# Patient Record
Sex: Female | Born: 1945
Health system: Southern US, Community
[De-identification: ages and names within clinical notes are randomized; demographics above are authoritative.]

## PROBLEM LIST (undated history)

## (undated) DIAGNOSIS — I779 Disorder of arteries and arterioles, unspecified: Secondary | ICD-10-CM

## (undated) DIAGNOSIS — R112 Nausea with vomiting, unspecified: Secondary | ICD-10-CM

## (undated) DIAGNOSIS — M1990 Unspecified osteoarthritis, unspecified site: Secondary | ICD-10-CM

## (undated) DIAGNOSIS — E039 Hypothyroidism, unspecified: Secondary | ICD-10-CM

## (undated) DIAGNOSIS — E785 Hyperlipidemia, unspecified: Secondary | ICD-10-CM

## (undated) DIAGNOSIS — C50919 Malignant neoplasm of unspecified site of unspecified female breast: Secondary | ICD-10-CM

## (undated) DIAGNOSIS — K219 Gastro-esophageal reflux disease without esophagitis: Secondary | ICD-10-CM

## (undated) DIAGNOSIS — H409 Unspecified glaucoma: Secondary | ICD-10-CM

## (undated) DIAGNOSIS — I739 Peripheral vascular disease, unspecified: Secondary | ICD-10-CM

## (undated) DIAGNOSIS — I2089 Other forms of angina pectoris: Secondary | ICD-10-CM

## (undated) DIAGNOSIS — I10 Essential (primary) hypertension: Secondary | ICD-10-CM

## (undated) DIAGNOSIS — T4145XA Adverse effect of unspecified anesthetic, initial encounter: Secondary | ICD-10-CM

## (undated) DIAGNOSIS — M353 Polymyalgia rheumatica: Secondary | ICD-10-CM

## (undated) HISTORY — DX: Hyperlipidemia, unspecified: E78.5

## (undated) HISTORY — DX: Polymyalgia rheumatica: M35.3

## (undated) HISTORY — DX: Gastro-esophageal reflux disease without esophagitis: K21.9

## (undated) HISTORY — DX: Malignant neoplasm of unspecified site of unspecified female breast: C50.919

## (undated) HISTORY — DX: Unspecified osteoarthritis, unspecified site: M19.90

## (undated) HISTORY — DX: Disorder of arteries and arterioles, unspecified: I77.9

## (undated) HISTORY — DX: Hypothyroidism, unspecified: E03.9

## (undated) HISTORY — DX: Unspecified glaucoma: H40.9

## (undated) HISTORY — DX: Other forms of angina pectoris: I20.89

## (undated) HISTORY — DX: Peripheral vascular disease, unspecified: I73.9

## (undated) HISTORY — DX: Essential (primary) hypertension: I10

## (undated) HISTORY — PX: FINGER SURGERY: SHX640

---
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
## 2000-08-30 HISTORY — PX: ENDOMETRIAL ABLATION: SHX621

## 2003-05-31 ENCOUNTER — Encounter: Payer: Self-pay | Admitting: Family Medicine

## 2003-05-31 ENCOUNTER — Ambulatory Visit (HOSPITAL_COMMUNITY): Admission: RE | Admit: 2003-05-31 | Discharge: 2003-05-31 | Payer: Self-pay | Admitting: Family Medicine

## 2003-09-19 ENCOUNTER — Ambulatory Visit (HOSPITAL_COMMUNITY): Admission: RE | Admit: 2003-09-19 | Discharge: 2003-09-19 | Payer: Self-pay | Admitting: Family Medicine

## 2004-09-15 ENCOUNTER — Ambulatory Visit (HOSPITAL_COMMUNITY): Admission: RE | Admit: 2004-09-15 | Discharge: 2004-09-15 | Payer: Self-pay | Admitting: Family Medicine

## 2005-09-16 ENCOUNTER — Ambulatory Visit (HOSPITAL_COMMUNITY): Admission: RE | Admit: 2005-09-16 | Discharge: 2005-09-16 | Payer: Self-pay | Admitting: Family Medicine

## 2006-06-03 ENCOUNTER — Other Ambulatory Visit: Admission: RE | Admit: 2006-06-03 | Discharge: 2006-06-03 | Payer: Self-pay | Admitting: Obstetrics and Gynecology

## 2006-09-09 ENCOUNTER — Ambulatory Visit (HOSPITAL_COMMUNITY): Admission: RE | Admit: 2006-09-09 | Discharge: 2006-09-09 | Payer: Self-pay | Admitting: Family Medicine

## 2007-02-15 ENCOUNTER — Ambulatory Visit (HOSPITAL_COMMUNITY): Admission: RE | Admit: 2007-02-15 | Discharge: 2007-02-15 | Payer: Self-pay | Admitting: Family Medicine

## 2007-08-08 ENCOUNTER — Other Ambulatory Visit: Admission: RE | Admit: 2007-08-08 | Discharge: 2007-08-08 | Payer: Self-pay | Admitting: Obstetrics and Gynecology

## 2007-08-15 ENCOUNTER — Ambulatory Visit: Payer: Self-pay | Admitting: Internal Medicine

## 2007-08-15 ENCOUNTER — Ambulatory Visit (HOSPITAL_COMMUNITY): Admission: RE | Admit: 2007-08-15 | Discharge: 2007-08-15 | Payer: Self-pay | Admitting: Internal Medicine

## 2007-09-11 ENCOUNTER — Ambulatory Visit (HOSPITAL_COMMUNITY): Admission: RE | Admit: 2007-09-11 | Discharge: 2007-09-11 | Payer: Self-pay | Admitting: Family Medicine

## 2008-08-30 DIAGNOSIS — C50919 Malignant neoplasm of unspecified site of unspecified female breast: Secondary | ICD-10-CM

## 2008-08-30 DIAGNOSIS — Z923 Personal history of irradiation: Secondary | ICD-10-CM

## 2008-08-30 HISTORY — PX: BASAL CELL CARCINOMA EXCISION: SHX1214

## 2008-08-30 HISTORY — DX: Personal history of irradiation: Z92.3

## 2008-08-30 HISTORY — DX: Malignant neoplasm of unspecified site of unspecified female breast: C50.919

## 2008-08-30 HISTORY — PX: BREAST LUMPECTOMY: SHX2

## 2008-09-16 ENCOUNTER — Ambulatory Visit (HOSPITAL_COMMUNITY): Admission: RE | Admit: 2008-09-16 | Discharge: 2008-09-16 | Payer: Self-pay | Admitting: Internal Medicine

## 2008-09-25 ENCOUNTER — Encounter (INDEPENDENT_AMBULATORY_CARE_PROVIDER_SITE_OTHER): Payer: Self-pay | Admitting: Diagnostic Radiology

## 2008-09-25 ENCOUNTER — Encounter: Admission: RE | Admit: 2008-09-25 | Discharge: 2008-09-25 | Payer: Self-pay | Admitting: Internal Medicine

## 2008-10-03 ENCOUNTER — Ambulatory Visit (HOSPITAL_COMMUNITY): Admission: RE | Admit: 2008-10-03 | Discharge: 2008-10-03 | Payer: Self-pay | Admitting: Internal Medicine

## 2008-10-24 ENCOUNTER — Ambulatory Visit (HOSPITAL_COMMUNITY): Admission: RE | Admit: 2008-10-24 | Discharge: 2008-10-24 | Payer: Self-pay | Admitting: General Surgery

## 2008-10-24 ENCOUNTER — Encounter (INDEPENDENT_AMBULATORY_CARE_PROVIDER_SITE_OTHER): Payer: Self-pay | Admitting: General Surgery

## 2008-11-08 ENCOUNTER — Ambulatory Visit (HOSPITAL_COMMUNITY): Payer: Self-pay | Admitting: Oncology

## 2008-11-21 ENCOUNTER — Ambulatory Visit: Admission: RE | Admit: 2008-11-21 | Discharge: 2009-01-23 | Payer: Self-pay | Admitting: Radiation Oncology

## 2009-05-09 ENCOUNTER — Ambulatory Visit (HOSPITAL_COMMUNITY): Payer: Self-pay | Admitting: Oncology

## 2009-08-01 ENCOUNTER — Ambulatory Visit (HOSPITAL_COMMUNITY): Admission: RE | Admit: 2009-08-01 | Discharge: 2009-08-01 | Payer: Self-pay | Admitting: Oncology

## 2009-09-03 ENCOUNTER — Ambulatory Visit (HOSPITAL_COMMUNITY): Admission: RE | Admit: 2009-09-03 | Discharge: 2009-09-03 | Payer: Self-pay | Admitting: Internal Medicine

## 2009-11-04 ENCOUNTER — Encounter (INDEPENDENT_AMBULATORY_CARE_PROVIDER_SITE_OTHER): Payer: Self-pay | Admitting: *Deleted

## 2009-11-04 LAB — CONVERTED CEMR LAB
ALT: 52 units/L
Albumin: 4.4 g/dL
Alkaline Phosphatase: 87 units/L
Chloride: 100 meq/L
Cholesterol: 161 mg/dL
GFR calc non Af Amer: >60 mL/min
Glomerular Filtration Rate, Af Am: >60 mL/min/{1.73_m2}
HCT: 42.6 %
HDL: 51 mg/dL
LDL Cholesterol: 82 mg/dL
MCV: 89.5 fL
Potassium: 4.4 meq/L
Sodium: 139 meq/L
TSH: 1.057 microintl units/mL
Total Protein: 8 g/dL

## 2009-11-25 ENCOUNTER — Ambulatory Visit (HOSPITAL_COMMUNITY): Payer: Self-pay | Admitting: Oncology

## 2009-11-25 ENCOUNTER — Encounter (HOSPITAL_COMMUNITY): Admission: RE | Admit: 2009-11-25 | Discharge: 2009-12-25 | Payer: Self-pay | Admitting: Oncology

## 2009-11-26 DIAGNOSIS — E039 Hypothyroidism, unspecified: Secondary | ICD-10-CM | POA: Insufficient documentation

## 2009-11-26 DIAGNOSIS — I10 Essential (primary) hypertension: Secondary | ICD-10-CM | POA: Insufficient documentation

## 2009-11-27 ENCOUNTER — Ambulatory Visit: Payer: Self-pay | Admitting: Cardiology

## 2009-11-27 ENCOUNTER — Encounter (INDEPENDENT_AMBULATORY_CARE_PROVIDER_SITE_OTHER): Payer: Self-pay | Admitting: *Deleted

## 2009-11-27 DIAGNOSIS — E785 Hyperlipidemia, unspecified: Secondary | ICD-10-CM | POA: Insufficient documentation

## 2009-11-27 DIAGNOSIS — M79609 Pain in unspecified limb: Secondary | ICD-10-CM | POA: Insufficient documentation

## 2009-11-27 DIAGNOSIS — D0590 Unspecified type of carcinoma in situ of unspecified breast: Secondary | ICD-10-CM | POA: Insufficient documentation

## 2009-12-23 ENCOUNTER — Ambulatory Visit (HOSPITAL_COMMUNITY): Admission: RE | Admit: 2009-12-23 | Discharge: 2009-12-23 | Payer: Self-pay | Admitting: Cardiology

## 2009-12-23 ENCOUNTER — Encounter: Payer: Self-pay | Admitting: Cardiology

## 2009-12-23 ENCOUNTER — Ambulatory Visit: Payer: Self-pay | Admitting: Cardiology

## 2009-12-29 ENCOUNTER — Telehealth (INDEPENDENT_AMBULATORY_CARE_PROVIDER_SITE_OTHER): Payer: Self-pay | Admitting: *Deleted

## 2010-01-08 ENCOUNTER — Encounter: Payer: Self-pay | Admitting: Cardiology

## 2010-01-08 LAB — CONVERTED CEMR LAB
ALT: 45 units/L — ABNORMAL HIGH (ref 0–35)
AST: 32 units/L (ref 0–37)
BUN: 19 mg/dL (ref 6–23)
Calcium: 9.4 mg/dL (ref 8.4–10.5)
Total Bilirubin: 0.4 mg/dL (ref 0.3–1.2)

## 2010-01-12 ENCOUNTER — Encounter: Payer: Self-pay | Admitting: Cardiology

## 2010-01-12 ENCOUNTER — Telehealth (INDEPENDENT_AMBULATORY_CARE_PROVIDER_SITE_OTHER): Payer: Self-pay | Admitting: *Deleted

## 2010-01-12 ENCOUNTER — Encounter (INDEPENDENT_AMBULATORY_CARE_PROVIDER_SITE_OTHER): Payer: Self-pay | Admitting: *Deleted

## 2010-01-28 ENCOUNTER — Encounter (INDEPENDENT_AMBULATORY_CARE_PROVIDER_SITE_OTHER): Payer: Self-pay | Admitting: *Deleted

## 2010-01-28 ENCOUNTER — Ambulatory Visit: Payer: Self-pay | Admitting: Cardiology

## 2010-01-28 DIAGNOSIS — R9431 Abnormal electrocardiogram [ECG] [EKG]: Secondary | ICD-10-CM | POA: Insufficient documentation

## 2010-01-28 DIAGNOSIS — E663 Overweight: Secondary | ICD-10-CM | POA: Insufficient documentation

## 2010-01-28 DIAGNOSIS — R252 Cramp and spasm: Secondary | ICD-10-CM | POA: Insufficient documentation

## 2010-01-30 ENCOUNTER — Encounter (INDEPENDENT_AMBULATORY_CARE_PROVIDER_SITE_OTHER): Payer: Self-pay | Admitting: *Deleted

## 2010-01-30 LAB — CONVERTED CEMR LAB
Cholesterol: 157 mg/dL (ref 0–200)
HDL: 42 mg/dL
HDL: 42 mg/dL (ref >39)
Magnesium: 2.2 mg/dL
VLDL: 27 mg/dL (ref 0–40)

## 2010-02-03 ENCOUNTER — Ambulatory Visit: Payer: Self-pay | Admitting: Cardiology

## 2010-02-03 ENCOUNTER — Ambulatory Visit (HOSPITAL_COMMUNITY): Admission: RE | Admit: 2010-02-03 | Discharge: 2010-02-03 | Payer: Self-pay | Admitting: Cardiology

## 2010-02-03 ENCOUNTER — Encounter: Payer: Self-pay | Admitting: Cardiology

## 2010-02-05 ENCOUNTER — Telehealth (INDEPENDENT_AMBULATORY_CARE_PROVIDER_SITE_OTHER): Payer: Self-pay | Admitting: *Deleted

## 2010-02-18 ENCOUNTER — Encounter (INDEPENDENT_AMBULATORY_CARE_PROVIDER_SITE_OTHER): Payer: Self-pay | Admitting: *Deleted

## 2010-03-25 ENCOUNTER — Encounter (INDEPENDENT_AMBULATORY_CARE_PROVIDER_SITE_OTHER): Payer: Self-pay | Admitting: *Deleted

## 2010-03-30 ENCOUNTER — Ambulatory Visit: Payer: Self-pay | Admitting: Cardiology

## 2010-05-08 ENCOUNTER — Ambulatory Visit (HOSPITAL_COMMUNITY): Payer: Self-pay | Admitting: Oncology

## 2010-09-09 ENCOUNTER — Ambulatory Visit (HOSPITAL_COMMUNITY)
Admission: RE | Admit: 2010-09-09 | Discharge: 2010-09-09 | Payer: Self-pay | Source: Home / Self Care | Attending: Internal Medicine | Admitting: Internal Medicine

## 2010-09-20 ENCOUNTER — Encounter: Payer: Self-pay | Admitting: Family Medicine

## 2010-09-20 ENCOUNTER — Encounter: Payer: Self-pay | Admitting: Internal Medicine

## 2010-09-27 LAB — CONVERTED CEMR LAB
Alkaline Phosphatase: 87 units/L
Calcium: 8.9 mg/dL
Chloride: 100 meq/L
Cholesterol: 161 mg/dL
Creatinine, Ser: 0.83 mg/dL
Free T4: 1.29 ng/dL
HCT: 42.6 %
HDL: 51 mg/dL
LDL Cholesterol: 82 mg/dL
MCV: 89.5 fL
Platelets: 307 10*3/uL
TSH: 1.057 microintl units/mL
Triglycerides: 138 mg/dL
WBC: 6.4 10*3/uL

## 2010-09-29 NOTE — Assessment & Plan Note (Signed)
Summary: **PER DR.NEIJSTROM FOR LEFT ARM PAIN/FAMILY HX/TG   Visit Type:  Initial Consult Referring Provider:  Dr. Mariel Sleet Primary Provider:  Sabino Snipes   History of Present Illness: [name] is seen for an initial visit at the kind request of Dr. Mariel Sleet for evaluation of left arm discomfort.  This nice woman has no history of cardiovascular disease other than hypertension, which has been well-controlled.  She has not previously been seen by a cardiologist nor has she undergone any significant cardiac testing.  She generally has good exercise tolerance without exertional dyspnea or chest discomfort.  On 2 occasions, she has noted pain extending down the dorsal surface of the left arm.  This was of moderate intensity.  There were no associated symptoms.  She is a bit unclear as to the details.  She is not certain whether motion of the arm exacerbated the discomfort.  Symptoms resolved spontaneously over the course of an hour or so.  Recent blood tests showed mild elevation of SGOT and SGPT.  Concern has been raised as to whether this represents metastatic carcinoma of the breast.  An imaging study of the abdomen is planned.  Patient is being treated for hyperlipidemia, but is not following a specific diet.  She has been restricting calories in order to lose weight in recent days because she is concerned about the possibility of steatohepatitis.  She has never been instructed in a low-fat diet.     EKG  Procedure date:  13-Dec-2009  Findings:      Normal sinus rhythm Left atrial abnormality Delayed R-wave progression Possible left ventricular hypertrophy No change when compared to a previous tracing of 11/25/09   Current Medications (verified): 1)  Levothroid 112 Mcg Tabs (Levothyroxine Sodium) .... Take 1 Tab Daily 2)  Lisinopril 20 Mg Tabs (Lisinopril) .... Take 1 Tab Daily 3)  Amlodipine Besylate 5 Mg Tabs (Amlodipine Besylate) .... Take 1 Tab Daily 4)  Evista 60 Mg Tabs  (Raloxifene Hcl) .... Take 1 Tab Daily 5)  Fexofenadine Hcl 60 Mg Tabs (Fexofenadine Hcl) .... Take Prn 6)  Vitamin D 400 Unit Tabs (Cholecalciferol) .... Take 1 Tab Daily 7)  Citracal Plus  Tabs (Multiple Minerals-Vitamins) .... Take 1 Tab Daily 8)  Aspir-Low 81 Mg Tbec (Aspirin) .... Take 1 Tab Daily 9)  Systane Preservative Free 0.4-0.3 % Soln (Polyethyl Glycol-Propyl Glycol) .... Use 1 To 3 Times Daily 10)  Azasite 1 % Soln (Azithromycin) .... Use 1 Drop in Both Eyes Once Daily  Allergies (verified): 1)  ! Sulfa 2)  ! Penicillin 3)  ! 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)-excision of tumor followed by radiation therapy in 2010 HYPOTHYROIDISM (ICD-244.9) HYPERTENSION (ICD-401.9) Hyperlipidemia  Past Surgical History: Minor surgical intervention following a crush injury to the left fourth finger Endometrial ablation-2002 Excision of carcinoma of the right breast-2010  Family History: Father died at age 97 due to congestive heart failure Mother died at age 32 with pneumonia and a history of Parkinson's disease  Social History: Retired-previously worked at [employer] with no children No excessive use of alcohol Tobacco-never  Review of Systems       Corrective lenses required  for near and far vision.  Patient has undergone bilateral cataract extraction.  She has a history of colitis.  She notes arthritic discomfort, particularly in the knees and hips.  She has intermittent ankle edema.  All other systems reviewed and are negative.  Vital Signs:  Patient profile:   65 year old  female Height:      65 inches Weight:      189 pounds BMI:     31.56 Pulse rate:   72 / minute BP sitting:   118 / 72  (right arm)  Vitals Entered By: Dreama Saa, CNA (November 27, 2009 1:18 PM)  Physical Exam  General:    Overweight; well-developed; no acute distress: HEENT-[place]/AT; PERRL; EOM intact; conjunctiva and lids nl; plethoric complexion Neck-No JVD; no carotid bruits: Endocrine-No thyromegaly: Lungs-No tachypnea, clear without rales, rhonchi or wheezes: CV-normal PMI; normal S1 and S2; modest systolic ejection murmur  Abdomen-BS normal; soft and non-tender without masses or organomegaly: MS-No deformities, cyanosis or clubbing: Neurologic-Nl cranial nerves; symmetric strength and tone: Skin- Warm, no sig. lesions: Extremities-Nl distal pulses;1+ left ankle edema    Impression & Recommendations:  Problem # 1:  ARM PAIN, LEFT (ICD-729.5) With only 2 episodes of arm discomfort, this is a very nonspecific symptom.  We will proceed with a stress echocardiogram to exclude myocardial ischemia.  Problem # 2:  HYPERTENSION (ICD-401.9) EKG is abnormal only in so far as there is evidence of LVH.  An echocardiogram will be obtained to evaluate this possibility and to rule out segmental or global left ventricular dysfunction.  Problem # 3:  HYPERLIPIDEMIA (ICD-272.4) We are seeking recent laboratories to evaluate the adequacy of control of hyperlipidemia.  Problem # 4:  ELEVATION OF TRANSAMINASES (ICD-790.4) The most likely etiology for her mild elevation of transaminases is treatment with a statin.  Pravastatin will be held for the next 2 months, and a repeat hepatic profile will be obtained in 6 weeks  I will reassess this nice woman at a return office visit in 2 months.  Other Orders: Stress Echo (Stress Echo) Future Orders: T-Comprehensive Metabolic Panel ([id number]) ... 01/08/2010  Patient Instructions: 1)  Your physician recommends that you schedule a follow-up  appointment in: 2  months 2)  Your physician recommends that you return for lab work in: 6 weeks 3)  Your physician has recommended you make the following change in your medication: stop pravachol 4)  Your physician recommends a low cholesterol, low fat diet. Please see [facility] handout. 5)  Your physician has requested that you have a stress echocardiogram. For further information please visit [email or web address]  Please follow instruction sheet as given.

## 2010-09-29 NOTE — Progress Notes (Signed)
Summary: Results  Phone Note Call from Patient   Caller: Patient Reason for Call: Lab or Test Results Summary of Call: pt would like results of labwork, carotids and 2 D echo/tg Initial call taken by: Raechel Ache [facility],  February 05, 2010 10:43 AM  Follow-up for Phone Call        I spoke with pt and gave conclusions to echo and carotid duplex, and labs, pt verbalized understanding.  Also sent letter and copies of all tests per pt request.  Fsxed copies to Dr Ian Malkin Hall,MD Follow-up by: Teressa Lower RN,  February 06, 2010 12:03 PM

## 2010-09-29 NOTE — Letter (Signed)
Summary: [facility] Work Engineer, agricultural at [employer]  [address], [place] [address]   Phone: [phone]  Fax: [phone]     January 28, 2010 MRN: [id number]   Maria Ritter [address] [place], [place]  [address]      YOUR LAB WORK IS DUE   ________FRIDAY JUNE 3, 2011_________________________________  Please go to [facility], located across the street from [facility] on the second floor.  Hours are Monday - Friday 7am until 7:30pm         Saturday 8am until 12noon    _X_  DO NOT EAT OR DRINK AFTER MIDNIGHT EVENING PRIOR TO LABWORK  __ YOUR LABWORK IS NOT FASTING --YOU MAY EAT PRIOR TO LABWORK

## 2010-09-29 NOTE — Miscellaneous (Signed)
Summary: LABS LIPID,MAGNESIUM,01/30/2010  Clinical Lists Changes  Observations: Added new observation of MAGNESIUM: 2.2 mg/dL (16/05/9603 54:09) Added new observation of LDL: 88 mg/dL (81/19/1478 29:56) Added new observation of HDL: 42 mg/dL (21/30/8657 84:69) Added new observation of TRIGLYC TOT: 135 mg/dL (62/95/2841 32:44) Added new observation of CHOLESTEROL: 157 mg/dL (09/01/7251 66:44)

## 2010-09-29 NOTE — Letter (Signed)
Summary: [place] Results Engineer, agricultural at [facility]  [address] [place] [address]   Phone: [phone]  Fax: [phone]      February 18, 2010 MRN: [id number]   Maria Ritter [address] [place], [place]  [address]   Dear [name],  Your test ordered by Selena Batten has been reviewed by your physician (or physician assistant) and was found to be normal or stable. Your physician (or physician assistant) felt no changes were needed at this time.  __x__ Echocardiogram  ____ Cardiac Stress Test  ____ Lab Work  __x__ Peripheral vascular study of arms, legs or neck  ____ CT scan or X-ray  ____ Lung or Breathing test  ____ Other:  No change in medical treatment at this time, per Dr. Dietrich Pates.  Enclosed is a copy of your test for your records per your request.  Thank you, Maven Rosander RN    Mathews Bing, MD, Lenise Arena.C.Gaylord Shih, MD, F.A.C.C Lewayne Bunting, MD, F.A.C.C Nona Dell, MD, F.A.C.C Charlton Haws, MD, Lenise Arena.C.C

## 2010-09-29 NOTE — Assessment & Plan Note (Signed)
Summary: 2 mth f/u per checkout on 11/27/09/tg   Visit Type:  Follow-up Referring Provider:  Dr. Mariel Sleet Primary Provider:  Sabino Snipes   History of Present Illness: Return visit for this pleasant 65 year old woman initially referred to me for evaluation of left arm discomfort.  She has hypertension and hyperlipidemia but no known vascular disease.  She has done well since her last visit, losing nearly 20 pounds due to caloric restriction and experiencing no further symptoms suggestive of cardiovascular disease.  Ms. Kirstie Peri describes a number of unrelated symptoms.  She notes leg cramps nocturnally and wonders whether quinine or quinidine treatment would be appropriate.  She has developed a rash over the right forearm.  Current Medications (verified): 1)  Levothroid 112 Mcg Tabs (Levothyroxine Sodium) .... Take 1 Tab Daily 2)  Lisinopril 20 Mg Tabs (Lisinopril) .... Take 1 Tab Daily 3)  Amlodipine Besylate 5 Mg Tabs (Amlodipine Besylate) .... Take 1 Tab Daily 4)  Evista 60 Mg Tabs (Raloxifene Hcl) .... Take 1 Tab Daily 5)  Fexofenadine Hcl 60 Mg Tabs (Fexofenadine Hcl) .... Take Prn 6)  Vitamin D 400 Unit Tabs (Cholecalciferol) .... Take 1 Tab Daily 7)  Citracal Plus  Tabs (Multiple Minerals-Vitamins) .... Take 1 Tab Daily 8)  Aspir-Low 81 Mg Tbec (Aspirin) .... Take 1 Tab Daily 9)  Systane Preservative Free 0.4-0.3 % Soln (Polyethyl Glycol-Propyl Glycol) .... Use 1 To 3 Times Daily 10)  Benadryl Maximum Strength 2 % Crea (Diphenhydramine Hcl) .... Use Prn  Allergies (verified): 1)  ! Sulfa 2)  ! Penicillin 3)  ! Erythromycin  Past History:  PMH, FH, and Social History reviewed and updated.  Past Medical History: Left arm discomfort ?  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  The patient denies chest pain, syncope, dyspnea on exertion, peripheral edema, prolonged cough, headaches, abdominal pain, and hematochezia.    Vital Signs:  Patient profile:   65 year old female Weight:      172 pounds Pulse rate:   64 / minute BP sitting:   110 / 64  (right arm)  Vitals Entered By: Dreama Saa, CNA (January 28, 2010 11:20 AM)  Physical Exam  General:    Overweight; well-developed; no acute distress: Neck-No JVD; no carotid bruits: Endocrine-No thyromegaly: Lungs-No tachypnea, clear without rales, rhonchi or wheezes: CV-normal PMI; normal S1 and S2; modest systolic ejection murmur  Abdomen-BS normal; soft and non-tender without masses or organomegaly: MS-No deformities, cyanosis or clubbing: Neurologic-Nl cranial nerves; symmetric strength and tone: Skin- Warm, maculopapular rash over the right forearm with minor excoriations Extremities-Nl distal pulses;trace left ankle edema    Echocardiogram  Procedure date:  12/23/2009  Findings:       CONCLUSION:   1. Negative exercise treadmill test by standard criteria at a maximum       workload of 10.1 METs.  No chest pain reported.  Peak blood       pressure 148/72.  No inducible arrhythmias.   2. Technically nondiagnostic echocardiographic findings.  Left       ventricular systolic function is normal at rest and there are no       clearly inducible wall motion abnormalities, however, the peak       images were obtained at submaximal heart rate in  light of rapid       reduction in heart rate from peak.               Jonelle Sidle, MD      Impression & Recommendations:  Problem # 1:  ? CEREBROVASCULAR DISEASE (ICD-437.9) The quality of her previous vascular study is uncertain.  If she does have cerebrovascular disease, aggressive therapy to lower serum lipids will definitely be appropriate.  Accordingly, a carotid duplex study will  be obtained.  Problem # 2:  ELEVATION OF TRANSAMINASES (ICD-790.4) LFT abnormalities are improved, now with a normal SGOT and minimal elevation of SGPT.  This suggests that treatment with a statin may well be the problem.  I see no imaging studies of her liver.  An ultrasound or CT scan, if negative, would be reassuring.  The decision whether or not to restart a statin will be based upon her carotid ultrasound study and fasting lipid profile off treatment.  Problem # 3:  NONSPECIFIC SKIN ERUPTION (ICD-782.1) Patient has a dermatitis over the right forearm that developed while she was mowing a heavily vegetated area.  She believes there was a central insect sting, but contact dermatitis is certainly a possibility.  She will use over-the-counter steroid cream.  Problem # 4:  ARM PAIN, LEFT (ICD-729.5) Stress echocardiogram was negative, suggesting the absence of coronary disease.  Since she is currently asymptomatic, no further testing is warranted.  Problem # 5:  HYPERLIPIDEMIA (ICD-272.4) Lipid profile obtained from Dr. Scharlene Gloss office was likely taken on drug treatment and showed total cholesterol 161, triglycerides of 138, HDL 51 and LDL of 82.  We will compare a fasting lipid profile taken now after refraining from treatment for one month.  Problem # 6:  LEG CRAMPS, NOCTURNAL (ICD-729.82) Exercises were provided to the patient for this problem.  Stretching of the gastrocnemius prior to retiring for bed has been reported to be beneficial.  A magnesium level will be checked.  Patient was assured that these symptoms are likely benign.  Problem # 7:  HYPERTENSION (ICD-401.9) Echocardiogram was inadvertently not obtained and will be reordered.  This will assess the degree of LVH and rule out other structural heart disease.  I will reassess this nice woman in 2 months.  Problem # 8:  OVERWEIGHT (ICD-278.02) Patient congratulated on achieving a 17 pound weight loss.  Other Orders: Carotid Duplex  (Carotid Duplex) 2-D Echocardiogram (2D Echo) Future Orders: T-Lipid Profile ([id number]) ... 01/30/2010 T-Magnesium [phone]) ... 01/30/2010  Patient Instructions: 1)  Your physician recommends that you schedule a follow-up appointment in: 2 MONTHS 2)  Your physician recommends that you return for lab work in: FRIDAY 3)  Your physician has requested that you have a carotid duplex. This test is an ultrasound of the carotid arteries in your neck. It looks at blood flow through these arteries that supply the brain with blood. Allow one hour for this exam. There are no restrictions or special instructions. 4)  Your physician has requested that you have an echocardiogram.  Echocardiography is a painless test that uses sound waves to create images of your heart. It provides your doctor with information about the size and shape of your heart and how well your heart's chambers and valves are working.  This procedure takes approximately one hour. There are no restrictions for this procedure.

## 2010-09-29 NOTE — Progress Notes (Signed)
Summary: lab results  Phone Note Call from Patient   Caller: Patient Reason for Call: Lab or Test Results Summary of Call: pt wants lab results/states to pls call after 12 noon/tg Initial call taken by: Raechel Ache [facility],  Jan 12, 2010 8:07 AM  Follow-up for Phone Call        results called to pt, faxed to Dr. Scharlene Gloss office and a letter and copy of labwork was mailed to pt Follow-up by: Teressa Lower RN,  Jan 12, 2010 12:23 PM

## 2010-09-29 NOTE — Letter (Signed)
Summary:  Results Engineer, agricultural at [facility]  [address], [place] [address]   Phone: [phone]  Fax: [phone]      Jan 12, 2010 MRN: [id number]   Maria Ritter [address] [place], [place]  [address]   Dear [name],  Your test ordered by Selena Batten has been reviewed by your physician (or physician assistant) and was found to be normal or stable. Your physician (or physician assistant) felt no changes were needed at this time.  ____ Echocardiogram  ____ Cardiac Stress Test  __X__ Lab Work  ____ Peripheral vascular study of arms, legs or neck  ____ CT scan or X-ray  ____ Lung or Breathing test  ____ Other: Please continue on current medical treatment.  Thank you.  Silverton Bing, MD, F.A.C.C

## 2010-09-29 NOTE — Miscellaneous (Signed)
Summary: CAROTID 02/03/2010,ECHO 02/03/2010   Clinical Lists Changes  Observations: Added new observation of ECHOINTERP:   Study Conclusions    - Left ventricle: The cavity size was normal. There was mild focal     basal hypertrophy of the septum. Systolic function was normal. The     estimated ejection fraction was in the range of 55% to 60%. Wall     motion was normal; there were no regional wall motion     abnormalities.   - Mitral valve: Calcified annulus.   - Pulmonary arteries: PA peak pressure: 31mm Hg (S).   Transthoracic echocardiography. M-mode, complete 2D, spectral   Doppler, and color Doppler. Height: Height: 165.1cm. Height: 65in.   Weight: Weight: 77.1kg. Weight: 169.6lb. Body mass index: BMI:   28.3kg/m^2. Body surface area: BSA: 1.54m^2. Patient status:   Outpatient. Location: Echo laboratory.    --------------------------------------------------------------------  (02/03/2010 17:23) Added new observation of US CAROTID:  Clinical Data: Carotid atherosclerosis by a screening exam    BILATERAL CAROTID DUPLEX ULTRASOUND    Technique: Gray scale imaging, color Doppler and duplex ultrasound   was performed of bilateral carotid and vertebral arteries in the   neck.    Comparison: None.    Criteria:  Quantification of carotid stenosis is based on velocity   parameters that correlate the residual internal carotid diameter   with NASCET-based stenosis levels, using the diameter of the distal   internal carotid lumen as the denominator for stenosis measurement.    The following velocity measurements were obtained:                     PEAK SYSTOLIC/END DIASTOLIC   RIGHT   ICA:                        80/30cm/sec   CCA:                        77/13cm/sec   SYSTOLIC ICA/CCA RATIO:     1.16   DIASTOLIC ICA/CCA RATIO:    1.85   ECA:                        92/11cm/sec    LEFT   ICA:                        122/46cm/sec   CCA:                        77/16cm/sec   SYSTOLIC  ICA/CCA RATIO:     1.48   DIASTOLIC ICA/CCA RATIO:    3.04   ECA:                        76/12cm/sec    Findings:    RIGHT CAROTID ARTERY: Very minimal right carotid bifurcation   intimal thickening and atherosclerosis.  No hemodynamically   significant ICA stenosis, velocity elevation, or turbulent flow.    RIGHT VERTEBRAL ARTERY:  Antegrade    LEFT CAROTID ARTERY: Very minimal left carotid bifurcation   atherosclerosis and intimal thickening.  No hemodynamically   significant ICA stenosis, velocity elevation, or turbulent flow.    LEFT VERTEBRAL ARTERY:  Antegrade    IMPRESSION:   Minimal carotid atherosclerosis.  No hemodynamically significant   ICA stenosis on either side    Read By:  Sigurd Sos.,  M.D.   Released By:  Sigurd Sos.,  M.D.  Additional Information  HL7 RESULT STATUS : F  External image : [id number]  External IF Update Timestamp : 2010-02-03:10:27:38.000000  (01/30/2010 17:22)      Carotid Doppler  Procedure date:  01/30/2010  Findings:       Clinical Data: Carotid atherosclerosis by a screening exam    BILATERAL CAROTID DUPLEX ULTRASOUND    Technique: Wallace Cullens scale imaging, color Doppler and duplex ultrasound   was performed of bilateral carotid and vertebral arteries in the   neck.    Comparison: None.    Criteria:  Quantification of carotid stenosis is based on velocity   parameters that correlate the residual internal carotid diameter   with NASCET-based stenosis levels, using the diameter of the distal   internal carotid lumen as the denominator for stenosis measurement.    The following velocity measurements were obtained:                     PEAK SYSTOLIC/END DIASTOLIC   RIGHT   ICA:                        80/30cm/sec   CCA:                        77/13cm/sec   SYSTOLIC ICA/CCA RATIO:     1.16   DIASTOLIC ICA/CCA RATIO:    1.85   ECA:                        92/11cm/sec    LEFT   ICA:                         122/46cm/sec   CCA:                        77/16cm/sec   SYSTOLIC ICA/CCA RATIO:     1.48   DIASTOLIC ICA/CCA RATIO:    3.04   ECA:                        76/12cm/sec    Findings:    RIGHT CAROTID ARTERY: Very minimal right carotid bifurcation   intimal thickening and atherosclerosis.  No hemodynamically   significant ICA stenosis, velocity elevation, or turbulent flow.    RIGHT VERTEBRAL ARTERY:  Antegrade    LEFT CAROTID ARTERY: Very minimal left carotid bifurcation   atherosclerosis and intimal thickening.  No hemodynamically   significant ICA stenosis, velocity elevation, or turbulent flow.    LEFT VERTEBRAL ARTERY:  Antegrade    IMPRESSION:   Minimal carotid atherosclerosis.  No hemodynamically significant   ICA stenosis on either side    Read By:  Sigurd Sos.,  M.D.   Released By:  Sigurd Sos.,  M.D.  Additional Information  HL7 RESULT STATUS : F  External image : [id number]  External IF Update Timestamp : 2010-02-03:10:27:38.000000   Echocardiogram  Procedure date:  02/03/2010  Findings:        Study Conclusions    - Left ventricle: The cavity size was normal. There was mild focal     basal hypertrophy of the septum. Systolic function was normal. The     estimated ejection fraction was in the range of 55% to 60%. Wall  motion was normal; there were no regional wall motion     abnormalities.   - Mitral valve: Calcified annulus.   - Pulmonary arteries: PA peak pressure: 31mm Hg (S).   Transthoracic echocardiography. M-mode, complete 2D, spectral   Doppler, and color Doppler. Height: Height: 165.1cm. Height: 65in.   Weight: Weight: 77.1kg. Weight: 169.6lb. Body mass index: BMI:   28.3kg/m^2. Body surface area: BSA: 1.45m^2. Patient status:   Outpatient. Location: Echo laboratory.    --------------------------------------------------------------------

## 2010-09-29 NOTE — Letter (Signed)
Summary: [name] Results Engineer, agricultural at [facility]  [address] [place] [address]   Phone: [phone]  Fax: [phone]      Jan 12, 2010 MRN: [id number]   Maria Ritter [address] [place], [place]  [address]   Dear [name],  Your test ordered by Selena Batten has been reviewed by your physician (or physician assistant) and was found to be normal or stable. Your physician (or physician assistant) felt no changes were needed at this time.  ____ Echocardiogram  ____ Cardiac Stress Test  __x__ Lab Work  ____ Peripheral vascular study of arms, legs or neck  ____ CT scan or X-ray  ____ Lung or Breathing test  ____ Other:  No change in medical treatment at this time, per Dr. Dietrich Pates.  Enclosed is a copy of your labwork for your records.  Thank you, Marlys Stegmaier Allyne Gee RN    [place] Bing, MD, Lenise Arena.C.Gaylord Shih, MD, F.A.C.C Lewayne Bunting, MD, F.A.C.C Nona Dell, MD, F.A.C.C Charlton Haws, MD, Lenise Arena.C.C

## 2010-09-29 NOTE — Progress Notes (Signed)
Summary: test results  Phone Note Call from Patient Call back at Home Phone [phone]   Reason for Call: Talk to Nurse Summary of Call: S: pt calling for echo results that were done about a week ago. Initial call taken by: Faythe Ghee,  Dec 29, 2009 10:15 AM  Follow-up for Phone Call        gave pt her echo results , verbalized understanding Follow-up by: Teressa Lower RN,  Dec 29, 2009 3:08 PM

## 2010-09-29 NOTE — Miscellaneous (Signed)
Summary: labs cbcd,lipid,11/04/2009  Clinical Lists Changes  Observations: Added new observation of CALCIUM: 8.9 mg/dL (02/54/2706 23:76) Added new observation of ALBUMIN: 4.4 g/dL (28/31/5176 16:07) Added new observation of PROTEIN, TOT: 8.0 g/dL (37/05/6268 48:54) Added new observation of SGPT (ALT): 52 units/L (11/04/2009 13:04) Added new observation of SGOT (AST): 55 units/L (11/04/2009 13:04) Added new observation of ALK PHOS: 87 units/L (11/04/2009 13:04) Added new observation of GFR AA: >60 mL/min/1.25m2 (11/04/2009 13:04) Added new observation of GFR: >60 mL/min (11/04/2009 13:04) Added new observation of CREATININE: 0.83 mg/dL (62/70/3500 93:81) Added new observation of BUN: 17 mg/dL (82/99/3716 96:78) Added new observation of BG RANDOM: 100 mg/dL (93/81/0175 10:25) Added new observation of CO2 PLSM/SER: 30 meq/L (11/04/2009 13:04) Added new observation of CL SERUM: 100 meq/L (11/04/2009 13:04) Added new observation of K SERUM: 4.4 meq/L (11/04/2009 13:04) Added new observation of NA: 139 meq/L (11/04/2009 13:04) Added new observation of LDL: 82 mg/dL (85/27/7824 23:53) Added new observation of HDL: 51 mg/dL (61/44/3154 00:86) Added new observation of TRIGLYC TOT: 138 mg/dL (76/19/5093 26:71) Added new observation of CHOLESTEROL: 161 mg/dL (24/58/0998 33:82) Added new observation of PLATELETK/UL: 307 K/uL (11/04/2009 13:04) Added new observation of MCV: 89.5 fL (11/04/2009 13:04) Added new observation of HCT: 42.6 % (11/04/2009 13:04) Added new observation of HGB: 13.5 g/dL (50/53/9767 34:19) Added new observation of WBC COUNT: 6.4 10*3/microliter (11/04/2009 13:04) Added new observation of TSH: 1.057 microintl units/mL (11/04/2009 13:04) Added new observation of T4, FREE: 1.29 ng/dL (37/90/2409 73:53)

## 2010-09-29 NOTE — Assessment & Plan Note (Signed)
Summary: f77m   Visit Type:  Follow-up Referring Provider:  Dr. Mariel Sleet Primary Provider:  Sabino Snipes   History of Present Illness: [name] returns to the office as scheduled for continued assessment and treatment of hypertension, hyperlipidemia and evaluation of possible vascular disease.  Since her last visit, she has done quite well.  She continues on her weight reduction diet with good effect.  Blood pressure control has been excellent.  She has had no recurrent left arm discomfort, no chest discomfort and and no dyspnea.  Her principle complaints today are noncardiac.  She has had chronic diffuse pruritus and wonders whether this could be a drug allergy.  There have been no associated skin lesions.  She also has a sense of irritation around her eyes, perhaps with some swelling.  She has apparently been treated as if these were allergic reactions in the past with antihistamines.  Current Medications (verified): 1)  Levothroid 112 Mcg Tabs (Levothyroxine Sodium) .... Take 1 Tab Daily 2)  Lisinopril 20 Mg Tabs (Lisinopril) .... Take 1 Tab Daily 3)  Amlodipine Besylate 5 Mg Tabs (Amlodipine Besylate) .... Take 1 Tab Daily 4)  Evista 60 Mg Tabs (Raloxifene Hcl) .... Take 1 Tab Daily 5)  Fexofenadine Hcl 60 Mg Tabs (Fexofenadine Hcl) .... Take Prn 6)  Vitamin D 400 Unit Tabs (Cholecalciferol) .... Take 1 Tab Daily 7)  Citracal Plus  Tabs (Multiple Minerals-Vitamins) .... Take 1 Tab Daily 8)  Aspir-Low 81 Mg Tbec (Aspirin) .... Take 1 Tab Daily 9)  Systane Preservative Free 0.4-0.3 % Soln (Polyethyl Glycol-Propyl Glycol) .... Use 1 To 3 Times Daily 10)  Benadryl Maximum Strength 2 % Crea (Diphenhydramine Hcl) .... Use Prn 11)  Sm Eye Drops 0.05-0.1-1-1 % Soln (Tetrahydroz-Dextran-Peg-Povid) .Marland Kitchen.. 1 Drop Each Eye 2 To 3 Times Daily 12)  Azasite 1 % Soln (Azithromycin) .... Use 1 Drop Each Eye Daily  Allergies (verified): 1)  ! Sulfa 2)  ! Penicillin 3)  !  Erythromycin  Past History:  PMH, FH, and Social History reviewed and updated.  Past Medical History: Left arm discomfort ?  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  Vital Signs:  Patient profile:   65 year old female Weight:      166 pounds Pulse rate:   76 / minute BP sitting:   104 / 63  (right arm)  Vitals Entered By: Dreama Saa, CNA (March 30, 2010 12:53 PM)  Physical Exam  General:  Mildly overweight; well-developed; no acute distress: Neck-No JVD; no carotid bruits: Lungs-No tachypnea, clear without rales, rhonchi or wheezes: CV-normal PMI; normal S1 and S2; S4 present  Abdomen-BS normal; soft and non-tender without masses or organomegaly: MS-No deformities, cyanosis or clubbing: Neurologic-Nl cranial nerves; symmetric strength and tone: Skin- Warm, minor ecchymoses over both arms Extremities-Nl distal pulses;trace ankle edema    Impression & Recommendations:  Problem # 1:  ? of CEREBROVASCULAR DISEASE (ICD-437.9) Carotid ultrasound was essentially negative.  Minimal atherosclerotic changes of the carotids were described.  No further evaluation or treatment is warranted.  Problem # 2:  ELEVATION OF TRANSAMINASES (ICD-790.4) Resolved and likely related to treatment with a statin.  When statin therapy required, minor elevations of transaminases are acceptable as long as they remain  less than 3 times the upper limit of normal.  Problem # 3:  HYPERLIPIDEMIA (ICD-272.4) Repeat lipid profile off drug was quite good with total cholesterol of 157, triglycerides 135, HDL 42 and LDL of 88.  No pharmacological lipid-lowering therapy is warranted.  There  may have been some improvement related to weight loss and a change in diet.  [name] was congratulated on achieving both of these goals.  Problem # 4:  HYPERTENSION (ICD-401.9) Blood pressure control has been excellent.  This problem may also have improved as a result of patient's weight loss.  I suggested that she discontinue amlodipine to determine whether to drug therapy is necessary.  She preferred to discuss this with you before initiating a trial off of drug.  Her echocardiogram showed mild hypertrophy limited to the basilar septum and was otherwise normal.  [name] is doing extremely well.  I see no need for continued cardiology followup.  Please let me know if I can be of service to this nice woman at any time in the future.  Patient Instructions: 1)  Your physician recommends that you schedule a follow-up appointment in: as needed

## 2010-09-29 NOTE — Letter (Signed)
Summary: Stress Echocardiogram Information Sheet  [facility]  [address], [place] [address]   Phone: [phone]  Fax: [phone]      November 27, 2009 MRN: [id number] light prior to the test.   Maria Ritter  Doctor: Appointment Date: Appointment Time: Appointment Location: [facility]  Stress Echocardiogram Information Sheet    Instructions:   1. DO NOT  take your LISINOPRIL,AMLODIPINE,OR FEXOFENADINE  medicine MORNING OF YOUR TEST.  2. NOTHING TO EAT OR DRINK AFTER MIDNIGHT  3. Dress prepared to exercise.  4. DO NOT use ANY caffine or tobacco products 3 hours before appointment.  5. Report to the Short Stay Center on the1st floor.  6. Please bring all current prescription medications.  7. If you have any questions, please call [phone]

## 2010-09-29 NOTE — Letter (Signed)
Summary: [facility] [employer]  [address] [place] [address]   Phone: [phone]  Fax: [phone]     November 27, 2009 MRN: [id number]   Maria Ritter [address] [place], [place]  [address]      YOUR LAB WORK IS DUE   ___________MAY 12, 2011______________________________  Please go to [facility], located across the street from [facility] on the second floor.  Hours are Monday - Friday 7am until 7:30pm         Saturday 8am until 12noon    __  DO NOT EAT OR DRINK AFTER MIDNIGHT EVENING PRIOR TO LABWORK  _X_ YOUR LABWORK IS NOT FASTING --YOU MAY EAT PRIOR TO LABWORK

## 2010-12-15 LAB — CBC
Hemoglobin: 13.3 g/dL (ref 12.0–15.0)
MCV: 88.4 fL (ref 78.0–100.0)
Platelets: 283 10*3/uL (ref 150–400)
WBC: 5.5 10*3/uL (ref 4.0–10.5)

## 2010-12-15 LAB — COMPREHENSIVE METABOLIC PANEL
ALT: 25 U/L (ref 0–35)
AST: 24 U/L (ref 0–37)
Albumin: 4.1 g/dL (ref 3.5–5.2)
Alkaline Phosphatase: 99 U/L (ref 39–117)
CO2: 33 mEq/L — ABNORMAL HIGH (ref 19–32)
Chloride: 100 mEq/L (ref 96–112)
GFR calc Af Amer: >60 mL/min (ref >60)
GFR calc non Af Amer: >60 mL/min (ref >60)
Potassium: 4.2 mEq/L (ref 3.5–5.1)
Sodium: 140 mEq/L (ref 135–145)
Total Bilirubin: 0.4 mg/dL (ref 0.3–1.2)

## 2010-12-15 LAB — CREATININE, SERUM: GFR calc non Af Amer: >60 mL/min (ref >60)

## 2010-12-17 ENCOUNTER — Encounter (HOSPITAL_COMMUNITY): Payer: Medicare HMO | Attending: Oncology

## 2010-12-17 DIAGNOSIS — C50919 Malignant neoplasm of unspecified site of unspecified female breast: Secondary | ICD-10-CM

## 2010-12-18 ENCOUNTER — Ambulatory Visit (HOSPITAL_COMMUNITY): Payer: Self-pay | Admitting: Oncology

## 2011-01-12 NOTE — H&P (Signed)
Maria Ritter, KLUTTS            ACCOUNT NO.:  [id number]   MEDICAL RECORD NO.:  [id number]         PATIENT TYPE:  PAMB   LOCATION:  DAY                           FACILITY:  [facility]   PHYSICIAN:  Dalia Heading, M.D.  DATE OF BIRTH:  1946/08/27   DATE OF ADMISSION:  DATE OF DISCHARGE:  LH                              HISTORY & PHYSICAL   CHIEF COMPLAINT:  Right breast carcinoma, stage 0.   HISTORY OF PRESENT ILLNESS:  The patient is a 64 year old white female  who is referred for evaluation and treatment of the right breast  carcinoma.  This was found on routine mammography and biopsy-proven to  be ductal carcinoma in situ.  MRI of the breasts were negative for  metastatic disease.  There is no family history of breast carcinoma or  nipple discharge.   PAST MEDICAL HISTORY:  Includes hypothyroidism and hypertension.   PAST SURGICAL HISTORY:  Left ring finger surgery and uterine ablation.   CURRENT MEDICATIONS:  1. Baby aspirin, which she is holding.  2. Levothyroxine 137 mcg p.o. daily.  3. Sular 8.5 mg p.o. every week.  4. Lisinopril 20 mg p.o. daily.  5. Pravastatin 40 mg p.o. daily.  6. Vitamin D supplements.   ALLERGIES:  SULFA, PENICILLIN, and ERYTHROMYCIN.   REVIEW OF SYSTEMS:  Noncontributory.   PHYSICAL EXAMINATION:  GENERAL:  The patient is a well developed and  well nourished white female, in no acute distress.  NECK:  Supple without lymphadenopathy  LUNGS:  Clear to auscultation with equal breath sounds bilaterally.  HEART:  Examination reveals regular rate and rhythm without S3, S4, or  murmurs.  BREASTS:  Right breast examination reveals no dominant mass, nipple  discharge, or dimpling.  The axilla is negative for palpable nodes.  Left breast examination reveals no dominant mass, nipple discharge, or  dimpling.  The axilla is negative for palpable nodes.   Mammography and MRI of the breast revealed a 2.7 cm of malignancy in the  upper, outer quadrant.  She  is ER/PR positive.   IMPRESSION:  Right breast carcinoma, stage 0.   PLAN:  The patient has elected to proceed with a right partial  mastectomy after needle localization, sentinel lymph node biopsy,  possible axillary dissection on October 26, 2008.  Risks and benefits  of the procedure including bleeding, infection, nerve injury, the need  for possible repeat surgery, and possibly recurrence of the breast  cancer were fully explained to the patient, gave informed consent.      Dalia Heading, M.D.  Electronically Signed     MAJ/MEDQ  D:  10/10/2008  T:  10/10/2008  Job:  [id number]   cc:   Catalina Pizza, M.D.  Fax: [phone]

## 2011-01-12 NOTE — Op Note (Signed)
NAME:  Maria Ritter, Maria Ritter            ACCOUNT NO.:  [id number]   MEDICAL RECORD NO.:  [id number]          PATIENT TYPE:  AMB   LOCATION:  DAY                           FACILITY:  [facility]   PHYSICIAN:  R. Roetta Sessions, M.D. DATE OF BIRTH:  01-09-1946   DATE OF PROCEDURE:  DATE OF DISCHARGE:                               OPERATIVE REPORT   PROCEDURE:  Ileocolonoscopy.   INDICATIONS FOR PROCEDURE:  A 65 year old lady with lower GI tract  symptoms sent over at the courtesy of Dr. Delbert Harness for colorectal  cancer screening.  It has been a good 12 years since she had her last  colonoscopy [place], [place]).  Positive family history of colon cancer  in a second degree relative (paternal grandmother).  Colonoscopy is now  being done.  This approach has been discussed with the patient at  length.  The potential risks, benefits, and alternatives have been  reviewed and questions answered.  Please see documentation in medical  record.   PROCEDURE NOTE:  Oxygen saturation, blood pressure, pulse, respirations  were monitored throughout the entire procedure.   CONSCIOUS SEDATION:  Versed 4 mg IV, Demerol 75 mg IV in divided doses.   INSTRUMENT:  Pentax video chip system.   FINDINGS:  Digital rectal examination revealed no abnormalities.   ENDOSCOPIC FINDINGS:  Prep was adequate.   COLON:  The colonic mucosa was surveyed in the rectosigmoid junction  through the left transverse and right colon to the appendiceal orifice,  ileocecal valve and cecum.  The structures were well seen and  photographed for the record.  Terminal ileum was intubated 5 cm.  From  this level, the scope was slowly withdrawn.  All previously mentioned  mucosal surfaces were gain seen.  The patient had narrow mouth left  sided diverticulum.  The remainder of the colonic mucosa appeared  normal.  __________ Normal terminal ileum.  The scope was pulled down to  the rectum where a thorough examination of the rectal mucosa  including  retroflexed view of the anal verge demonstrated no abnormalities.  The  patient tolerated the procedure well and reacted in endoscopy.   IMPRESSION:  1. Normal rectum.  2. Left sided diverticula and colonic mucosa.  Terminal ileum mucosa      appeared normal.   RECOMMENDATIONS:  1. Diverticulosis literature was provided to [name].  2. Recommend repeat colonoscopy 5 years.      Jonathon Bellows, M.D.  Electronically Signed     RMR/MEDQ  D:  08/15/2007  T:  08/15/2007  Job:  [id number]   cc:   Delbert Harness, MD

## 2011-01-12 NOTE — Op Note (Signed)
NAME:  Maria Ritter, Maria Ritter NO.:  [id number]   MEDICAL RECORD NO.:  [id number]          PATIENT TYPE:  AMB   LOCATION:  DAY                           FACILITY:  [facility]   PHYSICIAN:  Dalia Heading, M.D.  DATE OF BIRTH:  [birth year]   DATE OF PROCEDURE:  10/24/2008  DATE OF DISCHARGE:                               OPERATIVE REPORT   PREOPERATIVE DIAGNOSIS:  Right breast carcinoma, stage zero.   POSTOPERATIVE DIAGNOSIS:  Right breast carcinoma, stage zero.   PROCEDURE:  Right partial mastectomy after needle localization, right  axillary sentinel lymph node biopsy.   SURGEON:  Dalia Heading, MD   ANESTHESIA:  General endotracheal.   INDICATIONS:  The patient is a 65 year old white female who has biopsy-  proven ductal carcinoma in situ of the right breast.  MRI of the breasts  were negative for metastatic disease.  The patient now comes to the  operating room for a right partial mastectomy after needle localization,  sentinel lymph node biopsy, possible completion axillary dissection.  The risks and benefits of the procedures including bleeding, infection,  pain, and the possibility of needing further surgery were fully  explained to the patient, gave informed consent.   PROCEDURE NOTE:  The patient was placed in the supine position.  She had  already undergone needle localization in the Radiology Department as  well as sentinel lymph node injection.  After induction of general  endotracheal anesthesia, the right breast biopsy site was injected with  methylene blue.  This was massaged into the right breast for 5 minutes.  The right breast and axilla were then prepped and draped using the usual  sterile technique with DuraPrep.  Surgical site confirmation was  performed.   Initially, the navigator probe was used to localize the sentinel nodes  of the right axilla.  A small incision was made into the right axilla  and several lymph nodes were found.  Skin count was  2000, in-vivo count  3000, ex-vivo count 1100.  The lymph node was blue.  The second lymph  node had an in-vivo count of 1300 and an ex-vivo count of 2000.  This  was blue.  The basin counts were less than 60.  The lymph nodes were  sent to Pathology for frozen section.  Frozen section was negative for  metastatic disease.  This specimen was then sent for permanent sections.  The skin was injected with 0.5% Sensorcaine.  Any bleeding was  controlled using small clips.  The subcutaneous layer was reapproximated  using a 3-0 Vicryl interrupted suture.  The skin was closed using a 4-0  Vicryl subcuticular suture.   Next, a right partial mastectomy was performed.  A curvilinear incision  was made at the needle localization site, which was noted at the 9  o'clock position in the right breast.  This was taken down to the area  of concern.  A gross margin of normal tissue was found and this was  freed away.  A short suture was placed superiorly and a long suture  placed laterally for intention purposes.  The specimen was sent to x-  ray.  Specimen radiography revealed the microcalcifications to be within  the specimen removed.  The specimen was then sent to Pathology for  further examination.  Any bleeding was controlled using Bovie  electrocautery.  The skin was injected with 0.5% Sensorcaine.  The  subcutaneous layer was reapproximated using a 3-0 Vicryl interrupted  suture.  The skin was closed using a 4-0 Vicryl subcuticular suture.  Dermabond was applied to both wounds.   All tape and needle counts were correct at the end of the procedure.  The patient was extubated in the operating room and went back to  recovery room awake in stable condition.   COMPLICATIONS:  None.   SPECIMEN:  1. One sentinel lymph nodes, right axilla.  2. Right breast tissue, short suture superior and long suture lateral.   ESTIMATED BLOOD LOSS:  Minimal.      Dalia Heading, M.D.  Electronically  Signed     MAJ/MEDQ  D:  10/24/2008  T:  10/25/2008  Job:  [id number]   cc:   Catalina Pizza, M.D.  Fax: [phone]

## 2011-04-22 ENCOUNTER — Encounter: Payer: Self-pay | Admitting: Cardiology

## 2011-12-17 ENCOUNTER — Ambulatory Visit (HOSPITAL_COMMUNITY): Payer: Medicare HMO | Admitting: Oncology

## 2012-07-31 ENCOUNTER — Encounter: Payer: Self-pay | Admitting: Internal Medicine

## 2012-09-18 ENCOUNTER — Telehealth: Payer: Self-pay | Admitting: Internal Medicine

## 2014-09-05 ENCOUNTER — Ambulatory Visit (HOSPITAL_COMMUNITY)
Admission: RE | Admit: 2014-09-05 | Discharge: 2014-09-05 | Disposition: A | Discharge status: Home / Self Care | Payer: Medicare Other | Source: Ambulatory Visit | Attending: Internal Medicine | Admitting: Internal Medicine

## 2014-09-05 ENCOUNTER — Other Ambulatory Visit (HOSPITAL_COMMUNITY): Payer: Self-pay | Admitting: Internal Medicine

## 2014-09-05 DIAGNOSIS — W010XXS Fall on same level from slipping, tripping and stumbling without subsequent striking against object, sequela: Secondary | ICD-10-CM | POA: Diagnosis not present

## 2014-09-05 DIAGNOSIS — M25562 Pain in left knee: Secondary | ICD-10-CM

## 2014-09-05 DIAGNOSIS — M25561 Pain in right knee: Secondary | ICD-10-CM

## 2014-09-05 DIAGNOSIS — G8929 Other chronic pain: Secondary | ICD-10-CM | POA: Diagnosis present

## 2014-09-05 DIAGNOSIS — M7989 Other specified soft tissue disorders: Secondary | ICD-10-CM | POA: Diagnosis not present

## 2014-09-05 DIAGNOSIS — M170 Bilateral primary osteoarthritis of knee: Secondary | ICD-10-CM | POA: Insufficient documentation

## 2015-01-28 ENCOUNTER — Other Ambulatory Visit (HOSPITAL_COMMUNITY): Payer: Self-pay | Admitting: Oncology

## 2015-01-28 DIAGNOSIS — Z9889 Other specified postprocedural states: Secondary | ICD-10-CM

## 2015-03-18 ENCOUNTER — Ambulatory Visit (HOSPITAL_COMMUNITY)
Admission: RE | Admit: 2015-03-18 | Discharge: 2015-03-18 | Disposition: A | Discharge status: Home / Self Care | Payer: Medicare Other | Source: Ambulatory Visit | Attending: Oncology | Admitting: Oncology

## 2015-03-18 ENCOUNTER — Other Ambulatory Visit (HOSPITAL_COMMUNITY): Payer: Self-pay | Admitting: Oncology

## 2015-03-18 DIAGNOSIS — Z9889 Other specified postprocedural states: Secondary | ICD-10-CM

## 2015-03-18 DIAGNOSIS — Z853 Personal history of malignant neoplasm of breast: Secondary | ICD-10-CM | POA: Insufficient documentation

## 2015-03-18 DIAGNOSIS — N63 Unspecified lump in breast: Secondary | ICD-10-CM | POA: Insufficient documentation

## 2015-08-13 ENCOUNTER — Other Ambulatory Visit (HOSPITAL_COMMUNITY): Payer: Self-pay | Admitting: Internal Medicine

## 2015-08-13 DIAGNOSIS — I739 Peripheral vascular disease, unspecified: Principal | ICD-10-CM

## 2015-08-13 DIAGNOSIS — I779 Disorder of arteries and arterioles, unspecified: Secondary | ICD-10-CM

## 2015-08-18 ENCOUNTER — Ambulatory Visit (HOSPITAL_COMMUNITY)
Admission: RE | Admit: 2015-08-18 | Discharge: 2015-08-18 | Disposition: A | Discharge status: Home / Self Care | Payer: Medicare Other | Source: Ambulatory Visit | Attending: Internal Medicine | Admitting: Internal Medicine

## 2015-08-18 DIAGNOSIS — E785 Hyperlipidemia, unspecified: Secondary | ICD-10-CM | POA: Insufficient documentation

## 2015-08-18 DIAGNOSIS — I6523 Occlusion and stenosis of bilateral carotid arteries: Secondary | ICD-10-CM | POA: Insufficient documentation

## 2015-08-18 DIAGNOSIS — Z8673 Personal history of transient ischemic attack (TIA), and cerebral infarction without residual deficits: Secondary | ICD-10-CM | POA: Diagnosis not present

## 2015-08-18 DIAGNOSIS — I779 Disorder of arteries and arterioles, unspecified: Secondary | ICD-10-CM

## 2015-08-18 DIAGNOSIS — I10 Essential (primary) hypertension: Secondary | ICD-10-CM | POA: Insufficient documentation

## 2015-08-18 DIAGNOSIS — I739 Peripheral vascular disease, unspecified: Secondary | ICD-10-CM

## 2015-09-22 ENCOUNTER — Ambulatory Visit: Payer: Medicare Other | Admitting: Cardiology

## 2015-10-14 ENCOUNTER — Ambulatory Visit (INDEPENDENT_AMBULATORY_CARE_PROVIDER_SITE_OTHER): Payer: Medicare Other | Admitting: Cardiology

## 2015-10-14 ENCOUNTER — Encounter: Payer: Self-pay | Admitting: Cardiology

## 2015-10-14 VITALS — BP 120/78 | HR 75 | Ht 65.0 in | Wt 180.0 lb

## 2015-10-14 DIAGNOSIS — R072 Precordial pain: Secondary | ICD-10-CM

## 2015-10-14 DIAGNOSIS — I10 Essential (primary) hypertension: Secondary | ICD-10-CM | POA: Diagnosis not present

## 2015-10-14 DIAGNOSIS — I779 Disorder of arteries and arterioles, unspecified: Secondary | ICD-10-CM

## 2015-10-14 DIAGNOSIS — R0609 Other forms of dyspnea: Secondary | ICD-10-CM | POA: Diagnosis not present

## 2015-10-14 DIAGNOSIS — I739 Peripheral vascular disease, unspecified: Secondary | ICD-10-CM

## 2015-10-14 DIAGNOSIS — R0600 Dyspnea, unspecified: Secondary | ICD-10-CM

## 2015-10-14 DIAGNOSIS — E782 Mixed hyperlipidemia: Secondary | ICD-10-CM

## 2015-10-14 NOTE — Patient Instructions (Signed)
Medication Instructions:  Your physician recommends that you continue on your current medications as directed. Please refer to the Current Medication list given to you today.'  Labwork: none  Testing/Procedures: Your physician has requested that you have en exercise stress myoview. For further information please visit [email or web address]. Please follow instruction sheet, as given.    Follow-Up: We will call  You with the test results   Any Other Special Instructions Will Be Listed Below (If Applicable).     If you need a refill on your cardiac medications before your next appointment, please call your pharmacy.

## 2015-10-14 NOTE — Progress Notes (Signed)
Cardiology Office Note  Date: 10/14/2015   ID: MAGDELENA CAMBRIDGE, DOB 11-Feb-1946, MRN [id number]  PCP: Wende Neighbors, MD  Consulting Cardiologist: Rozann Lesches, MD   Chief Complaint  Patient presents with  . Chest Pain    History of Present Illness: Maria Ritter is a 70 y.o. female referred for cardiology consultation by Dr. Nevada Crane. She is a former patient of Dr. Lattie Haw, not seen since 2011. I reviewed her records and updated chart. She describes moderate dyspnea on exertion particularly with walking on an uphill grade. When she goes out to her mailbox which is about 50 feet from her front door, the return trip is uphill and she notices the symptoms at that time. Also on moving her trashcan out to the street. She also experiences a burning discomfort in her epigastric to lower precordial area. In the past she reports having indigestion symptoms that were similar, but she has not had any recent ischemic evaluations.  She brought in records for me to review today. She had a Agricultural consultant study done at [facility] in June 2014. This report did not indicate any diagnostic ST segment abnormalities and her perfusion imaging was normal with LVEF greater than 70%.  She also recently had follow-up carotid Dopplers which are reviewed below. I reviewed her lab work as well.  Medications are outlined below and include aspirin, Norvasc, HCTZ, and lisinopril. She is undergoing a very slow taper off of prednisone which is been used for management of polymyalgia rheumatica. She reports chronic problems with leg and knee discomfort most recently.  ECG today shows normal sinus rhythm.  Past Medical History  Diagnosis Date  . Polymyalgia rheumatica ([place])   . Carotid artery disease (HCC)     Mild to moderate bilateral ICA disease, left greater than right 07/2015  . Hypothyroidism   . Hyperlipidemia   . Carcinoma in situ of breast     Excision of tumor followed by radiation  therapy in 2010  . Essential hypertension   . GERD (gastroesophageal reflux disease)   . Osteoarthritis   . Glaucoma     Past Surgical History  Procedure Laterality Date  . Finger surgery      Minor surgical intervention following a crush injury to the left fourth finger  . Endometrial ablation  2002  . Basal cell carcinoma excision  2010    Right breast    Current Outpatient Prescriptions  Medication Sig Dispense Refill  . acetaminophen (TYLENOL) 500 MG tablet Take by mouth.    Marland Kitchen amLODipine (NORVASC) 5 MG tablet Take 5 mg by mouth daily.      Marland Kitchen aspirin 81 MG tablet Take 81 mg by mouth daily.      Marland Kitchen azithromycin (AZASITE) 1 % ophthalmic solution Place 1 drop into both eyes daily.      . calcium citrate-vitamin D (CITRACAL+D) 315-200 MG-UNIT tablet Take by mouth.    . cholecalciferol (VITAMIN D) 400 UNITS TABS Take 400 Units by mouth daily.      . diphenhydrAMINE (BENADRYL) 2 % cream Apply topically as needed.      . fexofenadine (ALLEGRA) 60 MG tablet Take 60 mg by mouth as needed.      . Flaxseed, Linseed, (FLAXSEED OIL) 1000 MG CAPS Take by mouth.    . hydrochlorothiazide (HYDRODIURIL) 25 MG tablet Take 25 mg by mouth daily as needed.    Marland Kitchen levothyroxine (SYNTHROID, LEVOTHROID) 112 MCG tablet Take 112 mcg by mouth daily.      Marland Kitchen  lisinopril (PRINIVIL,ZESTRIL) 20 MG tablet Take 20 mg by mouth daily.      . Misc Natural Products (GERMANIUM) 10 MG CAPS Take by mouth.    . Multiple Minerals-Vitamins (CITRACAL PLUS) TABS Take 1 tablet by mouth daily.      . pantoprazole (PROTONIX) 40 MG tablet Take 40 mg by mouth daily.    Vladimir Faster Glycol-Propyl Glycol (SYSTANE PRESERVATIVE FREE) 0.4-0.3 % SOLN Apply to eye 3 (three) times daily.      . predniSONE (DELTASONE) 5 MG tablet   0  . raloxifene (EVISTA) 60 MG tablet Take 60 mg by mouth daily.      . Tetrahydroz-Dextran-PEG-Povid (SM EYE DROPS) 0.05-0.1-1-1 % SOLN Apply 1 drop to eye 3 (three) times daily.      Marland Kitchen triamcinolone (KENALOG)  0.025 % cream   0   No current facility-administered medications for this visit.   Allergies:  Erythromycin; Penicillins; and Sulfonamide derivatives   Social History: The patient  reports that she has never smoked. She does not have any smokeless tobacco history on file. She reports that she does not drink alcohol.   Family History: The patient's family history includes Heart failure in her father; Parkinsonism in her mother; Pneumonia in her mother.   ROS:  Please see the history of present illness. Otherwise, complete review of systems is positive for arthritic symptoms, less recent trouble with PMR.  All other systems are reviewed and negative.   Physical Exam: VS:  BP 120/78 mmHg  Pulse 75  Ht 5\' 5"  (1.651 m)  Wt 180 lb (81.647 kg)  BMI 29.95 kg/m2  SpO2 98%, BMI Body mass index is 29.95 kg/(m^2).  Wt Readings from Last 3 Encounters:  10/14/15 180 lb (81.647 kg)  03/30/10 166 lb (75.297 kg)  01/28/10 172 lb (78.019 kg)    General: Overweight woman, appears comfortable at rest. HEENT: Conjunctiva and lids normal, oropharynx clear. Neck: Supple, no elevated JVP or carotid bruits, no thyromegaly. Lungs: Clear to auscultation, nonlabored breathing at rest. Cardiac: Regular rate and rhythm, no S3 or significant systolic murmur, no pericardial rub. Abdomen: Soft, nontender, bowel sounds present, no guarding or rebound. Extremities: No pitting edema, distal pulses 2+. Skin: Warm and dry. Musculoskeletal: No kyphosis. Neuropsychiatric: Alert and oriented x3, affect grossly appropriate.  ECG: I personally reviewed the prior tracing from 11/27/2009 which showed sinus rhythm with increased voltage and possible left atrial enlargement.  Recent Labwork:    Component Value Date/Time   CHOL 157 01/30/2010 2016   TRIG 135 01/30/2010 2016   HDL 42 01/30/2010 2016   CHOLHDL 3.7 Ratio 01/30/2010 2016   VLDL 27 01/30/2010 2016   LDLCALC 88 01/30/2010 2016  December 2016: Potassium 4.7,  BUN 13, creatinine 0.7, AST 23, ALT 26, hemoglobin 13.2, platelets 342, cholesterol 174, triglycerides 165, HDL 44, LDL 97, TSH 1.8  Other Studies Reviewed Today:  Carotid Dopplers 08/18/2015: IMPRESSION: Minimal to moderate amount of bilateral atherosclerotic plaque, left greater than right, not resulting in a hemodynamically significant stenosis within either internal carotid artery.  Echocardiogram 02/03/2010: Study Conclusions  - Left ventricle: The cavity size was normal. There was mild focal  basal hypertrophy of the septum. Systolic function was normal. The  estimated ejection fraction was in the range of 55% to 60%. Wall  motion was normal; there were no regional wall motion  abnormalities. - Mitral valve: Calcified annulus. - Pulmonary arteries: PA peak pressure: 104mm Hg (S).  Assessment and Plan:  1. Dyspnea on exertion and  intermittent precordial/epigastric discomfort. Resting ECG is normal today. Cardiac risk factors include documented atherosclerosis with known carotid artery disease, hyperlipidemia, and hypertension. She states that her symptoms are moderate and more noticeable over the last year. We will plan to arrange an exercise Cardiolite to reevaluate for ischemia, her last assessment was in 2014 in [place], [place].  2. Carotid artery disease, mild to moderate based on most recent Dopplers, left greater than right. Neither described as being hemodynamically significant however, and plan will be to continue medical therapy and observation. She should have a follow-up carotid ultrasound in one year. She continues on aspirin.  3. Essential hypertension, blood pressure is well controlled today. I made no medication changes.  4. Hyperlipidemia, LDL 97. She is on flaxseed extract. She follows with Dr. Nevada Crane.  Current medicines were reviewed with the patient today.   Orders Placed This Encounter  Procedures  . NM Myocar Multi W/Spect W/Wall Motion / EF  . EKG  12-Lead    Disposition: Call with results.   Signed, Satira Sark, MD, [facility] 10/14/2015 11:04 AM    [facility] [place]. [address], [place], [place] [address] Phone: [phone]; Fax: [phone]

## 2015-10-23 ENCOUNTER — Encounter (HOSPITAL_COMMUNITY): Payer: Self-pay

## 2015-10-23 ENCOUNTER — Encounter (HOSPITAL_COMMUNITY)
Admission: RE | Admit: 2015-10-23 | Discharge: 2015-10-23 | Disposition: A | Discharge status: Home / Self Care | Payer: Medicare Other | Source: Ambulatory Visit | Attending: Cardiology | Admitting: Cardiology

## 2015-10-23 ENCOUNTER — Inpatient Hospital Stay (HOSPITAL_COMMUNITY): Admission: RE | Admit: 2015-10-23 | Payer: Medicare Other | Source: Ambulatory Visit

## 2015-10-23 ENCOUNTER — Telehealth: Payer: Self-pay

## 2015-10-23 DIAGNOSIS — R0609 Other forms of dyspnea: Secondary | ICD-10-CM | POA: Insufficient documentation

## 2015-10-23 DIAGNOSIS — R0600 Dyspnea, unspecified: Secondary | ICD-10-CM

## 2015-10-23 LAB — NM MYOCAR MULTI W/SPECT W/WALL MOTION / EF
CHL CUP MPHR: 150 {beats}/min
CHL CUP RESTING HR STRESS: 64 {beats}/min
CHL RATE OF PERCEIVED EXERTION: 16
CSEPEDS: 1 s
CSEPEW: 7 METS
Exercise duration (min): 5 min
LV sys vol: 21 mL
LVDIAVOL: 56 mL
NUC STRESS TID: 1.03
Peak HR: 136 {beats}/min
Percent HR: 90 %
RATE: 0.21
SDS: 6
SRS: 4
SSS: 10

## 2015-10-23 MED ORDER — TECHNETIUM TC 99M SESTAMIBI - CARDIOLITE
30.0000 | Freq: Once | INTRAVENOUS | Status: AC | PRN
  Administered 2015-10-23: 10:00:00 30 via INTRAVENOUS

## 2015-10-23 MED ORDER — REGADENOSON 0.4 MG/5ML IV SOLN
INTRAVENOUS | Status: AC
  Filled 2015-10-23: qty 5

## 2015-10-23 MED ORDER — TECHNETIUM TC 99M SESTAMIBI GENERIC - CARDIOLITE
10.0000 | Freq: Once | INTRAVENOUS | Status: AC | PRN
  Administered 2015-10-23: 10 via INTRAVENOUS

## 2015-10-23 MED ORDER — SODIUM CHLORIDE 0.9% FLUSH
INTRAVENOUS | Status: AC
  Administered 2015-10-23: 10 mL via INTRAVENOUS
  Filled 2015-10-23: qty 10

## 2015-10-23 MED ORDER — SODIUM CHLORIDE 0.9% FLUSH
INTRAVENOUS | Status: AC
  Filled 2015-10-23: qty 200

## 2015-10-23 NOTE — Telephone Encounter (Signed)
NA,NM, apt made for Tuesday 11/05/15 at 1 pm at [facility] office

## 2015-10-23 NOTE — Telephone Encounter (Signed)
-----   Message from Maria Sark, MD sent at 10/23/2015  3:35 PM EST ----- I reviewed the report and images.  ECG did show some abnormalities and there is a possible region of ischemia in the lateral wall. In light of her symptoms described at the recent office visit, we should probably talk about whether a cardiac catheterization may be best to more clearly evaluate her coronary arteries. Please make sure she has a follow-up visit.

## 2015-10-28 ENCOUNTER — Encounter: Payer: Self-pay | Admitting: Cardiology

## 2015-10-28 ENCOUNTER — Ambulatory Visit (INDEPENDENT_AMBULATORY_CARE_PROVIDER_SITE_OTHER): Payer: Medicare Other | Admitting: Cardiology

## 2015-10-28 VITALS — BP 136/78 | HR 91 | Ht 65.0 in | Wt 180.0 lb

## 2015-10-28 DIAGNOSIS — R9430 Abnormal result of cardiovascular function study, unspecified: Secondary | ICD-10-CM | POA: Diagnosis not present

## 2015-10-28 DIAGNOSIS — R0609 Other forms of dyspnea: Secondary | ICD-10-CM | POA: Diagnosis not present

## 2015-10-28 DIAGNOSIS — I779 Disorder of arteries and arterioles, unspecified: Secondary | ICD-10-CM

## 2015-10-28 DIAGNOSIS — I739 Peripheral vascular disease, unspecified: Secondary | ICD-10-CM

## 2015-10-28 DIAGNOSIS — R0600 Dyspnea, unspecified: Secondary | ICD-10-CM

## 2015-10-28 NOTE — Progress Notes (Signed)
Cardiology Office Note  Date: 10/28/2015   ID: Maria Ritter, Maria Ritter 08-26-1946, MRN [id number]  PCP: Wende Neighbors, MD  Primary Cardiologist: Rozann Lesches, MD   Chief Complaint  Patient presents with  . Follow-up stress test    History of Present Illness: Maria Ritter is a 70 y.o. female seen recently in consultation for evaluation of exertional dyspnea and chest discomfort suspicious for angina. She has a history of hypertension and hyperlipidemia as well as vascular disease in the form of carotid artery atherosclerosis. She was referred for ischemic evaluation with stress testing.  Report reviewed below. In summary abnormal ECG changes were noted suggestive of ischemia, mainly in lead II, and there was a region of potential scar in the inferolateral distribution, LVEF 63%. Today and went over the results of the test in detail, explained the implications. Overall the study was low risk, however did not exclude underlying ischemic heart disease. Main question was whether we needed to pursue a heart catheterization.  Maria Ritter had several questions today which we addressed in full. She tells me that she has a prior history of IV contrast allergy discovered after she had a CT scan. She also tells me that she has reservations about pursuing a heart catheterization at this time. She states that she does not have any family members to stay with her, concerned about leaving her dogs at home, also worried about the procedure in general. He stated that she would prefer to pursue a strategy of diet and exercise and see how she does overall.  We discussed her medications. We went over indications for daily aspirin for cardiac risk reduction. Otherwise she is on Norvasc, HCTZ, lisinopril, and flaxseed oil. She is in the process of weaning off of long-term prednisone as well. She is not on a statin medication, her LDL most recently was 97 however.  Past Medical History  Diagnosis Date  . Polymyalgia  rheumatica ([place])   . Carotid artery disease (HCC)     Mild to moderate bilateral ICA disease, left greater than right 07/2015  . Hypothyroidism   . Hyperlipidemia   . Carcinoma in situ of breast     Excision of tumor followed by radiation therapy in 2010  . Essential hypertension   . GERD (gastroesophageal reflux disease)   . Osteoarthritis   . Glaucoma     Past Surgical History  Procedure Laterality Date  . Finger surgery      Minor surgical intervention following a crush injury to the left fourth finger  . Endometrial ablation  2002  . Basal cell carcinoma excision  2010    Right breast    Current Outpatient Prescriptions  Medication Sig Dispense Refill  . acetaminophen (TYLENOL) 500 MG tablet Take by mouth.    Marland Kitchen amLODipine (NORVASC) 5 MG tablet Take 5 mg by mouth daily.      Marland Kitchen aspirin 81 MG tablet Take 81 mg by mouth daily.      Marland Kitchen azithromycin (AZASITE) 1 % ophthalmic solution Place 1 drop into both eyes daily.      . calcium citrate-vitamin D (CITRACAL+D) 315-200 MG-UNIT tablet Take by mouth.    . cholecalciferol (VITAMIN D) 400 UNITS TABS Take 400 Units by mouth daily.      . diphenhydrAMINE (BENADRYL) 2 % cream Apply topically as needed.      . fexofenadine (ALLEGRA) 60 MG tablet Take 60 mg by mouth as needed.      . Flaxseed, Linseed, (FLAXSEED OIL)  1000 MG CAPS Take by mouth.    . hydrochlorothiazide (HYDRODIURIL) 25 MG tablet Take 25 mg by mouth daily as needed.    Marland Kitchen levothyroxine (SYNTHROID, LEVOTHROID) 112 MCG tablet Take 112 mcg by mouth daily.      Marland Kitchen lisinopril (PRINIVIL,ZESTRIL) 20 MG tablet Take 20 mg by mouth daily.      . Misc Natural Products (GERMANIUM) 10 MG CAPS Take by mouth.    . Multiple Minerals-Vitamins (CITRACAL PLUS) TABS Take 1 tablet by mouth daily.      . pantoprazole (PROTONIX) 40 MG tablet Take 40 mg by mouth daily.    Vladimir Faster Glycol-Propyl Glycol (SYSTANE PRESERVATIVE FREE) 0.4-0.3 % SOLN Apply to eye 3 (three) times daily.      .  predniSONE (DELTASONE) 5 MG tablet   0  . raloxifene (EVISTA) 60 MG tablet Take 60 mg by mouth daily.      . Tetrahydroz-Dextran-PEG-Povid (SM EYE DROPS) 0.05-0.1-1-1 % SOLN Apply 1 drop to eye 3 (three) times daily.      Marland Kitchen triamcinolone (KENALOG) 0.025 % cream   0   No current facility-administered medications for this visit.   Allergies:  Erythromycin; Penicillins; and Sulfonamide derivatives   Social History: The patient  reports that she has never smoked. She does not have any smokeless tobacco history on file. She reports that she does not drink alcohol.   ROS:  Please see the history of present illness. Otherwise, complete review of systems is positive for knee pain.  All other systems are reviewed and negative.   Physical Exam: VS:  BP 136/78 mmHg  Pulse 91  Ht 5\' 5"  (1.651 m)  Wt 180 lb (81.647 kg)  BMI 29.95 kg/m2  SpO2 96%, BMI Body mass index is 29.95 kg/(m^2).  Wt Readings from Last 3 Encounters:  10/28/15 180 lb (81.647 kg)  10/14/15 180 lb (81.647 kg)  03/30/10 166 lb (75.297 kg)    General: Overweight woman, appears comfortable at rest. HEENT: Conjunctiva and lids normal, oropharynx clear. Neck: Supple, no elevated JVP or carotid bruits, no thyromegaly. Lungs: Clear to auscultation, nonlabored breathing at rest. Cardiac: Regular rate and rhythm, no S3 or significant systolic murmur, no pericardial rub. Abdomen: Soft, nontender, bowel sounds present, no guarding or rebound. Extremities: No pitting edema, distal pulses 2+. Skin: Warm and dry. Musculoskeletal: No kyphosis. Neuropsychiatric: Alert and oriented x3, affect grossly appropriate.  ECG: I personally reviewed the recent tracing from 10/14/2015 which showed normal sinus rhythm.  Recent Labwork:  December 2016: Potassium 4.7, BUN 13, creatinine 0.7, AST 23, ALT 26, hemoglobin 13.2, platelets 342, cholesterol 174, triglycerides 165, HDL 44, LDL 97, TSH 1.8  Other Studies Reviewed Today:  Exercise  Cardiolite 10/23/2015:  Horizontal ST segment depression ST segment depression was noted during stress in the II leads.  Defect 1: There is a medium defect of mild severity present in the mid inferolateral location. It is non-reversible and while it is most likely due to soft tissue attenuation artifact, scar cannot entirely be excluded given the corresponding ECG abnormalities.  This is a low risk study.  Nuclear stress EF: 63%.  Carotid Dopplers 08/18/2015: IMPRESSION: Minimal to moderate amount of bilateral atherosclerotic plaque, left greater than right, not resulting in a hemodynamically significant stenosis within either internal carotid artery.  Assessment and Plan:  1. Abnormal Cardiolite as detailed above, overall low risk, but not to the exclusion of underlying ischemic heart disease which was our main question. After thorough discussion today, Maria Ritter has  elected to hold off on pursuing a heart catheterization. We discussed diet and reasonable exercise plan, also provided information about diet and a copy of her stress test for review. She will continue current regimen including daily aspirin. Follow-up planned in the next 3 months.  2. Essential hypertension, no changes made to current antihypertensive regimen.  3. Mild to moderate carotid artery disease as outlined above. She is asymptomatic.  Current medicines were reviewed with the patient today.  Disposition: FU with me in 3 months.   Signed, Satira Sark, MD, [facility] 10/28/2015 1:46 PM    [facility] at [facility] [address], [place], [place] [address] Phone: [phone]; Fax: [phone]

## 2015-10-28 NOTE — Patient Instructions (Signed)
Your physician recommends that you schedule a follow-up appointment in: 3 Months with Dr. McDowell.   Your physician recommends that you continue on your current medications as directed. Please refer to the Current Medication list given to you today.  If you need a refill on your cardiac medications before your next appointment, please call your pharmacy.  Thank you for choosing [facility]!    

## 2016-01-16 ENCOUNTER — Other Ambulatory Visit (HOSPITAL_COMMUNITY): Payer: Self-pay | Admitting: Internal Medicine

## 2016-01-16 DIAGNOSIS — Z9889 Other specified postprocedural states: Secondary | ICD-10-CM

## 2016-01-28 ENCOUNTER — Encounter: Payer: Self-pay | Admitting: Cardiology

## 2016-01-28 ENCOUNTER — Ambulatory Visit (INDEPENDENT_AMBULATORY_CARE_PROVIDER_SITE_OTHER): Payer: Medicare Other | Admitting: Cardiology

## 2016-01-28 VITALS — BP 118/70 | HR 93 | Ht 65.0 in | Wt 173.0 lb

## 2016-01-28 DIAGNOSIS — R9430 Abnormal result of cardiovascular function study, unspecified: Secondary | ICD-10-CM | POA: Diagnosis not present

## 2016-01-28 DIAGNOSIS — E782 Mixed hyperlipidemia: Secondary | ICD-10-CM | POA: Diagnosis not present

## 2016-01-28 DIAGNOSIS — I10 Essential (primary) hypertension: Secondary | ICD-10-CM

## 2016-01-28 NOTE — Patient Instructions (Signed)
Your physician wants you to follow-up in: 6 months with Dr Ferne Reus will receive a reminder letter in the mail two months in advance. If you don't receive a letter, please call our office to schedule the follow-up appointment.    Your physician recommends that you continue on your current medications as directed. Please refer to the Current Medication list given to you today.  [address] behind the [employer], new building for seniors has FREE classes   Thank you for choosing [facility] !

## 2016-01-28 NOTE — Progress Notes (Signed)
Cardiology Office Note  Date: 01/28/2016   ID: Maria Ritter, DOB [birth year], MRN [id number]  PCP: Wende Neighbors, MD  Primary Cardiologist: Rozann Lesches, MD   Chief Complaint  Patient presents with  . Cardiac follow-up    History of Present Illness: Maria Ritter is a 70 y.o. female last seen in February. She presents for a follow-up visit. Since last encounter she states that she has carefully evaluated her diet, cut back on fats and carbohydrates, eating more vegetables and fruit. She also [facility] on the [facility]mill [facility] the senior center for about a month, now looking into other exercise options. With this plan she has lost about 10 pounds and states that she feels better. She is not reporting any exertional chest pain.  I reviewed her recent lab work per Dr. Nevada Crane, lipids have improved significantly with LDL down to 77.  Today we discussed continuing lifestyle modification with diet and exercise, holding off on further invasive cardiac testing.  Past Medical History  Diagnosis Date  . Polymyalgia rheumatica ([place])   . Carotid artery disease (HCC)     Mild to moderate bilateral ICA disease, left greater than right 07/2015  . Hypothyroidism   . Hyperlipidemia   . Carcinoma in situ of breast     Excision of tumor followed by radiation therapy in 2010  . Essential hypertension   . GERD (gastroesophageal reflux disease)   . Osteoarthritis   . Glaucoma     Current Outpatient Prescriptions  Medication Sig Dispense Refill  . acetaminophen (TYLENOL) 500 MG tablet Take by mouth.    Marland Kitchen amLODipine (NORVASC) 5 MG tablet Take 5 mg by mouth daily.      Marland Kitchen aspirin 81 MG tablet Take 81 mg by mouth daily.      . calcium citrate-vitamin D (CITRACAL+D) 315-200 MG-UNIT tablet Take by mouth.    . cholecalciferol (VITAMIN D) 400 UNITS TABS Take 400 Units by mouth daily.      . diphenhydrAMINE (BENADRYL) 2 % cream Apply topically as needed.      . fexofenadine (ALLEGRA) 60 MG tablet  Take 60 mg by mouth as needed.      . Flaxseed, Linseed, (FLAXSEED OIL) 1000 MG CAPS Take by mouth.    . folic acid (FOLVITE) 1 MG tablet Take 1 mg by mouth daily.    . hydrochlorothiazide (HYDRODIURIL) 25 MG tablet Take 25 mg by mouth daily as needed.    Marland Kitchen levothyroxine (SYNTHROID, LEVOTHROID) 112 MCG tablet Take 112 mcg by mouth daily.      Marland Kitchen lisinopril (PRINIVIL,ZESTRIL) 20 MG tablet Take 20 mg by mouth daily.      . Misc Natural Products (TART CHERRY ADVANCED) CAPS Take by mouth.    . Multiple Minerals-Vitamins (CITRACAL PLUS) TABS Take 1 tablet by mouth daily.      . naproxen sodium (ANAPROX) 220 MG tablet Take 220 mg by mouth 2 (two) times daily with a meal.    . predniSONE (DELTASONE) 5 MG tablet   0  . raloxifene (EVISTA) 60 MG tablet Take 60 mg by mouth daily.      . Turmeric 500 MG CAPS Take 500 mg by mouth daily.    . vitamin B-12 (CYANOCOBALAMIN) 500 MCG tablet Take 500 mcg by mouth daily.     No current facility-administered medications for this visit.   Allergies:  Erythromycin; Penicillins; and Sulfonamide derivatives   Social History: The patient  reports that she has never smoked. She does not  have any smokeless tobacco history on file. She reports that she does not drink alcohol.   ROS:  Please see the history of present illness. Otherwise, complete review of systems is positive for arthritic symptoms with knee pain.  All other systems are reviewed and negative.   Physical Exam: VS:  BP 118/70 mmHg  Pulse 93  Ht 5\' 5"  (1.651 m)  Wt 173 lb (78.472 kg)  BMI 28.79 kg/m2  SpO2 98%, BMI Body mass index is 28.79 kg/(m^2).  Wt Readings from Last 3 Encounters:  01/28/16 173 lb (78.472 kg)  10/28/15 180 lb (81.647 kg)  10/14/15 180 lb (81.647 kg)    General: Overweight woman, appears comfortable at rest. HEENT: Conjunctiva and lids normal, oropharynx clear. Neck: Supple, no elevated JVP or carotid bruits, no thyromegaly. Lungs: Clear to auscultation, nonlabored  breathing at rest. Cardiac: Regular rate and rhythm, no S3 or significant systolic murmur, no pericardial rub. Abdomen: Soft, nontender, bowel sounds present, no guarding or rebound. Extremities: No pitting edema, distal pulses 2+.  ECG: I personally reviewed the tracing from 10/14/2015 which showed normal sinus rhythm.  Recent Labwork:  December 2016: Potassium 4.7, BUN 13, creatinine 0.7, AST 23, ALT 26, hemoglobin 13.2, platelets 342, cholesterol 174, triglycerides 165, HDL 44, LDL 97, TSH 1.89 April 2017: Cholesterol 150, triglycerides 126, HDL 48, LDL 77  Other Studies Reviewed Today:  Exercise Cardiolite 10/23/2015:  Horizontal ST segment depression ST segment depression was noted during stress in the II leads.  Defect 1: There is a medium defect of mild severity present in the mid inferolateral location. It is non-reversible and while it is most likely due to soft tissue attenuation artifact, scar cannot entirely be excluded given the corresponding ECG abnormalities.  This is a low risk study.  Nuclear stress EF: 63%.  Carotid Dopplers 08/18/2015: IMPRESSION: Minimal to moderate amount of bilateral atherosclerotic plaque, left greater than right, not resulting in a hemodynamically significant stenosis within either internal carotid artery.  Assessment and Plan:  1. Abnormal Cardiolite as outlined above. We have discussed the results in detail, plan at this time is to continue lifestyle modification with diet and exercise. She feels better, has lost nearly 10 pounds, and is not reporting any exertional angina. We discussed possibility of cardiac catheterization, particularly if symptoms progress, however for now we will continue with observation.  2. History of hyperlipidemia, LDL down to 77 with diet and exercise.  3. Essential hypertension, blood pressure is well controlled today.  Current medicines were reviewed with the patient today.  Disposition: FU with me in 6  months.   Signed, Satira Sark, MD, [facility] 01/28/2016 11:23 AM    [facility]. [address], [place], [place] [address] Phone: [phone]; Fax: [phone]

## 2016-03-23 ENCOUNTER — Ambulatory Visit (HOSPITAL_COMMUNITY)
Admission: RE | Admit: 2016-03-23 | Discharge: 2016-03-23 | Disposition: A | Discharge status: Home / Self Care | Payer: Medicare Other | Source: Ambulatory Visit | Attending: Internal Medicine | Admitting: Internal Medicine

## 2016-03-23 DIAGNOSIS — Z9889 Other specified postprocedural states: Secondary | ICD-10-CM

## 2016-03-23 DIAGNOSIS — Z853 Personal history of malignant neoplasm of breast: Secondary | ICD-10-CM | POA: Insufficient documentation

## 2016-03-30 ENCOUNTER — Encounter (HOSPITAL_COMMUNITY): Payer: Medicare Other | Attending: Hematology & Oncology | Admitting: Hematology & Oncology

## 2016-03-30 VITALS — BP 141/75 | HR 80 | Temp 98.9°F | Resp 18 | Ht 65.0 in | Wt 173.0 lb

## 2016-03-30 DIAGNOSIS — D0511 Intraductal carcinoma in situ of right breast: Secondary | ICD-10-CM

## 2016-03-30 DIAGNOSIS — L570 Actinic keratosis: Secondary | ICD-10-CM

## 2016-03-30 DIAGNOSIS — Z780 Asymptomatic menopausal state: Secondary | ICD-10-CM

## 2016-03-30 DIAGNOSIS — Z7952 Long term (current) use of systemic steroids: Secondary | ICD-10-CM

## 2016-03-30 DIAGNOSIS — D0591 Unspecified type of carcinoma in situ of right breast: Secondary | ICD-10-CM

## 2016-03-30 DIAGNOSIS — M2550 Pain in unspecified joint: Secondary | ICD-10-CM

## 2016-03-30 DIAGNOSIS — K625 Hemorrhage of anus and rectum: Secondary | ICD-10-CM

## 2016-03-30 DIAGNOSIS — I10 Essential (primary) hypertension: Secondary | ICD-10-CM | POA: Diagnosis not present

## 2016-03-30 NOTE — Progress Notes (Signed)
[place]  CONSULT NOTE  Patient Care Team: Celene Squibb, MD as PCP - General (Internal Medicine)  CHIEF COMPLAINTS/PURPOSE OF CONSULTATION:  DCIS diagnosed in 2010; Lumpectomy in 2010 followed by radiation. Endometrial ablation in 2002. 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 4 lymph nodes were negative. no microinvasive disease ER +99%. +59% Patient refused Tamoxifen, on Evista   HISTORY OF PRESENTING ILLNESS:  Maria Ritter 70 y.o. female is here because of a history of DCIS of the R breast. She has been followed by Dr. Tressie Stalker. Last seen a year ago. She is on Evista.   Ms. Schreckengost is unaccompanied.  She feels fairly healthy. She has a good appetite.   She continues to take Evista for its positive benefits to bone health. She is not sure when her last bone density scan was but probably has the records at home.   She still has her uterus. She had an ablation in either 2002 or 2003. Her last Pap smear was probably done in 2013.   In 2013 she tripped over her dog and landed on her right knee. She has had trouble with this knee ever since. She was referred to Dr. Amil Amen of rheumatology and he prescribed her prednisone. He is currently tapering her off these steroids. Her next follow up with him is on the 13th. She is curious if the steroid could explain her GI symptoms. She takes turmeric cumin capsules which help her knee pain.   She reports having a bad cough in the spring.  She follows with Dr. Domenic Polite for a history of hyperlipidemia, HTN and an abnormal cardiolite.  She has noticed a couple of times some blood on toilet paper and toilet water. She is not sure if she has hemorrhoids. She is worried since her paternal grandmother passed away from rectal / colon cancer.   She is curious if while eating beets the coloring in the fruit can change the color of stool. She ate two large portions of cooked beets with  syrup and noticed her stool as well as the toilet water was red afterwards. She last ate beets on Tuesday and this morning her stool was still red.   She is planning to have a colonoscopy with Dr. Sydell Axon soon. Her last colonoscopy was in 2013 and it was fine.   She notes a lesion on the right side of her face that she isn't sure if it's cancerous. This area has become darker lately.  The patient is here to establish ongoing care with the recent closure of [facility].    MEDICAL HISTORY:  Past Medical History:  Diagnosis Date  . Carcinoma in situ of breast    Excision of tumor followed by radiation therapy in 2010  . Carotid artery disease (HCC)    Mild to moderate bilateral ICA disease, left greater than right 07/2015  . Essential hypertension   . GERD (gastroesophageal reflux disease)   . Glaucoma   . Hyperlipidemia   . Hypothyroidism   . Osteoarthritis   . Polymyalgia rheumatica ([place] [place])     SURGICAL HISTORY: Past Surgical History:  Procedure Laterality Date  . BASAL CELL CARCINOMA EXCISION  2010   Right breast  . ENDOMETRIAL ABLATION  2002  . FINGER SURGERY     Minor surgical intervention following a crush injury to the left fourth finger    SOCIAL HISTORY: Social History   Social History  . Marital status:  Widowed    Spouse name: N/A  . Number of children: N/A  . Years of education: N/A   Occupational History  . Retired from [employer] Retired   Social History Main Topics  . Smoking status: Never Smoker  . Smokeless tobacco: Not on file  . Alcohol use No  . Drug use: Unknown  . Sexual activity: Not on file   Other Topics Concern  . Not on file   Social History Narrative   Widowed with no children   Widowed. Her husband passed of prostate and pancreatic cancer.  0 children Non smoker Born in [place]. Moved here because her husband was from here.  Worked as a Network engineer She enjoys reading  FAMILY HISTORY: Family History  Problem Relation Age of Onset  .  Pneumonia Mother   . Parkinsonism Mother   . Heart failure Father    Mother deceased at 67 yo of Parkinson's Father deceased at 40 yo of congestive heart failure Paternal grandmother died of rectal / colon cancer No other family history of colon or rectal cancer No family history of breast or ovarian cancer 0 siblings  ALLERGIES:  is allergic to erythromycin; penicillins; and sulfonamide derivatives.  MEDICATIONS:  Current Outpatient Prescriptions  Medication Sig Dispense Refill  . acetaminophen (TYLENOL) 500 MG tablet Take by mouth.    Marland Kitchen amLODipine (NORVASC) 5 MG tablet Take 5 mg by mouth daily.      Marland Kitchen aspirin 81 MG tablet Take 81 mg by mouth daily.      . calcium citrate-vitamin D (CITRACAL+D) 315-200 MG-UNIT tablet Take by mouth.    . cholecalciferol (VITAMIN D) 400 UNITS TABS Take 400 Units by mouth daily.      . diphenhydrAMINE (BENADRYL) 2 % cream Apply topically as needed.      . fexofenadine (ALLEGRA) 60 MG tablet Take 60 mg by mouth as needed.      . Flaxseed, Linseed, (FLAXSEED OIL) 1000 MG CAPS Take by mouth.    . folic acid (FOLVITE) 1 MG tablet Take 1 mg by mouth daily.    Marland Kitchen levothyroxine (SYNTHROID, LEVOTHROID) 112 MCG tablet Take 112 mcg by mouth daily.      Marland Kitchen lisinopril (PRINIVIL,ZESTRIL) 20 MG tablet Take 20 mg by mouth daily.      . Misc Natural Products (TART CHERRY ADVANCED) CAPS Take by mouth.    . predniSONE (DELTASONE) 5 MG tablet Take 1 mg by mouth every 21 ( twenty-one) days.   0  . raloxifene (EVISTA) 60 MG tablet Take 60 mg by mouth daily.      . Turmeric 500 MG CAPS Take 500 mg by mouth daily.    Marland Kitchen amLODipine (NORVASC) 2.5 MG tablet Take by mouth.    . Cholecalciferol (VITAMIN D3) 2000 units TABS Take by mouth.    . Glucosamine-Chondroitin 500-400 MG CAPS Take by mouth.    . Propylene Glycol 0.6 % SOLN Apply to eye.    . vitamin B-12 (CYANOCOBALAMIN) 500 MCG tablet Take 500 mcg by mouth daily.     No current facility-administered medications for this  visit.     Review of Systems  Constitutional: Negative.   HENT: Negative.   Eyes: Negative.   Respiratory: Negative.   Cardiovascular: Negative.   Gastrointestinal: Positive for blood in stool.       Intermittent rectal bleeding secondary to eating beets  Genitourinary: Negative.   Musculoskeletal: Positive for joint pain.       Hip pain  Skin: Negative.  Neurological: Negative.   Endo/Heme/Allergies: Negative.   Psychiatric/Behavioral: Negative.   All other systems reviewed and are negative.  14 point ROS was done and is otherwise as detailed above or in HPI   PHYSICAL EXAMINATION: ECOG PERFORMANCE STATUS: 1 - Symptomatic but completely ambulatory  Vitals:   03/30/16 1451  BP: (!) 141/75  Pulse: 80  Resp: 18  Temp: 98.9 F (37.2 C)   Filed Weights   03/30/16 1451  Weight: 173 lb (78.5 kg)     Physical Exam  Constitutional: She is oriented to person, place, and time and well-developed, well-nourished, and in no distress.  HENT:  Head: Normocephalic and atraumatic.  Mouth/Throat: Oropharynx is clear and moist.  Eyes: Conjunctivae and EOM are normal. Pupils are equal, round, and reactive to light.  Neck: Normal range of motion. Neck supple.  Cardiovascular: Normal rate, regular rhythm and normal heart sounds.   Pulmonary/Chest: Effort normal and breath sounds normal.    Abdominal: Soft. Bowel sounds are normal.  Musculoskeletal: Normal range of motion.  Neurological: She is alert and oriented to person, place, and time. Gait normal.  Skin: Skin is warm and dry.  Keratosis noted on right side of face, benign-looking.  Nursing note and vitals reviewed.   LABORATORY DATA:  I have reviewed the data as listed Lab Results  Component Value Date   WBC 6.4 11/27/2009   HGB 13.5 11/27/2009   HCT 42.6 11/27/2009   MCV 89.5 11/27/2009   PLT 307 11/27/2009   CMP     Component Value Date/Time   NA 140 01/08/2010 1831   K 4.7 01/08/2010 1831   CL 101  01/08/2010 1831   CO2 29 01/08/2010 1831   GLUCOSE 97 01/08/2010 1831   BUN 19 01/08/2010 1831   CREATININE 0.93 01/08/2010 1831   CALCIUM 9.4 01/08/2010 1831   PROT 6.8 01/08/2010 1831   ALBUMIN 3.9 01/08/2010 1831   AST 32 01/08/2010 1831   ALT 45 (H) 01/08/2010 1831   ALKPHOS 75 01/08/2010 1831   BILITOT 0.4 01/08/2010 1831   GFRNONAA >60 11/27/2009 0000   GFRAA  10/21/2008 0952    >60        The eGFR has been calculated using the MDRD equation. This calculation has not been validated in all clinical situations. eGFR's persistently <60 mL/min signify possible Chronic Kidney Disease.     RADIOGRAPHIC STUDIES: I have personally reviewed the radiological images as listed and agreed with the findings in the report. No results found.  Study Result   CLINICAL DATA:  70 year old female with history of right breast cancer post lumpectomy 2010 followed by radiation therapy. EXAM: 2D DIGITAL DIAGNOSTIC BILATERAL MAMMOGRAM WITH CAD AND ADJUNCT TOMO COMPARISON:  Previous exam(s). ACR Breast Density Category b: There are scattered areas of fibroglandular density. FINDINGS: No suspicious masses or calcifications are seen in either breast. Postsurgical changes are present in the upper-outer far posterior right breast related to prior lumpectomy. A spot compression magnification MLO view of the lumpectomy site in the right breast was performed. Continued evolution of benign appearing dystrophic calcifications are again seen. There is no mammographic evidence of locally recurrent malignancy. Mammographic images were processed with CAD. IMPRESSION: No mammographic evidence of malignancy in either breast. RECOMMENDATION: Recommend annual routine screening mammography. I have discussed the findings and recommendations with the patient. Results were also provided in writing at the conclusion of the visit. If applicable, a reminder letter will be sent to the patient regarding the  next appointment. BI-RADS  CATEGORY  2: Benign. Electronically Signed   By: Everlean Alstrom M.D.   On: 03/23/2016 08:47    ASSESSMENT & PLAN: DCIS diagnosed in 2010; Lumpectomy in 2010 followed by radiation.  Endometrial ablation in 2002 Ongoing EVISTA therapy Chronic Joint Pain Hyperlipidemia HTN Prednisone therapy Post menopausal  The patient is here to establish care for a history of DCIS. Breast exam today was performed. She is up to date on mammography.   Last appointment with Dr. Tressie Stalker was on 03/26/15.  Last mammogram performed on 03/23/16. She continues to take Evista.   Encouraged the patient to have a gynecological exam performed in the near future. She would prefer a female gynecologist. She is concerned whether her insurance will cover this referral. The patient will call for a referral after she sees Dr. Sydell Axon.   She reports intermittent rectal bleeding and has already reached out to GI to schedule a colonoscopy soon.  Benign-looking keritosis noted on the right side of patient's face. I offered to make a referral to dermatology. She would not like a referral at this time but will call if the lesion becomes darker and a referral will be made at that time.   I have ordered a bone density scan.   She will return for follow up in 1 year.    MEDICATIONS PRESCRIBED THIS ENCOUNTER: Meds ordered this encounter  Medications  . Glucosamine-Chondroitin 500-400 MG CAPS    Sig: Take by mouth.  . Propylene Glycol 0.6 % SOLN    Sig: Apply to eye.  Marland Kitchen amLODipine (NORVASC) 2.5 MG tablet    Sig: Take by mouth.  . Cholecalciferol (VITAMIN D3) 2000 units TABS    Sig: Take by mouth.     All questions were answered. The patient knows to call the clinic with any problems, questions or concerns.  This document serves as a record of services personally performed by Ancil Linsey, MD. It was created on her behalf by Arlyce Harman, a trained medical scribe. The creation of  this record is based on the scribe's personal observations and the provider's statements to them. This document has been checked and approved by the attending provider.  I have reviewed the above documentation for accuracy and completeness, and I agree with the above.  This note was electronically signed.    Molli Hazard, MD  03/30/2016 3:11 PM

## 2016-03-30 NOTE — Patient Instructions (Signed)
[place] at [facility] Discharge Instructions  RECOMMENDATIONS MADE BY THE CONSULTANT AND ANY TEST RESULTS WILL BE SENT TO YOUR REFERRING PHYSICIAN.  You were seen by Dr. Whitney Muse today. Return to center in 1 year. DEXA scan ordered Please call clinic with any related concerns  Thank you for choosing [place] at [facility] to provide your oncology and hematology care.  To afford each patient quality time with our provider, please arrive at least 15 minutes before your scheduled appointment time.   Beginning January 23rd 2017 lab work for the [facility] will be done in the  Main lab at [facility] on 1st floor. If you have a lab appointment with the [facility] please come in thru the  Main Entrance and check in at the main information desk  You need to re-schedule your appointment should you arrive 10 or more minutes late.  We strive to give you quality time with our providers, and arriving late affects you and other patients whose appointments are after yours.  Also, if you no show three or more times for appointments you may be dismissed from the clinic at the providers discretion.     Again, thank you for choosing [facility].  Our hope is that these requests will decrease the amount of time that you wait before being seen by our physicians.       _____________________________________________________________  Should you have questions after your visit to [facility], please contact our office at [phone] between the hours of 8:30 a.m. and 4:30 p.m.  Voicemails left after 4:30 p.m. will not be returned until the following business day.  For prescription refill requests, have your pharmacy contact our office.         Resources For Cancer Patients and their Caregivers ? American Cancer Society: Can assist with transportation, wigs, general needs, runs Look Good Feel Better.         575-232-0554 ? Cancer Care: Provides financial assistance, online support groups, medication/co-pay assistance.  1-800-813-HOPE 657-130-7812) ? [place] Assists [place] cancer patients and their families through emotional , educational and financial support.  606-516-7431 ? [place] DSS Where to apply for food stamps, Medicaid and utility assistance. 954-672-8614 ? [facility]: Transportation to medical appointments. 930-706-9805 ? Social Security Administration: May apply for disability if have a Stage IV cancer. 585-009-4760 734-673-6764 ? [employer], Disability and Transit Services: Assists with nutrition, care and transit needs. [place] Support Programs: @10RELATIVEDAYS @ > Cancer Support Group  2nd Tuesday of the month 1pm-2pm, Journey Room  > Creative Journey  3rd Tuesday of the month 1130am-1pm, Journey Room  > Look Good Feel Better  1st Wednesday of the month 10am-12 noon, Journey Room (Call [place] to register [phone])

## 2016-03-31 ENCOUNTER — Telehealth: Payer: Self-pay

## 2016-03-31 NOTE — Telephone Encounter (Signed)
[phone]  PATIENT NEEDS TO SCHEDULE A TCS, HER LETTER WAS MAILED TODAY

## 2016-04-05 ENCOUNTER — Telehealth: Payer: Self-pay

## 2016-04-05 NOTE — Telephone Encounter (Signed)
[phone]  PATIENT RECEIVED LETTER TO SCHEDULE TCS

## 2016-04-08 ENCOUNTER — Telehealth: Payer: Self-pay

## 2016-04-08 ENCOUNTER — Ambulatory Visit (HOSPITAL_COMMUNITY)
Admission: RE | Admit: 2016-04-08 | Discharge: 2016-04-08 | Disposition: A | Discharge status: Home / Self Care | Payer: Medicare Other | Source: Ambulatory Visit | Attending: Hematology & Oncology | Admitting: Hematology & Oncology

## 2016-04-08 DIAGNOSIS — M8589 Other specified disorders of bone density and structure, multiple sites: Secondary | ICD-10-CM | POA: Diagnosis not present

## 2016-04-08 DIAGNOSIS — Z7952 Long term (current) use of systemic steroids: Secondary | ICD-10-CM | POA: Insufficient documentation

## 2016-04-08 DIAGNOSIS — Z780 Asymptomatic menopausal state: Secondary | ICD-10-CM | POA: Diagnosis present

## 2016-04-08 NOTE — Telephone Encounter (Signed)
See separate triage.  

## 2016-04-12 NOTE — Telephone Encounter (Signed)
Gastroenterology Pre-Procedure Review  Request Date: 04/08/2016 Requesting Physician: Dr. Wende Neighbors  PATIENT REVIEW QUESTIONS: The patient responded to the following health history questions as indicated:    1. Diabetes Melitis: no 2. Joint replacements in the past 12 months: no 3. Major health problems in the past 3 months: no 4. Has an artificial valve or MVP: no 5. Has a defibrillator: no 6. Has been advised in past to take antibiotics in advance of a procedure like teeth cleaning: no 7. Family history of colon cancer: Grandmother 8. Alcohol Use: no 9. History of sleep apnea: no     MEDICATIONS & ALLERGIES:    Patient reports the following regarding taking any blood thinners:   Plavix? no Aspirin? YES Coumadin? no  Patient confirms/reports the following medications:  Current Outpatient Prescriptions  Medication Sig Dispense Refill  . acetaminophen (TYLENOL) 500 MG tablet Take by mouth.    Marland Kitchen amLODipine (NORVASC) 5 MG tablet Take 5 mg by mouth daily.      Marland Kitchen aspirin 81 MG tablet Take 81 mg by mouth daily.      . calcium citrate-vitamin D (CITRACAL+D) 315-200 MG-UNIT tablet Take by mouth.    . Cholecalciferol (VITAMIN D3) 2000 units TABS Take by mouth.    . diphenhydrAMINE (BENADRYL) 2 % cream Apply topically as needed.      . fexofenadine (ALLEGRA) 60 MG tablet Take 60 mg by mouth as needed.      . Flaxseed, Linseed, (FLAXSEED OIL) 1000 MG CAPS Take by mouth.    . folic acid (FOLVITE) 1 MG tablet Take 1 mg by mouth daily.    . Glucosamine-Chondroitin 500-400 MG CAPS Take by mouth.    . levothyroxine (SYNTHROID, LEVOTHROID) 112 MCG tablet Take 125 mcg by mouth daily.     Marland Kitchen lisinopril (PRINIVIL,ZESTRIL) 20 MG tablet Take 10 mg by mouth daily.     . Misc Natural Products (TART CHERRY ADVANCED) CAPS Take by mouth.    . predniSONE (DELTASONE) 5 MG tablet Take 1 mg by mouth every 21 ( twenty-one) days.   0  . Propylene Glycol 0.6 % SOLN Apply to eye.    . raloxifene (EVISTA) 60 MG  tablet Take 60 mg by mouth daily.      . Turmeric 500 MG CAPS Take 500 mg by mouth daily.    . vitamin B-12 (CYANOCOBALAMIN) 500 MCG tablet Take 500 mcg by mouth daily.    Marland Kitchen amLODipine (NORVASC) 2.5 MG tablet Take by mouth.    . cholecalciferol (VITAMIN D) 400 UNITS TABS Take 400 Units by mouth daily.       No current facility-administered medications for this visit.     Patient confirms/reports the following allergies:  Allergies  Allergen Reactions  . Erythromycin   . Ivp Dye [Iodinated Diagnostic Agents]   . Penicillins   . Sulfonamide Derivatives     No orders of the defined types were placed in this encounter.   AUTHORIZATION INFORMATION Primary Insurance:  ID #:  Group #:  Pre-Cert / Auth required: Pre-Cert / Auth #:   Secondary Insurance: ID #:  Group #:  Pre-Cert / Auth required: Pre-Cert / Auth #:   SCHEDULE INFORMATION: Procedure has been scheduled as follows:  Date:               Time:   Location:   This Gastroenterology Pre-Precedure Review Form is being routed to the following provider(s): R. Garfield Cornea, MD

## 2016-04-12 NOTE — Telephone Encounter (Signed)
Ok to schedule.

## 2016-04-14 ENCOUNTER — Other Ambulatory Visit: Payer: Self-pay

## 2016-04-14 DIAGNOSIS — Z800 Family history of malignant neoplasm of digestive organs: Secondary | ICD-10-CM

## 2016-04-14 MED ORDER — NA SULFATE-K SULFATE-MG SULF 17.5-3.13-1.6 GM/177ML PO SOLN: 1.0000 | ORAL | 0 refills | Status: DC

## 2016-04-14 NOTE — Telephone Encounter (Signed)
Pt called and has been scheduled for 04/26/2016 at 1:45 PM with Dr. Gala Romney.

## 2016-04-14 NOTE — Telephone Encounter (Signed)
Rx sent to the pharmacy and instructions mailed to pt.  

## 2016-04-15 ENCOUNTER — Telehealth: Payer: Self-pay

## 2016-04-15 MED ORDER — PEG 3350-KCL-NA BICARB-NACL 420 G PO SOLR: 4000.0000 mL | ORAL | 0 refills | Status: DC

## 2016-04-15 NOTE — Telephone Encounter (Signed)
Suprep too expensive and I have sent in [place] and mailed new instructions for the pt.

## 2016-04-15 NOTE — Progress Notes (Unsigned)
Maria Ritter   1946/08/18 MRN: [id number]  Procedure Date: *** Time to register: *** Place to register: [facility] Procedure Time: *** Scheduled provider: {RGA PROVIDERS:[id number]}   PREPARATION FOR COLONOSCOPY WITH TRI-LYTE SPLIT PREP  Please notify us immediately if you are diabetic, take iron supplements, or if you are on Coumadin or any other blood thinners.   Please hold the following medications: ***  You will need to purchase 1 fleet enema and 1 box of Bisacodyl 79m tablets.   2 DAYS BEFORE PROCEDURE:  DATE: ***   DAY: {Time; days of week:30300} Begin clear liquid diet AFTER your lunch meal. NO SOLID FOODS after this point.  1 DAY BEFORE PROCEDURE:  DATE: ***   DAY: {Time; days of week:30300} Continue clear liquids the entire day - NO SOLID FOOD.  Diabetic medications adjustments for today: ***  At 2:00 pm:  Take 2 Bisacodyl tablets.   At 4:00pm:  Start drinking your solution. Make sure you mix well per instructions on the bottle. Try to drink 1 (one) 8 ounce glass every 10-15 minutes until you have consumed HALF the jug. You should complete by 6:00pm.You must keep the left over solution refrigerated until completed next day.  Continue clear liquids. You must drink plenty of clear liquids to prevent dehyration and kidney failure. Nothing to eat or drink after midnight.    DAY OF PROCEDURE:   DATE: ***   DAY: {Time; days of week:30300}  Five hours before your procedure time @ ***am:  Finish remaining amout of bowel prep, drinking 1 (one) 8 ounce glass every 10-15 minutes until complete. You have two hours to consume remaining prep.   Three hours before your procedure time @***am:  Nothing by mouth.   At least one hour before going to the hospital:  Give yourself one Fleet enema.  You may take your morning medications with sip of water unless we have instructed otherwise.  If you take medications for your heart, blood pressure, or breathing you should take these  medications with a small amount of clear liquid.           Please see below for Dietary Information.  CLEAR LIQUIDS INCLUDE:  Water Jello (NOT red in color)   Ice Popsicles (NOT red in color)   Tea (sugar ok, no milk/cream) Powdered fruit flavored drinks  Coffee (sugar ok, no milk/cream) Gatorade/ Lemonade/ Kool-Aid  (NOT red in color)   Juice: apple, white grape, white cranberry Soft drinks  Clear bullion, consomme, broth (fat free beef/chicken/vegetable)  Carbonated beverages (any kind)  Strained chicken noodle soup Hard Candy   Remember: Clear liquids are liquids that will allow you to see your fingers on the other side of a clear glass. Be sure liquids are NOT red in color, and not cloudy, but CLEAR.  DO NOT EAT OR DRINK ANY OF THE FOLLOWING:  Dairy products of any kind   Cranberry juice Tomato juice / V8 juice   Grapefruit juice Orange juice     Red grape juice  Do not eat any solid foods, including such foods as: cereal, oatmeal, yogurt, fruits, vegetables, creamed soups, eggs, bread, crackers, pureed foods in a blender, etc.   HELPFUL HINTS FOR DRINKING PREP SOLUTION:   Make sure prep is extremely cold. Mix and refrigerate the the morning of the prep. You may also put in the freezer.   You may try mixing some Crystal Light or Country Time Lemonade if you prefer. Mix in small amounts;  add more if necessary.  Try drinking through a straw  Rinse mouth with water or a mouthwash between glasses, to remove after-taste.  Try sipping on a cold beverage /ice/ popsicles between glasses of prep.  Place a piece of sugar-free hard candy in mouth between glasses.  If you become nauseated, try consuming smaller amounts, or stretch out the time between glasses. Stop for 30-60 minutes, then slowly start back drinking.              OTHER INSTRUCTIONS  You will need a responsible adult at least 71 years of age to accompany you and drive you home. This person must remain  in the waiting room during your procedure. The hospital will cancel your procedure if you do not have a responsible adult with you.   1. Wear loose fitting clothing that is easily removed. 2. Leave jewelry and other valuables at home.  3. Remove all body piercing jewelry and leave at home. 4. Total time from sign-in until discharge is approximately 2-3 hours. 5. You should go home directly after your procedure and rest. You can resume normal activities the day after your procedure. 6. The day of your procedure you should not:  Drive  Make legal decisions  Operate machinery  Drink alcohol  Return to work   You may call the office (Dept: [phone]) before 5:00pm, or page the doctor on call [phone]) after 5:00pm, for further instructions, if necessary.   Insurance Information YOU WILL NEED TO CHECK WITH YOUR INSURANCE COMPANY FOR THE BENEFITS OF COVERAGE YOU HAVE FOR THIS PROCEDURE.  UNFORTUNATELY, NOT ALL INSURANCE COMPANIES HAVE BENEFITS TO COVER ALL OR PART OF THESE TYPES OF PROCEDURES.  IT IS YOUR RESPONSIBILITY TO CHECK YOUR BENEFITS, HOWEVER, WE WILL BE GLAD TO ASSIST YOU WITH ANY CODES YOUR INSURANCE COMPANY MAY NEED.    PLEASE NOTE THAT MOST INSURANCE COMPANIES WILL NOT COVER A SCREENING COLONOSCOPY FOR PEOPLE UNDER THE AGE OF 50  IF YOU HAVE BCBS INSURANCE, YOU MAY HAVE BENEFITS FOR A SCREENING COLONOSCOPY BUT IF POLYPS ARE FOUND THE DIAGNOSIS WILL CHANGE AND THEN YOU MAY HAVE A DEDUCTIBLE THAT WILL NEED TO BE MET. SO PLEASE MAKE SURE YOU CHECK YOUR BENEFITS FOR A SCREENING COLONOSCOPY AS WELL AS A DIAGNOSTIC COLONOSCOPY.

## 2016-04-15 NOTE — Telephone Encounter (Signed)
I tried to call pt. Many rings and no answer. Note attached to the new instructions for this prep.

## 2016-04-15 NOTE — Addendum Note (Signed)
Addended by: Everardo All on: 04/15/2016 11:18 AM   Modules accepted: Orders

## 2016-04-15 NOTE — Telephone Encounter (Signed)
PT called and wanted to cancel her appointment for the colonoscopy on 04/26/2016.   I called and LMOM for Hoyle Sauer to cancel.  Pt had a colonoscopy in 2008 by Dr. Gala Romney ( family hx).  She had her next one in [place] in July 2013 and next due in July 2018.  However, after a recent episode of what appeared to be a rectal bleed at first, she was concerned and wanted to schedule early.  But she realized it was after she had eaten quite a bite of cooked beats, and she saw the red in the comode for a couple of days. Then nothing.  After I had spoken with Neil Crouch, PA. She said it was probably from the beets and we decided not to bring her in the office for an office visit as we usually do for rectal bleeding.   Pt was triaged and scheduled for the colonoscopy, then she decided since she has not had any more episodes that she will wait til 2018.  She said that she will call if she has any rectal bleeding or any problems before 2018.

## 2016-04-16 NOTE — Telephone Encounter (Signed)
Noted  

## 2016-04-20 ENCOUNTER — Telehealth (HOSPITAL_COMMUNITY): Payer: Self-pay | Admitting: *Deleted

## 2016-04-20 NOTE — Telephone Encounter (Signed)
-----   Message from Patrici Ranks, MD sent at 04/18/2016 10:35 AM EDT ----- Osteopenia. Continue calcium, vitamin D. Weight bearing exercise like walking is very good. Can continue Evista. Dr.P

## 2016-04-20 NOTE — Telephone Encounter (Signed)
Pt aware to continue taking Calcium and Vit d and start weight bearing exercise like walking. Pt verbalized understanding.

## 2016-04-21 ENCOUNTER — Other Ambulatory Visit (HOSPITAL_COMMUNITY)
Admission: RE | Admit: 2016-04-21 | Discharge: 2016-04-21 | Disposition: A | Discharge status: Home / Self Care | Payer: Medicare Other | Source: Ambulatory Visit | Attending: Adult Health | Admitting: Adult Health

## 2016-04-21 ENCOUNTER — Encounter: Payer: Self-pay | Admitting: Adult Health

## 2016-04-21 ENCOUNTER — Ambulatory Visit (INDEPENDENT_AMBULATORY_CARE_PROVIDER_SITE_OTHER): Payer: Medicare Other | Admitting: Adult Health

## 2016-04-21 VITALS — BP 150/70 | HR 76 | Ht 64.5 in | Wt 175.0 lb

## 2016-04-21 DIAGNOSIS — Z853 Personal history of malignant neoplasm of breast: Secondary | ICD-10-CM

## 2016-04-21 DIAGNOSIS — Z01419 Encounter for gynecological examination (general) (routine) without abnormal findings: Secondary | ICD-10-CM | POA: Diagnosis present

## 2016-04-21 DIAGNOSIS — Z1211 Encounter for screening for malignant neoplasm of colon: Secondary | ICD-10-CM | POA: Diagnosis not present

## 2016-04-21 DIAGNOSIS — Z124 Encounter for screening for malignant neoplasm of cervix: Secondary | ICD-10-CM | POA: Insufficient documentation

## 2016-04-21 DIAGNOSIS — K640 First degree hemorrhoids: Secondary | ICD-10-CM | POA: Diagnosis not present

## 2016-04-21 DIAGNOSIS — N952 Postmenopausal atrophic vaginitis: Secondary | ICD-10-CM | POA: Diagnosis not present

## 2016-04-21 LAB — HEMOCCULT GUIAC POC 1CARD (OFFICE): Fecal Occult Blood, POC: NEGATIVE

## 2016-04-21 NOTE — Patient Instructions (Addendum)
Pap in 3 years if normal  Mammogram yearly Colonoscopy per [facility] with PCP

## 2016-04-21 NOTE — Progress Notes (Signed)
Subjective:     Patient ID: Maria Ritter, female   DOB: [birth year], 70 y.o.   MRN: [id number]  HPI [name] is a 70 year old white female, widowed in for a pap smear, she is a new pt referred by Dr Whitney Muse.She has a history of breast cancer and had lumpectomy and radiation and is doing well.She had an endometrial ablation in about 2003 for bleeding.She says she was going to have colonoscopy next week by Dr Gala Romney but cancelled, she says she had eaten beets and stool was red and so she spoke with his office and cancelled colonoscopy, she said she had one just 4 years ago and it was normal.  PCP is Dr Wende Neighbors.  Review of Systems She denies being sexually active and has no problems with urination or Bowel movements. Reviewed past medical,surgical, social and family history. Reviewed medications and allergies.     Objective:   Physical Exam BP (!) 150/70 (BP Location: Left Arm, Patient Position: Sitting, Cuff Size: Normal)   Pulse 76   Ht 5' 4.5" (1.638 m)   Wt 175 lb (79.4 kg)   BMI 29.57 kg/m  Skin warm and dry.Pelvic: external genitalia is normal in appearance for age, no lesions, vagina: pale, with loss of rugae and moisture, atrophic,urethra has no lesions or masses noted, cervix is atrophic and stenotic at os, pap with HPV performed, uterus: normal size, shape and contour, non tender, no masses felt, adnexa: no masses or tenderness noted. Bladder is non tender and no masses felt.Rectal exam was positive for small internal hemorrhoid, has good tone and hemoccult was negative.Pt to call Dr Gala Romney back if nay changes in BMs.Discussed could stop pap smears if desires if next this one and next one is normal, she has not had abnormal one that she is aware of in last 10 years    Face time 20 minutes.  Assessment:     Routine cervical smear - Plan: Cytology - PAP, POCT occult blood stool  History of breast cancer in female  Vaginal atrophy  First degree hemorrhoids     Plan:     Pap in  3 years if normal and can stop if desires Mammogram yearly  Colonoscopy per [facility] with PCP

## 2016-04-22 LAB — CYTOLOGY - PAP

## 2016-04-25 ENCOUNTER — Encounter (HOSPITAL_COMMUNITY): Payer: Self-pay | Admitting: Hematology & Oncology

## 2016-04-26 ENCOUNTER — Encounter (HOSPITAL_COMMUNITY): Payer: Self-pay

## 2016-04-26 ENCOUNTER — Ambulatory Visit (HOSPITAL_COMMUNITY): Admit: 2016-04-26 | Payer: Medicare Other | Admitting: Internal Medicine

## 2016-04-26 SURGERY — COLONOSCOPY
Anesthesia: Moderate Sedation

## 2016-07-21 NOTE — Progress Notes (Signed)
Cardiology Office Note  Date: 07/26/2016   ID: MELEANE Maria Ritter, DOB March 27, 1946, MRN [id number]  PCP: Wende Neighbors, MD  Primary Cardiologist: Rozann Lesches, MD   Chief Complaint  Patient presents with  . Cardiac follow-up    History of Present Illness: Maria Ritter is a 70 y.o. female last seen in May. She presents for a routine follow-up visit. Since last encounter, she denies any significant exertional chest pain. She is limited by arthritic pain in her hips and knees, but still is able to exercise. She has been working twice weekly in the maintenance exercise program here at [facility], also about a treadmill to use at home. Her weight has been stable.  I reviewed her medications which are outlined below. She remains on prednisone for treatment of her arthritis, follows with rheumatology. She has been concerned about switching to methotrexate due to potential side effects.  Past Medical History:  Diagnosis Date  . Breast disorder   . Carcinoma in situ of breast    Excision of tumor followed by radiation therapy in 2010  . Carotid artery disease (HCC)    Mild to moderate bilateral ICA disease, left greater than right 07/2015  . Essential hypertension   . GERD (gastroesophageal reflux disease)   . Glaucoma   . Hyperlipidemia   . Hypothyroidism   . Osteoarthritis   . Polymyalgia rheumatica ([place])     Current Outpatient Prescriptions  Medication Sig Dispense Refill  . amLODipine (NORVASC) 5 MG tablet Take 5 mg by mouth daily.      Marland Kitchen aspirin 81 MG tablet Take 81 mg by mouth daily.      . Benzocaine (CHIGGEREX EX) Apply topically as needed.    . calcium citrate-vitamin D (CITRACAL+D) 315-200 MG-UNIT tablet Take 1 tablet by mouth daily.     . Cholecalciferol (VITAMIN D3) 2000 units TABS Take by mouth 2 (two) times daily.     . diphenhydrAMINE (BENADRYL) 12.5 MG/5ML elixir Take by mouth as needed.    . fexofenadine (ALLEGRA) 180 MG tablet Take 180 mg by mouth daily as needed  for allergies or rhinitis.    . Flaxseed, Linseed, (FLAXSEED OIL) 1000 MG CAPS Take by mouth daily.     . folic acid (FOLVITE) 1 MG tablet Take 1 mg by mouth daily.    . Glucosamine-Chondroitin 500-400 MG CAPS Take by mouth 2 (two) times daily.     . hydrocortisone cream 1 % Apply 1 application topically as needed for itching.    . levothyroxine (SYNTHROID, LEVOTHROID) 125 MCG tablet Take 125 mcg by mouth daily before breakfast.    . lisinopril (PRINIVIL,ZESTRIL) 10 MG tablet Take 10 mg by mouth daily.    . Misc Natural Products (TART CHERRY ADVANCED) CAPS Take by mouth daily.     . naproxen sodium (ANAPROX) 220 MG tablet Take 220 mg by mouth as needed.    Vladimir Faster Glycol-Propyl Glycol (SYSTANE OP) Apply to eye daily.    . Pramoxine-Benzyl Alcohol (ITCH-X EX) Apply topically as needed.    . predniSONE (DELTASONE) 1 MG tablet Take 4 mg by mouth daily with breakfast.     . raloxifene (EVISTA) 60 MG tablet Take 60 mg by mouth daily.      . sodium chloride (OCEAN) 0.65 % SOLN nasal spray Place 1 spray into both nostrils as needed for congestion.    . Turmeric 500 MG CAPS Take 500 mg by mouth daily.    . vitamin B-12 (CYANOCOBALAMIN) 500  MCG tablet Take 1,000 mcg by mouth daily.      No current facility-administered medications for this visit.    Allergies:  Mango flavor; Erythromycin; Ivp dye [iodinated diagnostic agents]; Penicillins; and Sulfonamide derivatives   Social History: The patient  reports that she has never smoked. She has never used smokeless tobacco. She reports that she does not drink alcohol or use drugs.   ROS:  Please see the history of present illness. Otherwise, complete review of systems is positive for arthritic pains.  All other systems are reviewed and negative.   Physical Exam: VS:  BP 130/70   Pulse 81   Ht 5\' 5"  (1.651 m)   Wt 175 lb (79.4 kg)   SpO2 94%   BMI 29.12 kg/m , BMI Body mass index is 29.12 kg/m.  Wt Readings from Last 3 Encounters:  07/26/16  175 lb (79.4 kg)  04/21/16 175 lb (79.4 kg)  03/30/16 173 lb (78.5 kg)    General: Overweight woman, appears comfortable at rest. HEENT: Conjunctiva and lids normal, oropharynx clear. Neck: Supple, no elevated JVP or carotid bruits, no thyromegaly. Lungs: Clear to auscultation, nonlabored breathing at rest. Cardiac: Regular rate and rhythm, no S3 or significant systolic murmur, no pericardial rub. Abdomen: Soft, nontender, bowel sounds present, no guarding or rebound. Extremities: No pitting edema, distal pulses 2+.  ECG: I personally reviewed the tracing from 10/14/2015 which showed normal sinus rhythm.  Recent Labwork:  April 2017: Potassium 4.4, BUN 15, creatinine 0.8, AST 20, ALT 22, hemoglobin 13.3, platelets 329, cholesterol 150, triglycerides 126, HDL 48, LDL 77, TSH 1.03  Other Studies Reviewed Today:  Exercise Cardiolite 10/23/2015:  Horizontal ST segment depression ST segment depression was noted during stress in the II leads.  Defect 1: There is a medium defect of mild severity present in the mid inferolateral location. It is non-reversible and while it is most likely due to soft tissue attenuation artifact, scar cannot entirely be excluded given the corresponding ECG abnormalities.  This is a low risk study.  Nuclear stress EF: 63%.  Carotid Dopplers 08/18/2015: IMPRESSION: Minimal to moderate amount of bilateral atherosclerotic plaque, left greater than right, not resulting in a hemodynamically significant stenosis within either internal carotid artery.  Assessment and Plan:  1. No recurring chest pain symptoms. She underwent stress testing earlier this year as outlined above, overall low risk and we have continued with observation and recommendation for exercise plan. Otherwise she remains on aspirin, lisinopril, and Norvasc.  2. Essential hypertension, blood pressure control is adequate today. Keep follow-up with Dr. [place] Crane.  Current medicines were reviewed with  the patient today.  Disposition: Follow-up in one year.  Signed, Satira Sark, MD, [facility] 07/26/2016 10:16 AM    [place] at [facility] [address] [place],  [address] Phone: [phone]; Fax: [phone]

## 2016-07-26 ENCOUNTER — Ambulatory Visit (INDEPENDENT_AMBULATORY_CARE_PROVIDER_SITE_OTHER): Payer: Medicare Other | Admitting: Cardiology

## 2016-07-26 ENCOUNTER — Encounter: Payer: Self-pay | Admitting: Cardiology

## 2016-07-26 VITALS — BP 130/70 | HR 81 | Ht 65.0 in | Wt 175.0 lb

## 2016-07-26 DIAGNOSIS — Z87898 Personal history of other specified conditions: Secondary | ICD-10-CM | POA: Diagnosis not present

## 2016-07-26 DIAGNOSIS — I10 Essential (primary) hypertension: Secondary | ICD-10-CM

## 2016-07-26 NOTE — Patient Instructions (Signed)

## 2016-09-08 DIAGNOSIS — R69 Illness, unspecified: Secondary | ICD-10-CM | POA: Diagnosis not present

## 2016-10-25 DIAGNOSIS — M25561 Pain in right knee: Secondary | ICD-10-CM | POA: Diagnosis not present

## 2016-10-25 DIAGNOSIS — L4059 Other psoriatic arthropathy: Secondary | ICD-10-CM | POA: Diagnosis not present

## 2016-10-25 DIAGNOSIS — Z7952 Long term (current) use of systemic steroids: Secondary | ICD-10-CM | POA: Diagnosis not present

## 2016-10-25 DIAGNOSIS — Z6829 Body mass index (BMI) 29.0-29.9, adult: Secondary | ICD-10-CM | POA: Diagnosis not present

## 2016-10-25 DIAGNOSIS — M25551 Pain in right hip: Secondary | ICD-10-CM | POA: Diagnosis not present

## 2016-10-25 DIAGNOSIS — M5030 Other cervical disc degeneration, unspecified cervical region: Secondary | ICD-10-CM | POA: Diagnosis not present

## 2016-10-25 DIAGNOSIS — E663 Overweight: Secondary | ICD-10-CM | POA: Diagnosis not present

## 2016-12-22 DIAGNOSIS — L4059 Other psoriatic arthropathy: Secondary | ICD-10-CM | POA: Diagnosis not present

## 2016-12-22 DIAGNOSIS — E559 Vitamin D deficiency, unspecified: Secondary | ICD-10-CM | POA: Diagnosis not present

## 2016-12-22 DIAGNOSIS — E039 Hypothyroidism, unspecified: Secondary | ICD-10-CM | POA: Diagnosis not present

## 2016-12-22 DIAGNOSIS — I10 Essential (primary) hypertension: Secondary | ICD-10-CM | POA: Diagnosis not present

## 2016-12-24 DIAGNOSIS — L570 Actinic keratosis: Secondary | ICD-10-CM | POA: Diagnosis not present

## 2016-12-24 DIAGNOSIS — I6529 Occlusion and stenosis of unspecified carotid artery: Secondary | ICD-10-CM | POA: Diagnosis not present

## 2016-12-24 DIAGNOSIS — K219 Gastro-esophageal reflux disease without esophagitis: Secondary | ICD-10-CM | POA: Diagnosis not present

## 2016-12-24 DIAGNOSIS — M353 Polymyalgia rheumatica: Secondary | ICD-10-CM | POA: Diagnosis not present

## 2016-12-24 DIAGNOSIS — R7301 Impaired fasting glucose: Secondary | ICD-10-CM | POA: Diagnosis not present

## 2016-12-24 DIAGNOSIS — Z6835 Body mass index (BMI) 35.0-35.9, adult: Secondary | ICD-10-CM | POA: Diagnosis not present

## 2016-12-24 DIAGNOSIS — C50919 Malignant neoplasm of unspecified site of unspecified female breast: Secondary | ICD-10-CM | POA: Diagnosis not present

## 2016-12-24 DIAGNOSIS — M179 Osteoarthritis of knee, unspecified: Secondary | ICD-10-CM | POA: Diagnosis not present

## 2016-12-24 DIAGNOSIS — I10 Essential (primary) hypertension: Secondary | ICD-10-CM | POA: Diagnosis not present

## 2016-12-24 DIAGNOSIS — E039 Hypothyroidism, unspecified: Secondary | ICD-10-CM | POA: Diagnosis not present

## 2017-01-03 ENCOUNTER — Other Ambulatory Visit (HOSPITAL_COMMUNITY): Payer: Self-pay | Admitting: Internal Medicine

## 2017-01-03 DIAGNOSIS — Z1231 Encounter for screening mammogram for malignant neoplasm of breast: Secondary | ICD-10-CM

## 2017-01-03 DIAGNOSIS — R69 Illness, unspecified: Secondary | ICD-10-CM | POA: Diagnosis not present

## 2017-01-04 DIAGNOSIS — Z1212 Encounter for screening for malignant neoplasm of rectum: Secondary | ICD-10-CM | POA: Diagnosis not present

## 2017-01-04 DIAGNOSIS — Z1211 Encounter for screening for malignant neoplasm of colon: Secondary | ICD-10-CM | POA: Diagnosis not present

## 2017-01-06 DIAGNOSIS — L821 Other seborrheic keratosis: Secondary | ICD-10-CM | POA: Diagnosis not present

## 2017-01-06 DIAGNOSIS — X32XXXD Exposure to sunlight, subsequent encounter: Secondary | ICD-10-CM | POA: Diagnosis not present

## 2017-01-06 DIAGNOSIS — L570 Actinic keratosis: Secondary | ICD-10-CM | POA: Diagnosis not present

## 2017-01-17 DIAGNOSIS — I739 Peripheral vascular disease, unspecified: Secondary | ICD-10-CM | POA: Diagnosis not present

## 2017-01-19 DIAGNOSIS — Z6829 Body mass index (BMI) 29.0-29.9, adult: Secondary | ICD-10-CM | POA: Diagnosis not present

## 2017-01-19 DIAGNOSIS — M25551 Pain in right hip: Secondary | ICD-10-CM | POA: Diagnosis not present

## 2017-01-19 DIAGNOSIS — Z7952 Long term (current) use of systemic steroids: Secondary | ICD-10-CM | POA: Diagnosis not present

## 2017-01-19 DIAGNOSIS — R220 Localized swelling, mass and lump, head: Secondary | ICD-10-CM | POA: Diagnosis not present

## 2017-01-19 DIAGNOSIS — M25561 Pain in right knee: Secondary | ICD-10-CM | POA: Diagnosis not present

## 2017-01-19 DIAGNOSIS — L4059 Other psoriatic arthropathy: Secondary | ICD-10-CM | POA: Diagnosis not present

## 2017-01-19 DIAGNOSIS — M5030 Other cervical disc degeneration, unspecified cervical region: Secondary | ICD-10-CM | POA: Diagnosis not present

## 2017-01-19 DIAGNOSIS — E663 Overweight: Secondary | ICD-10-CM | POA: Diagnosis not present

## 2017-01-31 DIAGNOSIS — D3709 Neoplasm of uncertain behavior of other specified sites of the oral cavity: Secondary | ICD-10-CM | POA: Diagnosis not present

## 2017-02-07 DIAGNOSIS — Z6834 Body mass index (BMI) 34.0-34.9, adult: Secondary | ICD-10-CM | POA: Diagnosis not present

## 2017-02-07 DIAGNOSIS — L719 Rosacea, unspecified: Secondary | ICD-10-CM | POA: Diagnosis not present

## 2017-02-12 DIAGNOSIS — L550 Sunburn of first degree: Secondary | ICD-10-CM | POA: Diagnosis not present

## 2017-02-14 DIAGNOSIS — Z961 Presence of intraocular lens: Secondary | ICD-10-CM | POA: Diagnosis not present

## 2017-02-14 DIAGNOSIS — H40013 Open angle with borderline findings, low risk, bilateral: Secondary | ICD-10-CM | POA: Diagnosis not present

## 2017-02-22 DIAGNOSIS — L259 Unspecified contact dermatitis, unspecified cause: Secondary | ICD-10-CM | POA: Diagnosis not present

## 2017-03-21 DIAGNOSIS — M353 Polymyalgia rheumatica: Secondary | ICD-10-CM | POA: Diagnosis not present

## 2017-03-21 DIAGNOSIS — L259 Unspecified contact dermatitis, unspecified cause: Secondary | ICD-10-CM | POA: Diagnosis not present

## 2017-03-21 DIAGNOSIS — R609 Edema, unspecified: Secondary | ICD-10-CM | POA: Diagnosis not present

## 2017-03-24 ENCOUNTER — Ambulatory Visit (HOSPITAL_COMMUNITY)
Admission: RE | Admit: 2017-03-24 | Discharge: 2017-03-24 | Disposition: A | Discharge status: Home / Self Care | Payer: Medicare HMO | Source: Ambulatory Visit | Attending: Internal Medicine | Admitting: Internal Medicine

## 2017-03-24 ENCOUNTER — Encounter (HOSPITAL_COMMUNITY): Payer: Self-pay

## 2017-03-24 DIAGNOSIS — Z1231 Encounter for screening mammogram for malignant neoplasm of breast: Secondary | ICD-10-CM | POA: Insufficient documentation

## 2017-03-28 ENCOUNTER — Other Ambulatory Visit (HOSPITAL_COMMUNITY): Payer: Self-pay | Admitting: Internal Medicine

## 2017-03-28 DIAGNOSIS — M7989 Other specified soft tissue disorders: Secondary | ICD-10-CM

## 2017-03-31 ENCOUNTER — Ambulatory Visit (HOSPITAL_COMMUNITY): Payer: Medicare HMO

## 2017-03-31 ENCOUNTER — Other Ambulatory Visit (HOSPITAL_COMMUNITY): Payer: Self-pay | Admitting: Internal Medicine

## 2017-03-31 DIAGNOSIS — I739 Peripheral vascular disease, unspecified: Secondary | ICD-10-CM

## 2017-04-01 ENCOUNTER — Ambulatory Visit (HOSPITAL_COMMUNITY): Payer: Medicare Other | Admitting: Adult Health

## 2017-04-01 ENCOUNTER — Ambulatory Visit (HOSPITAL_COMMUNITY): Payer: Medicare Other

## 2017-04-04 ENCOUNTER — Ambulatory Visit (HOSPITAL_COMMUNITY)
Admission: RE | Admit: 2017-04-04 | Discharge: 2017-04-04 | Disposition: A | Discharge status: Home / Self Care | Payer: Medicare HMO | Source: Ambulatory Visit | Attending: Internal Medicine | Admitting: Internal Medicine

## 2017-04-04 DIAGNOSIS — R600 Localized edema: Secondary | ICD-10-CM | POA: Diagnosis not present

## 2017-04-04 DIAGNOSIS — M7989 Other specified soft tissue disorders: Secondary | ICD-10-CM | POA: Insufficient documentation

## 2017-04-18 DIAGNOSIS — Z961 Presence of intraocular lens: Secondary | ICD-10-CM | POA: Diagnosis not present

## 2017-04-18 DIAGNOSIS — H40013 Open angle with borderline findings, low risk, bilateral: Secondary | ICD-10-CM | POA: Diagnosis not present

## 2017-04-18 DIAGNOSIS — L564 Polymorphous light eruption: Secondary | ICD-10-CM | POA: Diagnosis not present

## 2017-04-19 DIAGNOSIS — L258 Unspecified contact dermatitis due to other agents: Secondary | ICD-10-CM | POA: Diagnosis not present

## 2017-04-20 DIAGNOSIS — R220 Localized swelling, mass and lump, head: Secondary | ICD-10-CM | POA: Diagnosis not present

## 2017-04-20 DIAGNOSIS — Z7952 Long term (current) use of systemic steroids: Secondary | ICD-10-CM | POA: Diagnosis not present

## 2017-04-20 DIAGNOSIS — M25551 Pain in right hip: Secondary | ICD-10-CM | POA: Diagnosis not present

## 2017-04-20 DIAGNOSIS — E663 Overweight: Secondary | ICD-10-CM | POA: Diagnosis not present

## 2017-04-20 DIAGNOSIS — M25561 Pain in right knee: Secondary | ICD-10-CM | POA: Diagnosis not present

## 2017-04-20 DIAGNOSIS — M5030 Other cervical disc degeneration, unspecified cervical region: Secondary | ICD-10-CM | POA: Diagnosis not present

## 2017-04-20 DIAGNOSIS — L4059 Other psoriatic arthropathy: Secondary | ICD-10-CM | POA: Diagnosis not present

## 2017-04-29 ENCOUNTER — Ambulatory Visit (HOSPITAL_COMMUNITY): Payer: Medicare HMO

## 2017-05-19 ENCOUNTER — Encounter (HOSPITAL_COMMUNITY): Payer: Self-pay | Admitting: Oncology

## 2017-05-19 ENCOUNTER — Encounter (HOSPITAL_COMMUNITY): Payer: Medicare HMO | Attending: Oncology | Admitting: Oncology

## 2017-05-19 VITALS — BP 136/68 | HR 72 | Temp 98.7°F | Resp 18 | Wt 174.8 lb

## 2017-05-19 DIAGNOSIS — I10 Essential (primary) hypertension: Secondary | ICD-10-CM | POA: Diagnosis not present

## 2017-05-19 DIAGNOSIS — D0511 Intraductal carcinoma in situ of right breast: Secondary | ICD-10-CM

## 2017-05-19 DIAGNOSIS — Z79810 Long term (current) use of selective estrogen receptor modulators (SERMs): Secondary | ICD-10-CM | POA: Diagnosis not present

## 2017-05-19 NOTE — Patient Instructions (Signed)
[facility] at [facility] Discharge Instructions  RECOMMENDATIONS MADE BY THE CONSULTANT AND ANY TEST RESULTS WILL BE SENT TO YOUR REFERRING PHYSICIAN.  You were seen today by Dr. Zhou.   Thank you for choosing [facility] at [facility] to provide your oncology and hematology care.  To afford each patient quality time with our provider, please arrive at least 15 minutes before your scheduled appointment time.    If you have a lab appointment with the Cancer Center please come in thru the  Main Entrance and check in at the main information desk  You need to re-schedule your appointment should you arrive 10 or more minutes late.  We strive to give you quality time with our providers, and arriving late affects you and other patients whose appointments are after yours.  Also, if you no show three or more times for appointments you may be dismissed from the clinic at the providers discretion.     Again, thank you for choosing [facility].  Our hope is that these requests will decrease the amount of time that you wait before being seen by our physicians.       _____________________________________________________________  Should you have questions after your visit to  Cancer Center, please contact our office at [phone] between the hours of 8:30 a.m. and 4:30 p.m.  Voicemails left after 4:30 p.m. will not be returned until the following business day.  For prescription refill requests, have your pharmacy contact our office.       Resources For Cancer Patients and their Caregivers ? American Cancer Society: Can assist with transportation, wigs, general needs, runs Look Good Feel Better.        1-888-227-6333 ? Cancer Care: Provides financial assistance, online support groups, medication/co-pay assistance.  1-800-813-HOPE (4673) ? [facility] Assists [place] cancer patients and their families  through emotional , educational and financial support.  336-427-4357 ? [place] DSS Where to apply for food stamps, Medicaid and utility assistance. 336-342-1394 ? [facility]: Transportation to medical appointments. 336-347-2287 ? Social Security Administration: May apply for disability if have a Stage IV cancer. 336-342-7796 1-800-772-1213 ? [place] Aging, Disability and Transit Services: Assists with nutrition, care and transit needs. 336-349-2343  Cancer Center Support Programs: @10RELATIVEDAYS@ > Cancer Support Group  2nd Tuesday of the month 1pm-2pm, Journey Room  > Creative Journey  3rd Tuesday of the month 1130am-1pm, Journey Room  > Look Good Feel Better  1st Wednesday of the month 10am-12 noon, Journey Room (Call American Cancer Society to register 1-800-395-5775)    

## 2017-05-19 NOTE — Progress Notes (Signed)
[facility]  CONSULT NOTE  Patient Care Team: Celene Squibb, MD as PCP - General (Internal Medicine)  CHIEF COMPLAINTS/PURPOSE OF CONSULTATION:  DCIS diagnosed in 2010; Lumpectomy in 2010 followed by radiation. Endometrial ablation in 2002. 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 4 lymph nodes were negative. no microinvasive disease ER +99%. +59% Patient refused Tamoxifen, on Evista   HISTORY OF PRESENTING ILLNESS:  Maria Ritter 71 y.o. female is here because of a history of DCIS of the R breast. She has been followed by Dr. Tressie Stalker. Last seen a year ago. She is on Evista.   Patient states that she is doing well. She had her bilateral screening mammogram in July 2018 and it was negative for malignancy. She states she has not had any changes in her health since her last visit. She continues to take Evista for its positive benefits to bone health.     MEDICAL HISTORY:  Past Medical History:  Diagnosis Date  . Breast disorder   . Cancer [facility]) 2010   Right Lumpectomy  . Carcinoma in situ of breast    Excision of tumor followed by radiation therapy in 2010  . Carotid artery disease (HCC)    Mild to moderate bilateral ICA disease, left greater than right 07/2015  . Essential hypertension   . GERD (gastroesophageal reflux disease)   . Glaucoma   . Hyperlipidemia   . Hypothyroidism   . Osteoarthritis   . Polymyalgia rheumatica ([place])     SURGICAL HISTORY: Past Surgical History:  Procedure Laterality Date  . BASAL CELL CARCINOMA EXCISION  2010   Right breast  . BREAST LUMPECTOMY Right 2010  . ENDOMETRIAL ABLATION  2002  . FINGER SURGERY     Minor surgical intervention following a crush injury to the left fourth finger    SOCIAL HISTORY: Social History   Social History  . Marital status: Widowed    Spouse name: N/A  . Number of children: N/A  . Years of education: N/A   Occupational History  .  Retired from [employer] Retired   Social History Main Topics  . Smoking status: Never Smoker  . Smokeless tobacco: Never Used  . Alcohol use No  . Drug use: No  . Sexual activity: Not Currently    Birth control/ protection: Surgical     Comment: ablation   Other Topics Concern  . Not on file   Social History Narrative   Widowed with no children   Widowed. Her husband passed of prostate and pancreatic cancer.  0 children Non smoker Born in [place]. Moved here because her husband was from here.  Worked as a Network engineer She enjoys reading  FAMILY HISTORY: Family History  Problem Relation Age of Onset  . Pneumonia Mother   . Parkinsonism Mother   . Heart failure Father    Mother deceased at 82 yo of Parkinson's Father deceased at 65 yo of congestive heart failure Paternal grandmother died of rectal / colon cancer No other family history of colon or rectal cancer No family history of breast or ovarian cancer 0 siblings  ALLERGIES:  is allergic to mango flavor; erythromycin; ivp dye [iodinated diagnostic agents]; penicillins; and sulfonamide derivatives.  MEDICATIONS:  Current Outpatient Prescriptions  Medication Sig Dispense Refill  . amLODipine (NORVASC) 5 MG tablet Take 5 mg by mouth daily.      Marland Kitchen aspirin 81 MG tablet Take 81 mg by mouth daily.      Marland Kitchen  CALCIUM CITRATE-VITAMIN D PO Take 1 tablet by mouth daily. 630 mg Calcium 500 mg Vitamin D    . Cholecalciferol (VITAMIN D3) 2000 units TABS Take by mouth 2 (two) times daily.     . diphenhydrAMINE (BENADRYL) 12.5 MG/5ML elixir Take by mouth as needed.    . diphenhydrAMINE (BENADRYL) 25 MG tablet Take 25 mg by mouth every 6 (six) hours as needed.    . fexofenadine (ALLEGRA) 180 MG tablet Take 180 mg by mouth daily as needed for allergies or rhinitis.    . Flaxseed, Linseed, (FLAXSEED OIL) 1000 MG CAPS Take by mouth daily.     Marland Kitchen GLUCOSAMINE-CHONDROITIN PO Take 1 tablet by mouth 2 (two) times daily. Glucosamine 1500 mg  Chondroitin 1200 mg    . hydrocortisone cream 1 % Apply 1 application topically as needed for itching.    . levothyroxine (SYNTHROID, LEVOTHROID) 125 MCG tablet Take 125 mcg by mouth daily before breakfast.    . lisinopril (PRINIVIL,ZESTRIL) 10 MG tablet Take 10 mg by mouth daily.    . Misc Natural Products (TART CHERRY ADVANCED) CAPS Take 1 capsule by mouth daily. 1200 mg    . naproxen sodium (ANAPROX) 220 MG tablet Take 220 mg by mouth as needed.    . Pramoxine-Benzyl Alcohol (ITCH-X EX) Apply topically as needed. ITCH-X    . raloxifene (EVISTA) 60 MG tablet Take 60 mg by mouth daily.      . sodium chloride (OCEAN) 0.65 % SOLN nasal spray Place 1 spray into both nostrils as needed for congestion.    . Turmeric 500 MG CAPS Take 500 mg by mouth daily.    . vitamin B-12 (CYANOCOBALAMIN) 500 MCG tablet Take 1,000 mcg by mouth daily.      No current facility-administered medications for this visit.     Review of Systems  Constitutional: Negative.   HENT: Negative.   Eyes: Negative.   Respiratory: Negative.   Cardiovascular: Negative.   Gastrointestinal: Negative.  Negative for blood in stool.  Genitourinary: Negative.   Musculoskeletal: Negative.  Negative for joint pain.  Skin: Negative.   Neurological: Negative.   Endo/Heme/Allergies: Negative.   Psychiatric/Behavioral: Negative.   All other systems reviewed and are negative.  14 point ROS was done and is otherwise as detailed above or in HPI   PHYSICAL EXAMINATION: ECOG PERFORMANCE STATUS: 1 - Symptomatic but completely ambulatory  Vitals:   05/19/17 1208  BP: 136/68  Pulse: 72  Resp: 18  Temp: 98.7 F (37.1 C)  SpO2: 99%   Filed Weights   05/19/17 1208  Weight: 174 lb 12.8 oz (79.3 kg)     Physical Exam  Constitutional: She is oriented to person, place, and time and well-developed, well-nourished, and in no distress.  HENT:  Head: Normocephalic and atraumatic.  Mouth/Throat: Oropharynx is clear and moist.    Eyes: Pupils are equal, round, and reactive to light. Conjunctivae and EOM are normal.  Neck: Normal range of motion. Neck supple.  Cardiovascular: Normal rate, regular rhythm and normal heart sounds.   Pulmonary/Chest: Effort normal and breath sounds normal.    Abdominal: Soft. Bowel sounds are normal.  Musculoskeletal: Normal range of motion.  Neurological: She is alert and oriented to person, place, and time. Gait normal.  Skin: Skin is warm and dry.  Nursing note and vitals reviewed.   LABORATORY DATA:  I have reviewed the data as listed Lab Results  Component Value Date   WBC 6.4 11/27/2009   HGB 13.5 11/27/2009  HCT 42.6 11/27/2009   MCV 89.5 11/27/2009   PLT 307 11/27/2009   CMP     Component Value Date/Time   NA 140 01/08/2010 1831   K 4.7 01/08/2010 1831   CL 101 01/08/2010 1831   CO2 29 01/08/2010 1831   GLUCOSE 97 01/08/2010 1831   BUN 19 01/08/2010 1831   CREATININE 0.93 01/08/2010 1831   CALCIUM 9.4 01/08/2010 1831   PROT 6.8 01/08/2010 1831   ALBUMIN 3.9 01/08/2010 1831   AST 32 01/08/2010 1831   ALT 45 (H) 01/08/2010 1831   ALKPHOS 75 01/08/2010 1831   BILITOT 0.4 01/08/2010 1831   GFRNONAA >60 11/27/2009 0000   GFRAA  10/21/2008 0952    >60        The eGFR has been calculated using the MDRD equation. This calculation has not been validated in all clinical situations. eGFR's persistently <60 mL/min signify possible Chronic Kidney Disease.     RADIOGRAPHIC STUDIES: I have personally reviewed the radiological images as listed and agreed with the findings in the report. No results found.  Study Result   CLINICAL DATA:  71 year old female with history of right breast cancer post lumpectomy 2010 followed by radiation therapy. EXAM: 2D DIGITAL DIAGNOSTIC BILATERAL MAMMOGRAM WITH CAD AND ADJUNCT TOMO COMPARISON:  Previous exam(s). ACR Breast Density Category b: There are scattered areas of fibroglandular density. FINDINGS: No suspicious  masses or calcifications are seen in either breast. Postsurgical changes are present in the upper-outer far posterior right breast related to prior lumpectomy. A spot compression magnification MLO view of the lumpectomy site in the right breast was performed. Continued evolution of benign appearing dystrophic calcifications are again seen. There is no mammographic evidence of locally recurrent malignancy. Mammographic images were processed with CAD. IMPRESSION: No mammographic evidence of malignancy in either breast. RECOMMENDATION: Recommend annual routine screening mammography. I have discussed the findings and recommendations with the patient. Results were also provided in writing at the conclusion of the visit. If applicable, a reminder letter will be sent to the patient regarding the next appointment. BI-RADS CATEGORY  2: Benign. Electronically Signed   By: Everlean Alstrom M.D.   On: 03/23/2016 08:47    ASSESSMENT & PLAN: DCIS diagnosed in 2010; Lumpectomy in 2010 followed by radiation.  Endometrial ablation in 2002 Ongoing EVISTA therapy Chronic Joint Pain Hyperlipidemia HTN Prednisone therapy Post menopausal  Clinically NED on breast exam today. Repeat screening bilateral mammogram in July 2019. Continue evista.  She will return for follow up in 1 year.      All questions were answered. The patient knows to call the clinic with any problems, questions or concerns.  This note was electronically signed.    Twana First, MD  05/19/2017 12:20 PM

## 2017-05-26 DIAGNOSIS — Z23 Encounter for immunization: Secondary | ICD-10-CM | POA: Diagnosis not present

## 2017-05-31 DIAGNOSIS — R51 Headache: Secondary | ICD-10-CM | POA: Diagnosis not present

## 2017-05-31 DIAGNOSIS — Z6828 Body mass index (BMI) 28.0-28.9, adult: Secondary | ICD-10-CM | POA: Diagnosis not present

## 2017-06-06 DIAGNOSIS — Z6828 Body mass index (BMI) 28.0-28.9, adult: Secondary | ICD-10-CM | POA: Diagnosis not present

## 2017-06-06 DIAGNOSIS — J309 Allergic rhinitis, unspecified: Secondary | ICD-10-CM | POA: Diagnosis not present

## 2017-06-06 DIAGNOSIS — W5589XA Other contact with other mammals, initial encounter: Secondary | ICD-10-CM | POA: Diagnosis not present

## 2017-06-13 DIAGNOSIS — W5582XA Struck by other mammals, initial encounter: Secondary | ICD-10-CM | POA: Diagnosis not present

## 2017-06-13 DIAGNOSIS — R51 Headache: Secondary | ICD-10-CM | POA: Diagnosis not present

## 2017-06-13 DIAGNOSIS — Z6828 Body mass index (BMI) 28.0-28.9, adult: Secondary | ICD-10-CM | POA: Diagnosis not present

## 2017-06-13 DIAGNOSIS — L603 Nail dystrophy: Secondary | ICD-10-CM | POA: Diagnosis not present

## 2017-06-13 DIAGNOSIS — B880 Other acariasis: Secondary | ICD-10-CM | POA: Diagnosis not present

## 2017-06-23 DIAGNOSIS — I10 Essential (primary) hypertension: Secondary | ICD-10-CM | POA: Diagnosis not present

## 2017-06-23 DIAGNOSIS — Z1159 Encounter for screening for other viral diseases: Secondary | ICD-10-CM | POA: Diagnosis not present

## 2017-06-23 DIAGNOSIS — E039 Hypothyroidism, unspecified: Secondary | ICD-10-CM | POA: Diagnosis not present

## 2017-06-23 DIAGNOSIS — E559 Vitamin D deficiency, unspecified: Secondary | ICD-10-CM | POA: Diagnosis not present

## 2017-06-23 DIAGNOSIS — R7301 Impaired fasting glucose: Secondary | ICD-10-CM | POA: Diagnosis not present

## 2017-06-27 DIAGNOSIS — I10 Essential (primary) hypertension: Secondary | ICD-10-CM | POA: Diagnosis not present

## 2017-06-27 DIAGNOSIS — Z853 Personal history of malignant neoplasm of breast: Secondary | ICD-10-CM | POA: Diagnosis not present

## 2017-06-27 DIAGNOSIS — K219 Gastro-esophageal reflux disease without esophagitis: Secondary | ICD-10-CM | POA: Diagnosis not present

## 2017-06-27 DIAGNOSIS — M1711 Unilateral primary osteoarthritis, right knee: Secondary | ICD-10-CM | POA: Diagnosis not present

## 2017-06-27 DIAGNOSIS — M353 Polymyalgia rheumatica: Secondary | ICD-10-CM | POA: Diagnosis not present

## 2017-07-26 DIAGNOSIS — R220 Localized swelling, mass and lump, head: Secondary | ICD-10-CM | POA: Diagnosis not present

## 2017-07-26 DIAGNOSIS — M5030 Other cervical disc degeneration, unspecified cervical region: Secondary | ICD-10-CM | POA: Diagnosis not present

## 2017-07-26 DIAGNOSIS — L4059 Other psoriatic arthropathy: Secondary | ICD-10-CM | POA: Diagnosis not present

## 2017-07-26 DIAGNOSIS — R21 Rash and other nonspecific skin eruption: Secondary | ICD-10-CM | POA: Diagnosis not present

## 2017-07-26 DIAGNOSIS — M25561 Pain in right knee: Secondary | ICD-10-CM | POA: Diagnosis not present

## 2017-07-26 DIAGNOSIS — E663 Overweight: Secondary | ICD-10-CM | POA: Diagnosis not present

## 2017-07-26 DIAGNOSIS — Z7952 Long term (current) use of systemic steroids: Secondary | ICD-10-CM | POA: Diagnosis not present

## 2017-07-26 DIAGNOSIS — Z6828 Body mass index (BMI) 28.0-28.9, adult: Secondary | ICD-10-CM | POA: Diagnosis not present

## 2017-07-26 DIAGNOSIS — M25551 Pain in right hip: Secondary | ICD-10-CM | POA: Diagnosis not present

## 2017-08-02 DIAGNOSIS — I739 Peripheral vascular disease, unspecified: Secondary | ICD-10-CM | POA: Diagnosis not present

## 2017-08-02 DIAGNOSIS — Z961 Presence of intraocular lens: Secondary | ICD-10-CM | POA: Diagnosis not present

## 2017-08-02 DIAGNOSIS — H40013 Open angle with borderline findings, low risk, bilateral: Secondary | ICD-10-CM | POA: Diagnosis not present

## 2017-08-04 ENCOUNTER — Encounter: Payer: Self-pay | Admitting: Cardiology

## 2017-08-04 ENCOUNTER — Ambulatory Visit: Payer: Medicare HMO | Admitting: Cardiology

## 2017-08-04 VITALS — BP 130/70 | HR 83 | Ht 65.0 in | Wt 173.0 lb

## 2017-08-04 DIAGNOSIS — R9430 Abnormal result of cardiovascular function study, unspecified: Secondary | ICD-10-CM | POA: Diagnosis not present

## 2017-08-04 DIAGNOSIS — I10 Essential (primary) hypertension: Secondary | ICD-10-CM

## 2017-08-04 DIAGNOSIS — E782 Mixed hyperlipidemia: Secondary | ICD-10-CM | POA: Diagnosis not present

## 2017-08-04 DIAGNOSIS — I6523 Occlusion and stenosis of bilateral carotid arteries: Secondary | ICD-10-CM

## 2017-08-04 NOTE — Patient Instructions (Signed)
Your physician wants you to follow-up in: 1 year with Dr.McDowell You will receive a reminder letter in the mail two months in advance. If you don't receive a letter, please call our office to schedule the follow-up appointment.    Your physician recommends that you continue on your current medications as directed. Please refer to the Current Medication list given to you today.    If you need a refill on your cardiac medications before your next appointment, please call your pharmacy.     No lab work or tests ordered today.      Thank you for choosing [facility] !        

## 2017-08-04 NOTE — Progress Notes (Signed)
Cardiology Office Note  Date: 08/04/2017   ID: Maria Ritter, DOB 1946/02/28, MRN [id number]  PCP: Celene Squibb, MD  Primary Cardiologist: Rozann Lesches, MD   Chief Complaint  Patient presents with  . Cardiac follow-up    History of Present Illness: Maria Ritter is a 71 y.o. female last seen in November 2017.  She presents for a routine follow-up visit.  She does not describe any substantial change in stamina since last encounter.  She is exercising twice a week in the maintenance program with cardiac rehabilitation.  She does not report any angina symptoms.  Sometimes feels more short of breath if she walks up an incline, but states that this has not increased in intensity.  She reports no palpitations or syncope.  I reviewed her recent follow-up lab work from Dr. Nevada Crane in October.  She states that she is taking her medications as prescribed.  From a cardiac perspective she continues on aspirin, Norvasc, and Lisinopril.  Blood pressure control is adequate today.  She declined having a follow-up ECG today.  Past Medical History:  Diagnosis Date  . Breast cancer ([place]) 2010   Right Lumpectomy and XRT  . Carotid artery disease (HCC)    Mild to moderate bilateral ICA disease, left greater than right 07/2015  . Essential hypertension   . GERD (gastroesophageal reflux disease)   . Glaucoma   . Hyperlipidemia   . Hypothyroidism   . Osteoarthritis   . Polymyalgia rheumatica (HCC)     Past Surgical History:  Procedure Laterality Date  . BASAL CELL CARCINOMA EXCISION  2010   Right breast  . BREAST LUMPECTOMY Right 2010  . ENDOMETRIAL ABLATION  2002  . FINGER SURGERY     Minor surgical intervention following a crush injury to the left fourth finger    Current Outpatient Medications  Medication Sig Dispense Refill  . ALCLOMETASONE DIPROPIONATE EX Apply topically. Apply to eye lids for itching    . amLODipine (NORVASC) 5 MG tablet Take 5 mg by mouth daily.      Marland Kitchen  aspirin 81 MG tablet Take 81 mg by mouth daily.      . Biotin 1000 MCG tablet Take 1,000 mcg by mouth daily.    Marland Kitchen CALCIUM CITRATE-VITAMIN D PO Take 1 tablet by mouth daily. 630 mg Calcium 500 mg Vitamin D    . Cholecalciferol (VITAMIN D3) 2000 units TABS Take by mouth 2 (two) times daily.     . diphenhydrAMINE (BENADRYL) 25 MG tablet Take 25 mg by mouth every 6 (six) hours as needed.    . fexofenadine (ALLEGRA) 180 MG tablet Take 180 mg by mouth daily as needed for allergies or rhinitis.    . Flaxseed, Linseed, (FLAXSEED OIL) 1000 MG CAPS Take by mouth daily.     Marland Kitchen GLUCOSAMINE-CHONDROITIN PO Take 1 tablet by mouth 2 (two) times daily. Glucosamine 1500 mg Chondroitin 1200 mg    . hydrocortisone cream 1 % Apply 1 application topically as needed for itching.    Marland Kitchen ibuprofen (ADVIL,MOTRIN) 200 MG tablet Take 200 mg by mouth every 6 (six) hours as needed.    Marland Kitchen Ketotifen Fumarate (ALAWAY OP) Apply 1 drop to eye 2 (two) times daily as needed. Alaway 0.035% eye drops    . levothyroxine (SYNTHROID, LEVOTHROID) 125 MCG tablet Take 125 mcg by mouth daily before breakfast.    . lisinopril (PRINIVIL,ZESTRIL) 10 MG tablet Take 10 mg by mouth daily.    . metroNIDAZOLE (METROGEL) 0.75 %  gel Apply 1 application topically 2 (two) times daily. Apply to cheeks as needed for redness    . Misc Natural Products (TART CHERRY ADVANCED) CAPS Take 1 capsule by mouth daily. 1200 mg    . naproxen sodium (ANAPROX) 220 MG tablet Take 220 mg by mouth as needed.    Vladimir Faster Glycol-Propyl Glycol (SYSTANE OP) Apply 1 drop to eye 2 (two) times daily as needed. Systane Complete eye drops    . Pramoxine-Benzyl Alcohol (ITCH-X EX) Apply topically as needed. ITCH-X    . raloxifene (EVISTA) 60 MG tablet Take 60 mg by mouth daily.      . sodium chloride (OCEAN) 0.65 % SOLN nasal spray Place 1 spray into both nostrils as needed for congestion.    . triamcinolone cream (KENALOG) 0.1 % Apply 1 application topically 2 (two) times daily.  Apply under chin for itching    . Turmeric 500 MG CAPS Take 500 mg by mouth daily.    . vitamin B-12 (CYANOCOBALAMIN) 500 MCG tablet Take 1,000 mcg by mouth daily.      No current facility-administered medications for this visit.    Allergies:  Mango flavor; Erythromycin; Ivp dye [iodinated diagnostic agents]; Penicillins; and Sulfonamide derivatives   Social History: The patient  reports that  has never smoked. she has never used smokeless tobacco. She reports that she does not drink alcohol or use drugs.   ROS:  Please see the history of present illness. Otherwise, complete review of systems is positive for occasional skin itching.  Reports improvement in chronic knee pain, no longer on steroids.  All other systems are reviewed and negative.   Physical Exam: VS:  BP 130/70 (BP Location: Left Arm)   Pulse 83   Ht 5\' 5"  (1.651 m)   Wt 173 lb (78.5 kg)   SpO2 94%   BMI 28.79 kg/m , BMI Body mass index is 28.79 kg/m.  Wt Readings from Last 3 Encounters:  08/04/17 173 lb (78.5 kg)  05/19/17 174 lb 12.8 oz (79.3 kg)  07/26/16 175 lb (79.4 kg)    General: Patient appears comfortable at rest. HEENT: Conjunctiva and lids normal, oropharynx clear. Neck: Supple, no elevated JVP or carotid bruits, no thyromegaly. Lungs: Clear to auscultation, nonlabored breathing at rest. Cardiac: Regular rate and rhythm, no S3 or significant systolic murmur, no pericardial rub. Abdomen: Soft, nontender, bowel sounds present, no guarding or rebound. Extremities: No pitting edema, distal pulses 2+. Skin: Warm and dry. Musculoskeletal: No kyphosis. Neuropsychiatric: Alert and oriented x3, affect grossly appropriate.  ECG: I personally reviewed the tracing from 10/14/2015 which showed normal sinus rhythm.  Recent Labwork:  April 2017: Potassium 4.4, BUN 16, creatinine 0.76, AST 20, ALT 22, hemoglobin 13.3, platelets 329, cholesterol 150, triglycerides 126, HDL 48, LDL 77, TSH 1.01 June 2017:  Hemoglobin 13.1, platelets 269, BUN 18, creatinine 0.73, potassium 4.4, AST 44, ALT 37, cholesterol 179, triglycerides 163, HDL 48, LDL 98, hemoglobin A1c 6.0  Other Studies Reviewed Today:  Exercise Cardiolite 10/23/2015:  Horizontal ST segment depression ST segment depression was noted during stress in the II leads.  Defect 1: There is a medium defect of mild severity present in the mid inferolateral location. It is non-reversible and while it is most likely due to soft tissue attenuation artifact, scar cannot entirely be excluded given the corresponding ECG abnormalities.  This is a low risk study.  Nuclear stress EF: 63%.  Assessment and Plan:  1.  History of abnormal Cardiolite study as detailed  above with intermittent dyspnea on exertion, no active angina symptoms.  She prefers medical therapy and observation.  We have discussed cardiac catheterization if symptoms escalate.  She will continue with regular exercise plan.  She declined follow-up ECG today.  2.  Essential hypertension, blood pressure adequately controlled today on Norvasc and lisinopril.  Keep follow-up with Dr. Nevada Crane.  3.  History of hyperlipidemia.  Recent LDL 98.  She prefers not to take statin medications.  4.  Asymptomatic carotid artery disease, nonobstructive based on carotid Dopplers in 2016.  Current medicines were reviewed with the patient today.  Disposition: Follow-up in 1 year, sooner if needed.  Signed, Satira Sark, MD, [facility] 08/04/2017 11:13 AM    [place] [facility] [address] [place], [place] [address] Phone: [phone]; Fax: [phone]

## 2017-08-24 ENCOUNTER — Ambulatory Visit: Payer: Medicare Other | Admitting: Allergy & Immunology

## 2017-09-07 DIAGNOSIS — R69 Illness, unspecified: Secondary | ICD-10-CM | POA: Diagnosis not present

## 2017-09-13 ENCOUNTER — Ambulatory Visit: Payer: Medicare Other | Admitting: Allergy & Immunology

## 2017-10-25 DIAGNOSIS — I739 Peripheral vascular disease, unspecified: Secondary | ICD-10-CM | POA: Diagnosis not present

## 2017-11-01 ENCOUNTER — Ambulatory Visit: Payer: Medicare HMO | Admitting: Allergy & Immunology

## 2017-11-01 ENCOUNTER — Encounter: Payer: Self-pay | Admitting: Allergy & Immunology

## 2017-11-01 VITALS — BP 124/68 | HR 73 | Temp 97.8°F | Resp 16 | Ht 64.96 in | Wt 177.2 lb

## 2017-11-01 DIAGNOSIS — J302 Other seasonal allergic rhinitis: Secondary | ICD-10-CM

## 2017-11-01 DIAGNOSIS — L299 Pruritus, unspecified: Secondary | ICD-10-CM | POA: Diagnosis not present

## 2017-11-01 DIAGNOSIS — J3089 Other allergic rhinitis: Secondary | ICD-10-CM

## 2017-11-01 MED ORDER — HYDROXYZINE HCL 25 MG PO TABS
25.0000 mg | ORAL_TABLET | Freq: Every evening | ORAL | 5 refills | Status: DC
Start: 2017-11-01 — End: 2018-08-04

## 2017-11-01 MED ORDER — FEXOFENADINE HCL 180 MG PO TABS: 180.0000 mg | ORAL_TABLET | Freq: Every day | ORAL | 5 refills | Status: DC | PRN

## 2017-11-01 NOTE — Progress Notes (Signed)
NEW PATIENT  Date of Service/Encounter:  11/01/17   Referring provider: Celene Squibb, MD   Assessment:   Itching - unknown etiology  Perennial and seasonal allergic (rhinitis grasses, indoor molds and dust mites)  Plan/Recommendations:   1. Itching - Testing today showed: grasses, indoor molds and dust mites  - We will confirm this with an environmental allergy panel (blood work). - This blood work can be drawn when your PCP draws your blood next month.  - Testing to the most common foods was negative (peanut, tree nuts, soy, fish mix, shellfish mix, wheat, milk, egg, sesame)  - Avoidance measures provided. - Be sure to use moisturizers twice daily (see examples below).  - You can also try All Free and Clear laundry detergent.  - Your history does not have any "red flags" such as fevers, joint pains, or permanent skin changes that would be concerning for a more serious cause of itching.  - We will get some labs to rule out serious causes of itching: tryptase level to rule out mast cell disease - In the meantime, start suppressive dosing of antihistamines:   - Morning: Allegra (fexofenadine) 360mg  (two tablets)  - Evening: Hydroxyzine 25mg  (one tablet)   2. Return in about 3 months (around 02/01/2018).  Subjective:   Maria Ritter is a 72 y.o. female presenting today for evaluation of  Chief Complaint  Patient presents with  . Pruritus    Maria Ritter has a history of the following: Patient Active Problem List   Diagnosis Date Noted  . Itching 11/01/2017  . Perennial allergic rhinitis 11/01/2017  . History of breast cancer in female 04/21/2016  . Routine cervical smear 04/21/2016  . Vaginal atrophy 04/21/2016  . OVERWEIGHT 01/28/2010  . LEG CRAMPS, NOCTURNAL 01/28/2010  . ELECTROCARDIOGRAM, ABNORMAL 01/28/2010  . CARCINOMA IN SITU, RIGHT BREAST 11/27/2009  . HYPERLIPIDEMIA 11/27/2009  . ARM PAIN, LEFT 11/27/2009  . HYPOTHYROIDISM 11/26/2009  . Essential  hypertension 11/26/2009    History obtained from: chart review and patient.  Maria Ritter was referred by Celene Squibb, MD.     Maria Ritter is a 71 y.o. female presenting for itching. This has been ongoing for around one year. It is all over her body, but it is mostly on her back. She does endorse some itching on her lower legs as well. Symptoms persist during the day and night; it seems to bother her more at night. She recently was on city water but changed to well water around three years ago. [place] She has not noticed any particular trigger, but she does eat a lot of mixed nuts and thinks that this might be related. She is otherwise unsure of other foods. Her back really itches at night, and she does respond well to Benadryl. This helps her to go to sleep. She did have psoriasis in the distant past and she was prescribed Allegra back when it was not over the counter. The psoriasis will still flare occasionally. She does use Cerve occasionally.   Prior to three years ago, she actually did fairly well. She did live in [place] in 2008 but moved away. She was evaluated by Dr. Ishmael Holter at that time. She is unsure whether she was positive to anything at that time.  She did have a DCIS that was treated with surgery and radiation in 2010. She has no kidney and liver issues. She has repeat testing scheduled for next month. She  did have borderline elevated liver function testing around six months ago. She has no kidney issues at all. CBC with normal, as was the remainder of the CMP. Absolute eosinophil count was 200. TFTs were normal and HCV was negative. Vitamin D was normal.   She does endorse itchy watery eyes during the season. But this does not bother her too often. She uses saline nasal spray as needed.   Otherwise, there is no history of other atopic diseases, including asthma, drug allergies, stinging insect allergies, or urticaria. There is no significant infectious  history. Vaccinations are up to date.    Past Medical History: Patient Active Problem List   Diagnosis Date Noted  . Itching 11/01/2017  . Perennial allergic rhinitis 11/01/2017  . History of breast cancer in female 04/21/2016  . Routine cervical smear 04/21/2016  . Vaginal atrophy 04/21/2016  . OVERWEIGHT 01/28/2010  . LEG CRAMPS, NOCTURNAL 01/28/2010  . ELECTROCARDIOGRAM, ABNORMAL 01/28/2010  . CARCINOMA IN SITU, RIGHT BREAST 11/27/2009  . HYPERLIPIDEMIA 11/27/2009  . ARM PAIN, LEFT 11/27/2009  . HYPOTHYROIDISM 11/26/2009  . Essential hypertension 11/26/2009    Medication List:  Allergies as of 11/01/2017      Reactions   Mango Flavor Hives   Erythromycin    Ivp Dye [iodinated Diagnostic Agents]    Penicillins    Sulfonamide Derivatives       Medication List        Accurate as of 11/01/17 11:30 AM. Always use your most recent med list.          ALAWAY OP Apply 1 drop to eye 2 (two) times daily as needed. Alaway 0.035% eye drops   amLODipine 5 MG tablet Commonly known as:  NORVASC Take 5 mg by mouth daily.   aspirin 81 MG tablet Take 81 mg by mouth daily.   CALCIUM CITRATE-VITAMIN D PO Take 1 tablet by mouth daily. 630 mg Calcium 500 mg Vitamin D   diphenhydrAMINE 25 MG tablet Commonly known as:  BENADRYL Take 25 mg by mouth every 6 (six) hours as needed.   fexofenadine 180 MG tablet Commonly known as:  ALLEGRA Take 1 tablet (180 mg total) by mouth daily as needed for allergies or rhinitis.   Flaxseed Oil 1000 MG Caps Take by mouth daily.   GLUCOSAMINE-CHONDROITIN PO Take 1 tablet by mouth 2 (two) times daily. Glucosamine 1500 mg Chondroitin 1200 mg   hydrocortisone cream 1 % Apply 1 application topically as needed for itching.   hydrOXYzine 25 MG tablet Commonly known as:  ATARAX/VISTARIL Take 1 tablet (25 mg total) by mouth every evening.   ibuprofen 200 MG tablet Commonly known as:  ADVIL,MOTRIN Take 200 mg by mouth every 6 (six) hours as  needed.   ITCH-X EX Apply topically as needed. ITCH-X   levothyroxine 125 MCG tablet Commonly known as:  SYNTHROID, LEVOTHROID Take 125 mcg by mouth daily before breakfast.   lisinopril 10 MG tablet Commonly known as:  PRINIVIL,ZESTRIL Take 10 mg by mouth daily.   metroNIDAZOLE 0.75 % gel Commonly known as:  METROGEL Apply 1 application topically 2 (two) times daily. Apply to cheeks as needed for redness   raloxifene 60 MG tablet Commonly known as:  EVISTA Take 60 mg by mouth daily.   sodium chloride 0.65 % Soln nasal spray Commonly known as:  OCEAN Place 1 spray into both nostrils as needed for congestion.   SYSTANE OP Apply 1 drop to eye 2 (two) times daily as needed. Systane Complete eye drops  TART CHERRY ADVANCED Caps Take 1 capsule by mouth daily. 1200 mg   triamcinolone cream 0.1 % Commonly known as:  KENALOG Apply 1 application topically 2 (two) times daily. Apply under chin for itching   Turmeric 500 MG Caps Take 500 mg by mouth daily.   vitamin B-12 500 MCG tablet Commonly known as:  CYANOCOBALAMIN Take 1,000 mcg by mouth daily.   Vitamin D3 2000 units Tabs Take by mouth 2 (two) times daily.       Birth History: non-contributory.   Developmental History: non-contributory.   Past Surgical History: Past Surgical History:  Procedure Laterality Date  . BASAL CELL CARCINOMA EXCISION  2010   Right breast  . BREAST LUMPECTOMY Right 2010  . ENDOMETRIAL ABLATION  2002  . FINGER SURGERY     Minor surgical intervention following a crush injury to the left fourth finger     Family History: Family History  Problem Relation Age of Onset  . Pneumonia Mother   . Parkinsonism Mother   . Heart failure Father      Social History: Maria Ritter lives at home with her animals. She otherwise lives by herself. She is a retired Web designer. She currently lives in a [place] home. There is laminate in the main living areas and carpeting in the bedrooms.  They have electric heating and central cooling. There is one dog, one cat, one rabbit, and four turtles in her home. At one point, she did have 42 animals but is now down to her current seven animals. She does not have dust mite coverings on the bedding. There is no tobacco exposure.     Review of Systems: a 14-point review of systems is pertinent for what is mentioned in HPI.  Otherwise, all other systems were negative. Constitutional: negative other than that listed in the HPI Eyes: negative other than that listed in the HPI Ears, nose, mouth, throat, and face: negative other than that listed in the HPI Respiratory: negative other than that listed in the HPI Cardiovascular: negative other than that listed in the HPI Gastrointestinal: negative other than that listed in the HPI Genitourinary: negative other than that listed in the HPI Integument: negative other than that listed in the HPI Hematologic: negative other than that listed in the HPI Musculoskeletal: negative other than that listed in the HPI Neurological: negative other than that listed in the HPI Allergy/Immunologic: negative other than that listed in the HPI    Objective:   Blood pressure 124/68, pulse 73, temperature 97.8 F (36.6 C), resp. rate 16, height 5' 4.96" (1.65 m), weight 177 lb 3.2 oz (80.4 kg), SpO2 96 %. Body mass index is 29.52 kg/m.   Physical Exam:  General: Alert, interactive, in no acute distress. Talkative. Pleasant. Asks lots of questions.  Eyes: No conjunctival injection bilaterally, no discharge on the right, no discharge on the left and no Horner-Trantas dots present. PERRL bilaterally. EOMI without pain. No photophobia.  Ears: Right TM pearly gray with normal light reflex, Left TM pearly gray with normal light reflex, Right TM intact without perforation and Left TM intact without perforation.  Nose/Throat: External nose within normal limits and septum midline. Turbinates edematous and pale  without discharge. Posterior oropharynx mildly erythematous without cobblestoning in the posterior oropharynx. Tonsils 2+ without exudates.  Tongue without thrush. Neck: Supple without thyromegaly. Trachea midline. Adenopathy: shoddy bilateral anterior cervical lymphadenopathy Lungs: Clear to auscultation without wheezing, rhonchi or rales. No increased work of breathing. CV: Normal S1/S2. No murmurs. Capillary  refill <2 seconds.  Abdomen: Nondistended, nontender. No guarding or rebound tenderness. Bowel sounds present in all fields and hypoactive  Skin: Dry, erythematous, excoriated patches on the bilateral ankles as well as her neck and upper back. Overall her skin is quite scaly and dry with excoriations over much of her body. Extremities:  No clubbing, cyanosis or edema. Neuro:   Grossly intact. No focal deficits appreciated. Responsive to questions.  Diagnostic studies:   Allergy Studies:   Indoor/Outdoor Percutaneous Adult Environmental Panel: positive to bahia grass, Fusarium, Phoma and Df mite. Otherwise negative with adequate controls.  Selected Food Panel: negative to Peanut, Soy, Wheat, Sesame, Milk, Egg, Casein, Shellfish Mix , Fish Mix, Cashew, [place], [place], [place], [place]berry, [place] nut, Coconut and Pistachio   Allergy testing results were read and interpreted by myself, documented by clinical staff.     Salvatore Marvel, MD Allergy and [facility]

## 2017-11-01 NOTE — Patient Instructions (Addendum)
1. Itching - Testing today showed: grasses, indoor molds and dust mites  - We will confirm this with an environmental allergy panel (blood work). - This blood work can be drawn when your PCP draws your blood next month.   - Avoidance measures provided. - Be sure to use moisturizers twice daily (see examples below).  - You can also try All Free and Clear laundry detergent.  - Your history does not have any "red flags" such as fevers, joint pains, or permanent skin changes that would be concerning for a more serious cause of itching.  - We will get some labs to rule out serious causes of itching: tryptase level to rule out mast cell disease - In the meantime, start suppressive dosing of antihistamines:   - Morning: Allegra (fexofenadine) 360mg  (two tablets)  - Evening: Hydroxyzine 25mg  (one tablet)   2. Return in about 3 months (around 02/01/2018).   Please inform us of any Emergency Department visits, hospitalizations, or changes in symptoms. Call us before going to the ED for breathing or allergy symptoms since we might be able to fit you in for a sick visit. Feel free to contact us anytime with any questions, problems, or concerns.  It was a pleasure to meet you today!  Websites that have reliable patient information: 1. American Academy of Asthma, Allergy, and Immunology: www.aaaai.org 2. Food Allergy Research and Education (FARE): foodallergy.org 3. Mothers of Asthmatics: http://www.asthmacommunitynetwork.org 4. American College of Allergy, Asthma, and Immunology: www.acaai.org   Reducing Pollen Exposure  The American Academy of Allergy, Asthma and Immunology suggests the following steps to reduce your exposure to pollen during allergy seasons.    1. Do not hang sheets or clothing out to dry; pollen may collect on these items. 2. Do not mow lawns or spend time around freshly cut grass; mowing stirs up pollen. 3. Keep windows closed at night.  Keep car windows closed while  driving. 4. Minimize morning activities outdoors, a time when pollen counts are usually at their highest. 5. Stay indoors as much as possible when pollen counts or humidity is high and on windy days when pollen tends to remain in the air longer. 6. Use air conditioning when possible.  Many air conditioners have filters that trap the pollen spores. 7. Use a HEPA room air filter to remove pollen form the indoor air you breathe.  Control of Mold Allergen   Mold and fungi can grow on a variety of surfaces provided certain temperature and moisture conditions exist.  Outdoor molds grow on plants, decaying vegetation and soil.  The major outdoor mold, Alternaria and Cladosporium, are found in very high numbers during hot and dry conditions.  Generally, a late Summer - Fall peak is seen for common outdoor fungal spores.  Rain will temporarily lower outdoor mold spore count, but counts rise rapidly when the rainy period ends.  The most important indoor molds are Aspergillus and Penicillium.  Dark, humid and poorly ventilated basements are ideal sites for mold growth.  The next most common sites of mold growth are the bathroom and the kitchen.   Indoor (Perennial) Mold Control   Positive indoor molds via skin testing: Fusarium and Phoma  1. Maintain humidity below 50%. 2. Clean washable surfaces with 5% bleach solution. 3. Remove sources e.g. contaminated carpets.     Control of House Dust Mite Allergen    House dust mites play a major role in allergic asthma and rhinitis.  They occur in environments with high humidity  wherever human skin, the food for dust mites is found. High levels have been detected in dust obtained from mattresses, pillows, carpets, upholstered furniture, bed covers, clothes and soft toys.  The principal allergen of the house dust mite is found in its feces.  A gram of dust may contain 1,000 mites and 250,000 fecal particles.  Mite antigen is easily measured in the air during  house cleaning activities.    1. Encase mattresses, including the box spring, and pillow, in an air tight cover.  Seal the zipper end of the encased mattresses with wide adhesive tape. 2. Wash the bedding in water of 130 degrees Farenheit weekly.  Avoid cotton comforters/quilts and flannel bedding: the most ideal bed covering is the dacron comforter. 3. Remove all upholstered furniture from the bedroom. 4. Remove carpets, carpet padding, rugs, and non-washable window drapes from the bedroom.  Wash drapes weekly or use plastic window coverings. 5. Remove all non-washable stuffed toys from the bedroom.  Wash stuffed toys weekly. 6. Have the room cleaned frequently with a vacuum cleaner and a damp dust-mop.  The patient should not be in a room which is being cleaned and should wait 1 hour after cleaning before going into the room. 7. Close and seal all heating outlets in the bedroom.  Otherwise, the room will become filled with dust-laden air.  An electric heater can be used to heat the room. 8. Reduce indoor humidity to less than 50%.  Do not use a humidifier.   SKIN CARE REGIMEN:  Bathed and soak for 10 minutes in warm water once today. Pat dry.  Immediately apply the below creams:  To healthy skin apply Aquaphor, Eucerin, Vanicream, Cerave, or Vaseline jelly twice a day.

## 2017-12-22 DIAGNOSIS — K219 Gastro-esophageal reflux disease without esophagitis: Secondary | ICD-10-CM | POA: Diagnosis not present

## 2017-12-22 DIAGNOSIS — I10 Essential (primary) hypertension: Secondary | ICD-10-CM | POA: Diagnosis not present

## 2017-12-22 DIAGNOSIS — L299 Pruritus, unspecified: Secondary | ICD-10-CM | POA: Diagnosis not present

## 2017-12-22 DIAGNOSIS — Z853 Personal history of malignant neoplasm of breast: Secondary | ICD-10-CM | POA: Diagnosis not present

## 2017-12-22 DIAGNOSIS — M353 Polymyalgia rheumatica: Secondary | ICD-10-CM | POA: Diagnosis not present

## 2017-12-22 DIAGNOSIS — M1711 Unilateral primary osteoarthritis, right knee: Secondary | ICD-10-CM | POA: Diagnosis not present

## 2017-12-28 LAB — IGE+ALLERGENS ZONE 2(30)
Aspergillus Fumigatus IgE: <0.1 kU/L
Bahia Grass IgE: <0.1 kU/L
Cladosporium Herbarum IgE: <0.1 kU/L
Cockroach, American IgE: <0.1 kU/L
Common Silver Birch IgE: <0.1 kU/L
D Pteronyssinus IgE: <0.1 kU/L
Elm, American IgE: <0.1 kU/L
Hickory, White IgE: <0.1 kU/L
IgE (Immunoglobulin E), Serum: 454 IU/mL (ref 6–495)
Johnson Grass IgE: <0.1 kU/L
Maple/Box Elder IgE: <0.1 kU/L
Mugwort IgE Qn: <0.1 kU/L
Nettle IgE: <0.1 kU/L
Penicillium Chrysogen IgE: <0.1 kU/L
Pigweed, Rough IgE: <0.1 kU/L
Ragweed, Short IgE: <0.1 kU/L
Sheep Sorrel IgE Qn: <0.1 kU/L
Stemphylium Herbarum IgE: <0.1 kU/L
Timothy Grass IgE: <0.1 kU/L
White Mulberry IgE: <0.1 kU/L

## 2017-12-28 LAB — TRYPTASE: Tryptase: 7.9 ug/L (ref 2.2–13.2)

## 2017-12-30 DIAGNOSIS — M1712 Unilateral primary osteoarthritis, left knee: Secondary | ICD-10-CM | POA: Diagnosis not present

## 2017-12-30 DIAGNOSIS — K219 Gastro-esophageal reflux disease without esophagitis: Secondary | ICD-10-CM | POA: Diagnosis not present

## 2017-12-30 DIAGNOSIS — Z853 Personal history of malignant neoplasm of breast: Secondary | ICD-10-CM | POA: Diagnosis not present

## 2017-12-30 DIAGNOSIS — I2510 Atherosclerotic heart disease of native coronary artery without angina pectoris: Secondary | ICD-10-CM | POA: Diagnosis not present

## 2017-12-30 DIAGNOSIS — I10 Essential (primary) hypertension: Secondary | ICD-10-CM | POA: Diagnosis not present

## 2017-12-30 DIAGNOSIS — E782 Mixed hyperlipidemia: Secondary | ICD-10-CM | POA: Diagnosis not present

## 2017-12-30 DIAGNOSIS — R945 Abnormal results of liver function studies: Secondary | ICD-10-CM | POA: Diagnosis not present

## 2017-12-30 DIAGNOSIS — M353 Polymyalgia rheumatica: Secondary | ICD-10-CM | POA: Diagnosis not present

## 2017-12-30 DIAGNOSIS — E039 Hypothyroidism, unspecified: Secondary | ICD-10-CM | POA: Diagnosis not present

## 2017-12-30 DIAGNOSIS — J309 Allergic rhinitis, unspecified: Secondary | ICD-10-CM | POA: Diagnosis not present

## 2018-01-18 DIAGNOSIS — Z6829 Body mass index (BMI) 29.0-29.9, adult: Secondary | ICD-10-CM | POA: Diagnosis not present

## 2018-01-18 DIAGNOSIS — R945 Abnormal results of liver function studies: Secondary | ICD-10-CM | POA: Diagnosis not present

## 2018-01-18 DIAGNOSIS — M25561 Pain in right knee: Secondary | ICD-10-CM | POA: Diagnosis not present

## 2018-01-18 DIAGNOSIS — L4059 Other psoriatic arthropathy: Secondary | ICD-10-CM | POA: Diagnosis not present

## 2018-01-18 DIAGNOSIS — M5030 Other cervical disc degeneration, unspecified cervical region: Secondary | ICD-10-CM | POA: Diagnosis not present

## 2018-01-18 DIAGNOSIS — R21 Rash and other nonspecific skin eruption: Secondary | ICD-10-CM | POA: Diagnosis not present

## 2018-01-18 DIAGNOSIS — R220 Localized swelling, mass and lump, head: Secondary | ICD-10-CM | POA: Diagnosis not present

## 2018-01-18 DIAGNOSIS — M25551 Pain in right hip: Secondary | ICD-10-CM | POA: Diagnosis not present

## 2018-01-18 DIAGNOSIS — E663 Overweight: Secondary | ICD-10-CM | POA: Diagnosis not present

## 2018-01-18 DIAGNOSIS — Z7952 Long term (current) use of systemic steroids: Secondary | ICD-10-CM | POA: Diagnosis not present

## 2018-01-24 DIAGNOSIS — I739 Peripheral vascular disease, unspecified: Secondary | ICD-10-CM | POA: Diagnosis not present

## 2018-01-25 DIAGNOSIS — R69 Illness, unspecified: Secondary | ICD-10-CM | POA: Diagnosis not present

## 2018-01-30 ENCOUNTER — Other Ambulatory Visit (HOSPITAL_COMMUNITY): Payer: Self-pay | Admitting: Internal Medicine

## 2018-01-30 DIAGNOSIS — Z1231 Encounter for screening mammogram for malignant neoplasm of breast: Secondary | ICD-10-CM

## 2018-01-31 ENCOUNTER — Encounter: Payer: Self-pay | Admitting: Allergy & Immunology

## 2018-01-31 ENCOUNTER — Ambulatory Visit: Payer: Medicare HMO | Admitting: Allergy & Immunology

## 2018-01-31 VITALS — BP 122/72 | HR 67 | Resp 16

## 2018-01-31 DIAGNOSIS — J302 Other seasonal allergic rhinitis: Secondary | ICD-10-CM | POA: Diagnosis not present

## 2018-01-31 DIAGNOSIS — J3089 Other allergic rhinitis: Secondary | ICD-10-CM | POA: Diagnosis not present

## 2018-01-31 DIAGNOSIS — L2089 Other atopic dermatitis: Secondary | ICD-10-CM | POA: Diagnosis not present

## 2018-01-31 DIAGNOSIS — L299 Pruritus, unspecified: Secondary | ICD-10-CM | POA: Diagnosis not present

## 2018-01-31 MED ORDER — TACROLIMUS 0.1 % EX OINT: TOPICAL_OINTMENT | Freq: Two times a day (BID) | CUTANEOUS | 5 refills | Status: DC

## 2018-01-31 NOTE — Progress Notes (Addendum)
FOLLOW UP  Date of Service/Encounter:  01/31/18   Assessment:   Itching - unknown etiology  Eczema - on triamcinolone ointment BID PRN  Perennial and seasonal allergic (rhinitis grasses, indoor molds and dust mites)  Elevated absolute eosinophil count of 700 (April 2019) - will retest at the next visit   Maria Ritter presents for a follow-up visit.  Her itching seems better controlled with the use of antihistamines.  She also instituted dust mite covers, which have provided fairly good relief of her nighttime symptoms.  She was using Allegra in the morning and hydroxyzine at night, but she reported drowsiness to the hydroxyzine and has since stopped it.  She does have a history of eczema and today she does have flares on her fingers as well as her bilateral lower extremities.  She has triamcinolone which she uses as needed for this.  She was on Protopic in the distant past, and thinks that it worked well.  She has been on a multitude of other topical steroids as well.  She is followed by dermatologist here in [place].  We are sending in a prescription for Protopic to see if this helps once again.  We did briefly discuss the use of Dupixent as a means of controlling her eczema as well as her itching.  While she is open to this, she is worried about any copayments that might be required for this.  Plan/Recommendations:   1. Itching with sensitizations to grasses, indoor molds, dust mites - Labwork printed off.  - It seems that the dust mite coverings have helped.  - Continue with Allegra (fexofenadine) 180-360mg  (one to two tablets) in the morning and hydroxyzine 25mg  (one tablet) at night as needed.  2. Eczema - Continue with moisturizing twice daily as as you are doing. - Continue with triamcinolone ointment twice daily as needed. - Prescription for Protopic sent in to use twice daily as needed. - Consider Dupixent in the future if needed.   3. Return in about 6 months (around  08/02/2018).  Subjective:   Maria Ritter is a 72 y.o. female presenting today for follow up of  Chief Complaint  Patient presents with  . Allergic Rhinitis     Maria Ritter has a history of the following: Patient Active Problem List   Diagnosis Date Noted  . Itching 11/01/2017  . Perennial allergic rhinitis 11/01/2017  . History of breast cancer in female 04/21/2016  . Routine cervical smear 04/21/2016  . Vaginal atrophy 04/21/2016  . OVERWEIGHT 01/28/2010  . LEG CRAMPS, NOCTURNAL 01/28/2010  . ELECTROCARDIOGRAM, ABNORMAL 01/28/2010  . CARCINOMA IN SITU, RIGHT BREAST 11/27/2009  . HYPERLIPIDEMIA 11/27/2009  . ARM PAIN, LEFT 11/27/2009  . HYPOTHYROIDISM 11/26/2009  . Essential hypertension 11/26/2009    History obtained from: chart review and patient.  Maria Ritter's Primary Care Provider is Celene Squibb, MD.     Maria Ritter is a 72 y.o. female presenting for a follow up visit. She was last seen as a New Patient in March 2019. At that time, she had testing that was positive to grasses, indoor molds, and dust mites. We recommended Allegra in the morning and hydroxyzine at night, but she was having some difficulty with side effects in the morning after. Now she is taking it only in the morning. We did get blood work that showed normal tryptase and a negative environmental allergy panel.   Since the last visit, she has done fairly well. Itching overall is controlled with  Allegra in the morning. She did finally get the dust mite coverings for her bedding and pillows which has helped. She is impressed with what a huge change this addition has made.   She does have a diagnosis of eczema. She was on Protopic and Elidel in the past. She does have triamcinolone ointment to use as needed. She has also been on a multitude of topical steroids.    She has been having some problems with foot pain. She has seen a foot doctor here in [place]. She does bring in some labs today that show  an absolute eosinophilic count of 627 (April 2019). She is having some symptoms of eosinophilic infiltration.  Otherwise, there have been no changes to her past medical history, surgical history, family history, or social history.    Review of Systems: a 14-point review of systems is pertinent for what is mentioned in HPI.  Otherwise, all other systems were negative. Constitutional: negative other than that listed in the HPI Eyes: negative other than that listed in the HPI Ears, nose, mouth, throat, and face: negative other than that listed in the HPI Respiratory: negative other than that listed in the HPI Cardiovascular: negative other than that listed in the HPI Gastrointestinal: negative other than that listed in the HPI Genitourinary: negative other than that listed in the HPI Integument: negative other than that listed in the HPI Hematologic: negative other than that listed in the HPI Musculoskeletal: negative other than that listed in the HPI Neurological: negative other than that listed in the HPI Allergy/Immunologic: negative other than that listed in the HPI    Objective:   Blood pressure 122/72, pulse 67, resp. rate 16, SpO2 97 %. There is no height or weight on file to calculate BMI.   Physical Exam:  General: Alert, interactive, in no acute distress. Smiling and pleasant female.  Eyes: No conjunctival injection bilaterally, no discharge on the right, no discharge on the left and no Horner-Trantas dots present. PERRL bilaterally. EOMI without pain. No photophobia.  Ears: Right TM pearly gray with normal light reflex, Left TM pearly gray with normal light reflex, Right TM intact without perforation and Left TM intact without perforation.  Nose/Throat: External nose within normal limits and septum midline. Turbinates edematous with clear discharge. Posterior oropharynx erythematous without cobblestoning in the posterior oropharynx. Tonsils 2+ without exudates.  Tongue  without thrush. Lungs: Clear to auscultation without wheezing, rhonchi or rales. No increased work of breathing. CV: Normal S1/S2. No murmurs. Capillary refill <2 seconds.  Skin: Dry, erythematous, excoriated patches on the bilateral lower extremities as well as lesions on her fingers and wrists. Neuro:   Grossly intact. No focal deficits appreciated. Responsive to questions.  Diagnostic studies: none       Salvatore Marvel, MD  [facility]ergy and [facility]

## 2018-01-31 NOTE — Patient Instructions (Addendum)
1. Itching with sensitizations to grasses, indoor molds, dust mites - Labwork printed off.  - It seems that the dust mite coverings have helped.  - Continue with Allegra (fexofenadine) 180-360mg  (one to two tablets) in the morning and hydroxyzine 25mg  (one tablet) at night as needed.  2. Eczema - Continue with moisturizing twice daily as as you are doing. - Continue with triamcinolone ointment twice daily as needed. - Prescription for Protopic sent in to use twice daily as needed. - Consider Dupixent in the future if needed.   3. Return in about 6 months (around 08/02/2018).   Please inform us of any Emergency Department visits, hospitalizations, or changes in symptoms. Call us before going to the ED for breathing or allergy symptoms since we might be able to fit you in for a sick visit. Feel free to contact us anytime with any questions, problems, or concerns.  It was a pleasure to see you again today!  Websites that have reliable patient information: 1. American Academy of Asthma, Allergy, and Immunology: www.aaaai.org 2. Food Allergy Research and Education (FARE): foodallergy.org 3. Mothers of Asthmatics: http://www.asthmacommunitynetwork.org 4. American College of Allergy, Asthma, and Immunology: MonthlyElectricBill.co.uk   Make sure you are registered to vote!

## 2018-02-06 ENCOUNTER — Telehealth: Payer: Self-pay | Admitting: *Deleted

## 2018-02-06 NOTE — Telephone Encounter (Signed)
Called patient and discussed starting Dupixent and affordability.  Will send her info in mail regarding affordability and she can reach out to me.  She also advised that she had called in regarding Protopic being $180 out of pocket and same was changed to [place] which is $100 out of pocket.  She wanted to know of any other alternatives that may be cheaper? I told her that I was not aware of any since Elidel, Protopic and Eucrisa are tier 4 and one of highest copays but would send MD message

## 2018-02-06 NOTE — Telephone Encounter (Signed)
Wow those are expensive. But yes there are no other alternatives in that class at all. She could do OTC hydrocortisone 1% sparingly on the face. We have lots of Eucrisa samples, so she could stop by the [place] office tomorrow to grab some more before she spends $100 for the prescription.   Salvatore Marvel, MD [facility] [place]

## 2018-02-06 NOTE — Telephone Encounter (Signed)
Spoke to pt and informed her she can use Hydrocortisone 1% and for her to come to [place] tomorrow to pick up samples of Eucrisa.

## 2018-03-01 DIAGNOSIS — I10 Essential (primary) hypertension: Secondary | ICD-10-CM | POA: Diagnosis not present

## 2018-03-01 DIAGNOSIS — Z853 Personal history of malignant neoplasm of breast: Secondary | ICD-10-CM | POA: Diagnosis not present

## 2018-03-01 DIAGNOSIS — M1711 Unilateral primary osteoarthritis, right knee: Secondary | ICD-10-CM | POA: Diagnosis not present

## 2018-03-01 DIAGNOSIS — I2510 Atherosclerotic heart disease of native coronary artery without angina pectoris: Secondary | ICD-10-CM | POA: Diagnosis not present

## 2018-03-01 DIAGNOSIS — M1712 Unilateral primary osteoarthritis, left knee: Secondary | ICD-10-CM | POA: Diagnosis not present

## 2018-03-01 DIAGNOSIS — L409 Psoriasis, unspecified: Secondary | ICD-10-CM | POA: Diagnosis not present

## 2018-03-01 DIAGNOSIS — R945 Abnormal results of liver function studies: Secondary | ICD-10-CM | POA: Diagnosis not present

## 2018-03-01 DIAGNOSIS — J060 Acute laryngopharyngitis: Secondary | ICD-10-CM | POA: Diagnosis not present

## 2018-03-01 DIAGNOSIS — M353 Polymyalgia rheumatica: Secondary | ICD-10-CM | POA: Diagnosis not present

## 2018-03-01 DIAGNOSIS — K219 Gastro-esophageal reflux disease without esophagitis: Secondary | ICD-10-CM | POA: Diagnosis not present

## 2018-03-16 DIAGNOSIS — L84 Corns and callosities: Secondary | ICD-10-CM | POA: Diagnosis not present

## 2018-03-16 DIAGNOSIS — L308 Other specified dermatitis: Secondary | ICD-10-CM | POA: Diagnosis not present

## 2018-03-16 DIAGNOSIS — Z1283 Encounter for screening for malignant neoplasm of skin: Secondary | ICD-10-CM | POA: Diagnosis not present

## 2018-03-16 DIAGNOSIS — D225 Melanocytic nevi of trunk: Secondary | ICD-10-CM | POA: Diagnosis not present

## 2018-03-29 DIAGNOSIS — K219 Gastro-esophageal reflux disease without esophagitis: Secondary | ICD-10-CM | POA: Diagnosis not present

## 2018-03-29 DIAGNOSIS — L209 Atopic dermatitis, unspecified: Secondary | ICD-10-CM | POA: Diagnosis not present

## 2018-03-29 DIAGNOSIS — R7301 Impaired fasting glucose: Secondary | ICD-10-CM | POA: Diagnosis not present

## 2018-03-29 DIAGNOSIS — I2510 Atherosclerotic heart disease of native coronary artery without angina pectoris: Secondary | ICD-10-CM | POA: Diagnosis not present

## 2018-03-29 DIAGNOSIS — M1711 Unilateral primary osteoarthritis, right knee: Secondary | ICD-10-CM | POA: Diagnosis not present

## 2018-03-29 DIAGNOSIS — Z1321 Encounter for screening for nutritional disorder: Secondary | ICD-10-CM | POA: Diagnosis not present

## 2018-03-29 DIAGNOSIS — R05 Cough: Secondary | ICD-10-CM | POA: Diagnosis not present

## 2018-03-29 DIAGNOSIS — J309 Allergic rhinitis, unspecified: Secondary | ICD-10-CM | POA: Diagnosis not present

## 2018-03-29 DIAGNOSIS — E782 Mixed hyperlipidemia: Secondary | ICD-10-CM | POA: Diagnosis not present

## 2018-03-29 DIAGNOSIS — I10 Essential (primary) hypertension: Secondary | ICD-10-CM | POA: Diagnosis not present

## 2018-03-29 DIAGNOSIS — M1712 Unilateral primary osteoarthritis, left knee: Secondary | ICD-10-CM | POA: Diagnosis not present

## 2018-03-29 DIAGNOSIS — M353 Polymyalgia rheumatica: Secondary | ICD-10-CM | POA: Diagnosis not present

## 2018-03-29 DIAGNOSIS — E559 Vitamin D deficiency, unspecified: Secondary | ICD-10-CM | POA: Diagnosis not present

## 2018-03-29 DIAGNOSIS — R945 Abnormal results of liver function studies: Secondary | ICD-10-CM | POA: Diagnosis not present

## 2018-03-29 DIAGNOSIS — E039 Hypothyroidism, unspecified: Secondary | ICD-10-CM | POA: Diagnosis not present

## 2018-03-29 DIAGNOSIS — Z853 Personal history of malignant neoplasm of breast: Secondary | ICD-10-CM | POA: Diagnosis not present

## 2018-03-30 ENCOUNTER — Ambulatory Visit (HOSPITAL_COMMUNITY)
Admission: RE | Admit: 2018-03-30 | Discharge: 2018-03-30 | Disposition: A | Discharge status: Home / Self Care | Payer: Medicare HMO | Source: Ambulatory Visit | Attending: Internal Medicine | Admitting: Internal Medicine

## 2018-03-30 ENCOUNTER — Ambulatory Visit (HOSPITAL_COMMUNITY): Payer: Medicare HMO

## 2018-03-30 DIAGNOSIS — Z1231 Encounter for screening mammogram for malignant neoplasm of breast: Secondary | ICD-10-CM | POA: Diagnosis not present

## 2018-04-06 ENCOUNTER — Ambulatory Visit (HOSPITAL_COMMUNITY): Payer: Medicare HMO

## 2018-04-18 DIAGNOSIS — I739 Peripheral vascular disease, unspecified: Secondary | ICD-10-CM | POA: Diagnosis not present

## 2018-05-10 DIAGNOSIS — M353 Polymyalgia rheumatica: Secondary | ICD-10-CM | POA: Diagnosis not present

## 2018-05-10 DIAGNOSIS — R945 Abnormal results of liver function studies: Secondary | ICD-10-CM | POA: Diagnosis not present

## 2018-05-10 DIAGNOSIS — I2510 Atherosclerotic heart disease of native coronary artery without angina pectoris: Secondary | ICD-10-CM | POA: Diagnosis not present

## 2018-05-10 DIAGNOSIS — M1712 Unilateral primary osteoarthritis, left knee: Secondary | ICD-10-CM | POA: Diagnosis not present

## 2018-05-10 DIAGNOSIS — E039 Hypothyroidism, unspecified: Secondary | ICD-10-CM | POA: Diagnosis not present

## 2018-05-10 DIAGNOSIS — Z853 Personal history of malignant neoplasm of breast: Secondary | ICD-10-CM | POA: Diagnosis not present

## 2018-05-10 DIAGNOSIS — Z23 Encounter for immunization: Secondary | ICD-10-CM | POA: Diagnosis not present

## 2018-05-10 DIAGNOSIS — K219 Gastro-esophageal reflux disease without esophagitis: Secondary | ICD-10-CM | POA: Diagnosis not present

## 2018-05-10 DIAGNOSIS — R7301 Impaired fasting glucose: Secondary | ICD-10-CM | POA: Diagnosis not present

## 2018-05-10 DIAGNOSIS — I10 Essential (primary) hypertension: Secondary | ICD-10-CM | POA: Diagnosis not present

## 2018-05-17 DIAGNOSIS — R69 Illness, unspecified: Secondary | ICD-10-CM | POA: Diagnosis not present

## 2018-05-24 ENCOUNTER — Ambulatory Visit (HOSPITAL_COMMUNITY): Payer: Medicare HMO

## 2018-05-25 ENCOUNTER — Inpatient Hospital Stay (HOSPITAL_COMMUNITY): Payer: Medicare HMO | Attending: Hematology | Admitting: Internal Medicine

## 2018-05-25 ENCOUNTER — Encounter (HOSPITAL_COMMUNITY): Payer: Self-pay | Admitting: Internal Medicine

## 2018-05-25 VITALS — BP 139/61 | HR 73 | Temp 97.5°F | Resp 16 | Wt 178.3 lb

## 2018-05-25 DIAGNOSIS — D0511 Intraductal carcinoma in situ of right breast: Secondary | ICD-10-CM

## 2018-05-25 DIAGNOSIS — Z79810 Long term (current) use of selective estrogen receptor modulators (SERMs): Secondary | ICD-10-CM | POA: Diagnosis not present

## 2018-05-25 DIAGNOSIS — Z170 Estrogen receptor positive status [ER+]: Secondary | ICD-10-CM | POA: Diagnosis not present

## 2018-05-25 DIAGNOSIS — M8580 Other specified disorders of bone density and structure, unspecified site: Secondary | ICD-10-CM | POA: Diagnosis not present

## 2018-05-25 DIAGNOSIS — R7989 Other specified abnormal findings of blood chemistry: Secondary | ICD-10-CM

## 2018-05-25 NOTE — Progress Notes (Signed)
Diagnosis Ductal carcinoma in situ (DCIS) of right breast - Plan: MM 3D SCREEN BREAST BILATERAL  Staging Cancer Staging No matching staging information was found for the patient.  Assessment and Plan:  1.  DCIS diagnosed in 2010; Lumpectomy in 2010 followed by radiation. 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 4 lymph nodes were negative. no microinvasive disease.  ER +99%. +59%.  Patient refused Tamoxifen, on Evista.    Pt has 3 D screening mammogram done 03/30/2018 that was negative.  She is recommended for repeat imaging in 03/2019.  Pt will RTC in 03/2019 for follow-up to go over labs.    2.  Elevated LFTs.  Pt brings in outside labs done 03/29/2018 that were reviewed with pt and showed WBC 6.4 HB 13.1 plts 276,000.  Chemistries WNL with Cr 0.85.  LFTs elevated with AST 71 and ALT 38.  I have discussed with her to follow-up with PCP to discuss if labs may be related to medications as Evista may cause LFT abnormalities.  She may also need imaging if LFTs remain elevated.    3.  Osteopenia.   Pt is on Evista.  This was noted on BMD done 2017.  Pt will need follow-up BMD in 2019.  She reports she will discuss that with PCP.    30 minutes spent with more than 50% spent in counseling and coordination of care.    Current Status:  Pt is seen today for follow-up.  She is here to go over labs and mammogram.     Problem List Patient Active Problem List   Diagnosis Date Noted  . Flexural atopic dermatitis [L20.89] 01/31/2018  . Itching [L29.9] 11/01/2017  . Seasonal and perennial allergic rhinitis [J30.89, J30.2] 11/01/2017  . History of breast cancer in female [Z85.3] 04/21/2016  . Routine cervical smear [Z12.4] 04/21/2016  . Vaginal atrophy [N95.2] 04/21/2016  . OVERWEIGHT [E66.3] 01/28/2010  . LEG CRAMPS, NOCTURNAL [R25.2] 01/28/2010  . ELECTROCARDIOGRAM, ABNORMAL [R94.31] 01/28/2010  . CARCINOMA IN SITU, RIGHT BREAST [D05.90] 11/27/2009  .  HYPERLIPIDEMIA [E78.5] 11/27/2009  . ARM PAIN, LEFT [M79.609] 11/27/2009  . HYPOTHYROIDISM [E03.9] 11/26/2009  . Essential hypertension [I10] 11/26/2009    Past Medical History Past Medical History:  Diagnosis Date  . Breast cancer ([place]) 2010   Right Lumpectomy and XRT  . Carotid artery disease ([facility])    Mild to moderate bilateral ICA disease, left greater than right 07/2015  . Essential hypertension   . GERD (gastroesophageal reflux disease)   . Glaucoma   . Hyperlipidemia   . Hypothyroidism   . Osteoarthritis   . Polymyalgia rheumatica (HCC)     Past Surgical History Past Surgical History:  Procedure Laterality Date  . BASAL CELL CARCINOMA EXCISION  2010   Right breast  . BREAST LUMPECTOMY Right 2010  . ENDOMETRIAL ABLATION  2002  . FINGER SURGERY     Minor surgical intervention following a crush injury to the left fourth finger    Family History Family History  Problem Relation Age of Onset  . Pneumonia Mother   . Parkinsonism Mother   . Heart failure Father      Social History  reports that she has never smoked. She has never used smokeless tobacco. She reports that she does not drink alcohol or use drugs.  Medications  Current Outpatient Medications:  .  amLODipine (NORVASC) 5 MG tablet, Take 5 mg by mouth daily.  , Disp: , Rfl:  .  aspirin 81 MG tablet, Take 81 mg by mouth daily.  , Disp: , Rfl:  .  Biotin 5000 MCG CAPS, Take 1 capsule by mouth daily., Disp: , Rfl:  .  CALCIUM CITRATE-VITAMIN D PO, Take 1 tablet by mouth daily. 630 mg Calcium 500 mg Vitamin D, Disp: , Rfl:  .  Cholecalciferol (VITAMIN D3) 2000 units TABS, Take by mouth 2 (two) times daily. , Disp: , Rfl:  .  diphenhydrAMINE (BENADRYL) 25 MG tablet, Take 25 mg by mouth every 6 (six) hours as needed., Disp: , Rfl:  .  fexofenadine (ALLEGRA) 180 MG tablet, Take 1 tablet (180 mg total) by mouth daily as needed for allergies or rhinitis., Disp: 30 tablet, Rfl: 5 .  Flaxseed, Linseed, (FLAXSEED  OIL) 1000 MG CAPS, Take by mouth daily. , Disp: , Rfl:  .  GLUCOSAMINE-CHONDROITIN PO, Take 1 tablet by mouth 2 (two) times daily. Glucosamine 1500 mg Chondroitin 1200 mg, Disp: , Rfl:  .  hydrocortisone cream 1 %, Apply 1 application topically as needed for itching., Disp: , Rfl:  .  hydrOXYzine (ATARAX/VISTARIL) 25 MG tablet, Take 1 tablet (25 mg total) by mouth every evening., Disp: 30 tablet, Rfl: 5 .  ibuprofen (ADVIL,MOTRIN) 200 MG tablet, Take 200 mg by mouth every 6 (six) hours as needed., Disp: , Rfl:  .  Ketotifen Fumarate (ALAWAY OP), Apply 1 drop to eye 2 (two) times daily as needed. Alaway 0.035% eye drops, Disp: , Rfl:  .  levothyroxine (SYNTHROID, LEVOTHROID) 137 MCG tablet, Take 137 mcg by mouth daily., Disp: , Rfl: 1 .  lisinopril (PRINIVIL,ZESTRIL) 10 MG tablet, Take 10 mg by mouth daily., Disp: , Rfl:  .  Magnesium 250 MG TABS, Take 1 tablet by mouth daily., Disp: , Rfl:  .  metroNIDAZOLE (METROGEL) 0.75 % gel, Apply 1 application topically 2 (two) times daily. Apply to cheeks as needed for redness, Disp: , Rfl:  .  Misc Natural Products (TART CHERRY ADVANCED) CAPS, Take 1 capsule by mouth daily. 1200 mg, Disp: , Rfl:  .  pantoprazole (PROTONIX) 40 MG tablet, , Disp: , Rfl:  .  Polyethyl Glycol-Propyl Glycol (SYSTANE OP), Apply 1 drop to eye 2 (two) times daily as needed. Systane Complete eye drops, Disp: , Rfl:  .  Pramoxine-Benzyl Alcohol (ITCH-X EX), Apply topically as needed. ITCH-X, Disp: , Rfl:  .  raloxifene (EVISTA) 60 MG tablet, Take 60 mg by mouth daily.  , Disp: , Rfl:  .  sodium chloride (OCEAN) 0.65 % SOLN nasal spray, Place 1 spray into both nostrils as needed for congestion., Disp: , Rfl:  .  tacrolimus (PROTOPIC) 0.1 % ointment, Apply topically 2 (two) times daily., Disp: 60 g, Rfl: 5 .  triamcinolone cream (KENALOG) 0.1 %, Apply 1 application topically 2 (two) times daily. Apply under chin for itching, Disp: , Rfl:  .  Turmeric 500 MG CAPS, Take 500 mg by mouth  daily., Disp: , Rfl:  .  vitamin B-12 (CYANOCOBALAMIN) 500 MCG tablet, Take 1,000 mcg by mouth daily. , Disp: , Rfl:   Allergies Mango flavor; Erythromycin; Ivp dye [iodinated diagnostic agents]; Penicillins; and Sulfonamide derivatives  Review of Systems Review of Systems - Oncology ROS negative.    Physical Exam  Vitals Wt Readings from Last 3 Encounters:  05/25/18 178 lb 4.8 oz (80.9 kg)  11/01/17 177 lb 3.2 oz (80.4 kg)  08/04/17 173 lb (78.5 kg)   Temp Readings from Last 3 Encounters:  05/25/18 (!) 97.5 F (36.4 C) (  Oral)  11/01/17 97.8 F (36.6 C)  05/19/17 98.7 F (37.1 C) (Oral)   BP Readings from Last 3 Encounters:  05/25/18 139/61  01/31/18 122/72  11/01/17 124/68   Pulse Readings from Last 3 Encounters:  05/25/18 73  01/31/18 67  11/01/17 73    Constitutional: Well-developed, well-nourished, and in no distress.   HENT: Head: Normocephalic and atraumatic.  Mouth/Throat: No oropharyngeal exudate. Mucosa moist. Eyes: Pupils are equal, round, and reactive to light. Conjunctivae are normal. No scleral icterus.  Neck: Normal range of motion. Neck supple. No JVD present.  Cardiovascular: Normal rate, regular rhythm and normal heart sounds.  Exam reveals no gallop and no friction rub.   No murmur heard. Pulmonary/Chest: Effort normal and breath sounds normal. No respiratory distress. No wheezes.No rales.  Abdominal: Soft. Bowel sounds are normal. No distension. There is no tenderness. There is no guarding.  Musculoskeletal: No edema or tenderness.  Lymphadenopathy: No cervical, axillary or supraclavicular adenopathy.  Neurological: Alert and oriented to person, place, and time. No cranial nerve deficit.  Skin: Skin is warm and dry. No rash noted. No erythema. No pallor.  Psychiatric: Affect and judgment normal.  Breast exam:  Chaperone present.  Right lumpectomy changes noted.  No dominant masses palpable bilaterally.    Labs No visits with results within 3  Day(s) from this visit.  Latest known visit with results is:  Office Visit on 11/01/2017  Component Date Value Ref Range Status  . Class Description 12/22/2017 Comment   Final   Comment:     Levels of Specific IgE       Class  Description of Class     ---------------------------  -----  --------------------                    < 0.10         0         Negative            0.10 -    0.31         0/I       Equivocal/Low            0.32 -    0.55         I         Low            0.56 -    1.40         II        Moderate            1.41 -    3.90         III       High            3.91 -   19.00         IV        Very High           19.01 -  100.00         V         Very High                   >100.00         VI        Very High   . IgE (Immunoglobulin E), Serum 12/22/2017 454  6 - 495 IU/mL Final                 **  Please note reference interval change**  . D Pteronyssinus IgE 12/22/2017 <0.10  Class 0 kU/L Final  . D Farinae IgE 12/22/2017 <0.10  Class 0 kU/L Final  . Cat Dander IgE 12/22/2017 <0.10  Class 0 kU/L Final  . Dog Dander IgE 12/22/2017 <0.10  Class 0 kU/L Final  . [place] Grass IgE 12/22/2017 <0.10  Class 0 kU/L Final  . Timothy Grass IgE 12/22/2017 <0.10  Class 0 kU/L Final  . Johnson Grass IgE 12/22/2017 <0.10  Class 0 kU/L Final  . Bahia Grass IgE 12/22/2017 <0.10  Class 0 kU/L Final  . Cockroach, American IgE 12/22/2017 <0.10  Class 0 kU/L Final  . Penicillium Chrysogen IgE 12/22/2017 <0.10  Class 0 kU/L Final  . Cladosporium Herbarum IgE 12/22/2017 <0.10  Class 0 kU/L Final  . Aspergillus Fumigatus IgE 12/22/2017 <0.10  Class 0 kU/L Final  . Mucor Racemosus IgE 12/22/2017 <0.10  Class 0 kU/L Final  . Alternaria Alternata IgE 12/22/2017 <0.10  Class 0 kU/L Final  . Stemphylium Herbarum IgE 12/22/2017 <0.10  Class 0 kU/L Final  . Common Silver Wendee Copp IgE 12/22/2017 <0.10  Class 0 kU/L Final  . Oak, White IgE 12/22/2017 <0.10  Class 0 kU/L Final  . Elm, American IgE  12/22/2017 <0.10  Class 0 kU/L Final  . Maple/Box Elder IgE 12/22/2017 <0.10  Class 0 kU/L Final  . Hickory, White IgE 12/22/2017 <0.10  Class 0 kU/L Final  . Amer Sycamore IgE Qn 12/22/2017 <0.10  Class 0 kU/L Final  . White Mulberry IgE 12/22/2017 <0.10  Class 0 kU/L Final  . Sweet gum IgE RAST Ql 12/22/2017 <0.10  Class 0 kU/L Final  . Cedar, Mountain IgE 12/22/2017 <0.10  Class 0 kU/L Final  . Ragweed, Short IgE 12/22/2017 <0.10  Class 0 kU/L Final  . Mugwort IgE Qn 12/22/2017 <0.10  Class 0 kU/L Final  . Plantain, English IgE 12/22/2017 <0.10  Class 0 kU/L Final  . Pigweed, Rough IgE 12/22/2017 <0.10  Class 0 kU/L Final  . Sheep Sorrel IgE Qn 12/22/2017 <0.10  Class 0 kU/L Final  . Nettle IgE 12/22/2017 <0.10  Class 0 kU/L Final  . Tryptase 12/22/2017 7.9  2.2 - 13.2 ug/L Final     Pathology Orders Placed This Encounter  Procedures  . MM 3D SCREEN BREAST BILATERAL    Standing Status:   Future    Standing Expiration Date:   07/26/2019    Order Specific Question:   Reason for Exam (SYMPTOM  OR DIAGNOSIS REQUIRED)    Answer:   DCIS    Order Specific Question:   Preferred imaging location?    Answer:   [facility]       Zoila Shutter MD

## 2018-05-25 NOTE — Patient Instructions (Signed)
[facility] at [facility] Discharge Instructions  You saw Dr. Higgs today.   Thank you for choosing [facility] at [facility] to provide your oncology and hematology care.  To afford each patient quality time with our provider, please arrive at least 15 minutes before your scheduled appointment time.   If you have a lab appointment with the Cancer Center please come in thru the  Main Entrance and check in at the main information desk  You need to re-schedule your appointment should you arrive 10 or more minutes late.  We strive to give you quality time with our providers, and arriving late affects you and other patients whose appointments are after yours.  Also, if you no show three or more times for appointments you may be dismissed from the clinic at the providers discretion.     Again, thank you for choosing [facility].  Our hope is that these requests will decrease the amount of time that you wait before being seen by our physicians.       _____________________________________________________________  Should you have questions after your visit to [facility], please contact our office at [phone] between the hours of 8:00 a.m. and 4:30 p.m.  Voicemails left after 4:00 p.m. will not be returned until the following business day.  For prescription refill requests, have your pharmacy contact our office and allow 72 hours.    Cancer Center Support Programs:   > Cancer Support Group  2nd Tuesday of the month 1pm-2pm, Journey Room    

## 2018-06-01 ENCOUNTER — Other Ambulatory Visit (HOSPITAL_COMMUNITY): Payer: Self-pay | Admitting: Internal Medicine

## 2018-06-01 DIAGNOSIS — Z1231 Encounter for screening mammogram for malignant neoplasm of breast: Secondary | ICD-10-CM

## 2018-06-27 DIAGNOSIS — I739 Peripheral vascular disease, unspecified: Secondary | ICD-10-CM | POA: Diagnosis not present

## 2018-06-28 DIAGNOSIS — I10 Essential (primary) hypertension: Secondary | ICD-10-CM | POA: Diagnosis not present

## 2018-06-28 DIAGNOSIS — R945 Abnormal results of liver function studies: Secondary | ICD-10-CM | POA: Diagnosis not present

## 2018-06-28 DIAGNOSIS — M1711 Unilateral primary osteoarthritis, right knee: Secondary | ICD-10-CM | POA: Diagnosis not present

## 2018-06-28 DIAGNOSIS — R7301 Impaired fasting glucose: Secondary | ICD-10-CM | POA: Diagnosis not present

## 2018-06-28 DIAGNOSIS — M353 Polymyalgia rheumatica: Secondary | ICD-10-CM | POA: Diagnosis not present

## 2018-06-28 DIAGNOSIS — Z23 Encounter for immunization: Secondary | ICD-10-CM | POA: Diagnosis not present

## 2018-06-28 DIAGNOSIS — Z853 Personal history of malignant neoplasm of breast: Secondary | ICD-10-CM | POA: Diagnosis not present

## 2018-06-28 DIAGNOSIS — Z6829 Body mass index (BMI) 29.0-29.9, adult: Secondary | ICD-10-CM | POA: Diagnosis not present

## 2018-06-28 DIAGNOSIS — K219 Gastro-esophageal reflux disease without esophagitis: Secondary | ICD-10-CM | POA: Diagnosis not present

## 2018-06-28 DIAGNOSIS — I2510 Atherosclerotic heart disease of native coronary artery without angina pectoris: Secondary | ICD-10-CM | POA: Diagnosis not present

## 2018-06-28 DIAGNOSIS — E039 Hypothyroidism, unspecified: Secondary | ICD-10-CM | POA: Diagnosis not present

## 2018-06-28 DIAGNOSIS — R05 Cough: Secondary | ICD-10-CM | POA: Diagnosis not present

## 2018-06-28 DIAGNOSIS — L821 Other seborrheic keratosis: Secondary | ICD-10-CM | POA: Diagnosis not present

## 2018-07-04 DIAGNOSIS — Z0000 Encounter for general adult medical examination without abnormal findings: Secondary | ICD-10-CM | POA: Diagnosis not present

## 2018-07-10 DIAGNOSIS — I2510 Atherosclerotic heart disease of native coronary artery without angina pectoris: Secondary | ICD-10-CM | POA: Diagnosis not present

## 2018-07-10 DIAGNOSIS — E782 Mixed hyperlipidemia: Secondary | ICD-10-CM | POA: Diagnosis not present

## 2018-07-10 DIAGNOSIS — E039 Hypothyroidism, unspecified: Secondary | ICD-10-CM | POA: Diagnosis not present

## 2018-07-10 DIAGNOSIS — R7301 Impaired fasting glucose: Secondary | ICD-10-CM | POA: Diagnosis not present

## 2018-07-10 DIAGNOSIS — I10 Essential (primary) hypertension: Secondary | ICD-10-CM | POA: Diagnosis not present

## 2018-07-10 DIAGNOSIS — K219 Gastro-esophageal reflux disease without esophagitis: Secondary | ICD-10-CM | POA: Diagnosis not present

## 2018-07-13 ENCOUNTER — Other Ambulatory Visit (HOSPITAL_COMMUNITY): Payer: Self-pay | Admitting: Internal Medicine

## 2018-07-13 DIAGNOSIS — Z780 Asymptomatic menopausal state: Secondary | ICD-10-CM

## 2018-07-17 ENCOUNTER — Ambulatory Visit (HOSPITAL_COMMUNITY)
Admission: RE | Admit: 2018-07-17 | Discharge: 2018-07-17 | Disposition: A | Discharge status: Home / Self Care | Payer: Medicare HMO | Source: Ambulatory Visit | Attending: Internal Medicine | Admitting: Internal Medicine

## 2018-07-17 DIAGNOSIS — M8589 Other specified disorders of bone density and structure, multiple sites: Secondary | ICD-10-CM | POA: Diagnosis not present

## 2018-07-17 DIAGNOSIS — Z780 Asymptomatic menopausal state: Secondary | ICD-10-CM | POA: Diagnosis not present

## 2018-07-21 DIAGNOSIS — L4059 Other psoriatic arthropathy: Secondary | ICD-10-CM | POA: Diagnosis not present

## 2018-07-21 DIAGNOSIS — Z6829 Body mass index (BMI) 29.0-29.9, adult: Secondary | ICD-10-CM | POA: Diagnosis not present

## 2018-07-21 DIAGNOSIS — E663 Overweight: Secondary | ICD-10-CM | POA: Diagnosis not present

## 2018-07-21 DIAGNOSIS — R945 Abnormal results of liver function studies: Secondary | ICD-10-CM | POA: Diagnosis not present

## 2018-07-21 DIAGNOSIS — M25551 Pain in right hip: Secondary | ICD-10-CM | POA: Diagnosis not present

## 2018-07-21 DIAGNOSIS — R220 Localized swelling, mass and lump, head: Secondary | ICD-10-CM | POA: Diagnosis not present

## 2018-07-21 DIAGNOSIS — M5030 Other cervical disc degeneration, unspecified cervical region: Secondary | ICD-10-CM | POA: Diagnosis not present

## 2018-07-21 DIAGNOSIS — M25561 Pain in right knee: Secondary | ICD-10-CM | POA: Diagnosis not present

## 2018-07-21 DIAGNOSIS — M8580 Other specified disorders of bone density and structure, unspecified site: Secondary | ICD-10-CM | POA: Diagnosis not present

## 2018-08-01 ENCOUNTER — Ambulatory Visit: Payer: Medicare HMO | Admitting: Allergy & Immunology

## 2018-08-02 DIAGNOSIS — Z961 Presence of intraocular lens: Secondary | ICD-10-CM | POA: Diagnosis not present

## 2018-08-02 DIAGNOSIS — H40013 Open angle with borderline findings, low risk, bilateral: Secondary | ICD-10-CM | POA: Diagnosis not present

## 2018-08-04 ENCOUNTER — Ambulatory Visit: Payer: Medicare HMO | Admitting: Allergy & Immunology

## 2018-08-04 ENCOUNTER — Encounter: Payer: Self-pay | Admitting: Allergy & Immunology

## 2018-08-04 VITALS — BP 98/64 | HR 75 | Resp 18

## 2018-08-04 DIAGNOSIS — L299 Pruritus, unspecified: Secondary | ICD-10-CM | POA: Diagnosis not present

## 2018-08-04 DIAGNOSIS — L2089 Other atopic dermatitis: Secondary | ICD-10-CM | POA: Diagnosis not present

## 2018-08-04 DIAGNOSIS — J302 Other seasonal allergic rhinitis: Secondary | ICD-10-CM | POA: Diagnosis not present

## 2018-08-04 DIAGNOSIS — J3089 Other allergic rhinitis: Secondary | ICD-10-CM | POA: Diagnosis not present

## 2018-08-04 MED ORDER — FEXOFENADINE HCL 180 MG PO TABS: 180.0000 mg | ORAL_TABLET | Freq: Every day | ORAL | 5 refills | Status: DC | PRN

## 2018-08-04 NOTE — Patient Instructions (Addendum)
1. Itching with sensitizations to grasses, indoor molds, dust mites - Continue with Allegra (fexofenadine) 180-360mg  (one to two tablets) at night.   2. Eczema - Continue with moisturizing twice daily as as you are doing. - Continue with triamcinolone ointment twice daily as needed.   3. Return in about 6 months (around 02/03/2019).   Please inform us of any Emergency Department visits, hospitalizations, or changes in symptoms. Call us before going to the ED for breathing or allergy symptoms since we might be able to fit you in for a sick visit. Feel free to contact us anytime with any questions, problems, or concerns.  It was a pleasure to see you again today! Have a fantastic Christmas!   Websites that have reliable patient information: 1. American Academy of Asthma, Allergy, and Immunology: www.aaaai.org 2. Food Allergy Research and Education (FARE): foodallergy.org 3. Mothers of Asthmatics: http://www.asthmacommunitynetwork.org 4. American College of Allergy, Asthma, and Immunology: MonthlyElectricBill.co.uk   Make sure you are registered to vote!     ECZEMA SKIN CARE REGIMEN:  Bathed and soak for 10 minutes in warm water once today. Pat dry.  Immediately apply the below creams:  To healthy skin apply Aquaphor, Eucerin, Vanicream, Cerave, or Vaseline jelly twice a day.    Use soaps and shampoos that are unscented and have the fewest amounts of additives. Some good examples include:        Allergy Shots   Allergies are the result of a chain reaction that starts in the immune system. Your immune system controls how your body defends itself. For instance, if you have an allergy to pollen, your immune system identifies pollen as an invader or allergen. Your immune system overreacts by producing antibodies called Immunoglobulin E (IgE). These antibodies travel to cells that release chemicals, causing an allergic reaction.  The concept behind allergy immunotherapy, whether it is received  in the form of shots or tablets, is that the immune system can be desensitized to specific allergens that trigger allergy symptoms. Although it requires time and patience, the payback can be long-term relief.  How Do Allergy Shots Work?  Allergy shots work much like a vaccine. Your body responds to injected amounts of a particular allergen given in increasing doses, eventually developing a resistance and tolerance to it. Allergy shots can lead to decreased, minimal or no allergy symptoms.  There generally are two phases: build-up and maintenance. Build-up often ranges from three to six months and involves receiving injections with increasing amounts of the allergens. The shots are typically given once or twice a week, though more rapid build-up schedules are sometimes used.  The maintenance phase begins when the most effective dose is reached. This dose is different for each person, depending on how allergic you are and your response to the build-up injections. Once the maintenance dose is reached, there are longer periods between injections, typically two to four weeks.  Occasionally doctors give cortisone-type shots that can temporarily reduce allergy symptoms. These types of shots are different and should not be confused with allergy immunotherapy shots.  Who Can Be Treated with Allergy Shots?  Allergy shots may be a good treatment approach for people with allergic rhinitis (hay fever), allergic asthma, conjunctivitis (eye allergy) or stinging insect allergy.   Before deciding to begin allergy shots, you should consider:  . The length of allergy season and the severity of your symptoms . Whether medications and/or changes to your environment can control your symptoms . Your desire to avoid long-term medication use .  Time: allergy immunotherapy requires a major time commitment . Cost: may vary depending on your insurance coverage  Allergy shots for children age 34 and older are effective  and often well tolerated. They might prevent the onset of new allergen sensitivities or the progression to asthma.  Allergy shots are not started on patients who are pregnant but can be continued on patients who become pregnant while receiving them. In some patients with other medical conditions or who take certain common medications, allergy shots may be of risk. It is important to mention other medications you talk to your allergist.   When Will I Feel Better?  Some may experience decreased allergy symptoms during the build-up phase. For others, it may take as long as 12 months on the maintenance dose. If there is no improvement after a year of maintenance, your allergist will discuss other treatment options with you.  If you aren't responding to allergy shots, it may be because there is not enough dose of the allergen in your vaccine or there are missing allergens that were not identified during your allergy testing. Other reasons could be that there are high levels of the allergen in your environment or major exposure to non-allergic triggers like tobacco smoke.  What Is the Length of Treatment?  Once the maintenance dose is reached, allergy shots are generally continued for three to five years. The decision to stop should be discussed with your allergist at that time. Some people may experience a permanent reduction of allergy symptoms. Others may relapse and a longer course of allergy shots can be considered.  What Are the Possible Reactions?  The two types of adverse reactions that can occur with allergy shots are local and systemic. Common local reactions include very mild redness and swelling at the injection site, which can happen immediately or several hours after. A systemic reaction, which is less common, affects the entire body or a particular body system. They are usually mild and typically respond quickly to medications. Signs include increased allergy symptoms such as sneezing, a  stuffy nose or hives.  Rarely, a serious systemic reaction called anaphylaxis can develop. Symptoms include swelling in the throat, wheezing, a feeling of tightness in the chest, nausea or dizziness. Most serious systemic reactions develop within 30 minutes of allergy shots. This is why it is strongly recommended you wait in your doctor's office for 30 minutes after your injections. Your allergist is trained to watch for reactions, and his or her staff is trained and equipped with the proper medications to identify and treat them.  Who Should Administer Allergy Shots?  The preferred location for receiving shots is your prescribing allergist's office. Injections can sometimes be given at another facility where the physician and staff are trained to recognize and treat reactions, and have received instructions by your prescribing allergist.

## 2018-08-04 NOTE — Progress Notes (Signed)
FOLLOW UP  Date of Service/Encounter:  08/04/18   Assessment:   Itching- unknown etiology  Eczema - on triamcinolone ointment BID PRN  Perennialand seasonalallergic(rhinitisgrasses, indoor molds and dust mites)  Elevated absolute eosinophil count of 700 (April 2019)     [name] presents for a follow up visit of her eczema and intense pruritis.  She seems to be doing fairly well with the [place].  She could do a little bit more with regards to moisturizing, we talked about this extensively today.  She is a variety of different rashes on her body, some of which are consistent with eczema.  There might be a component of keratosis pilaris on her arms.  I think she would make an excellent defects and candidate, but according to the patient she did not qualify for the drug assistance program from the company.  I will check with Tammy to confirm this.  She does have multiple environmental sensitizations, and allergen immunotherapy would be a consideration for her.  This is not a "magic bullet", as it does require regular visits for quite some time before reaching maintenance.  However, the immunomodulatory effects of allergen immunotherapy could certainly help with her dermatitis and eczema.  She will check with her Medicare plan to see what the coverage is.   Plan/Recommendations:   1. Itching with sensitizations to grasses, indoor molds, dust mites - Continue with Allegra (fexofenadine) 180-360mg  (one to two tablets) at night.   2. Eczema - Continue with moisturizing twice daily as as you are doing. - Continue with triamcinolone ointment twice daily as needed.   3. Return in about 6 months (around 02/03/2019).   Subjective:   Maria Ritter is a 72 y.o. female presenting today for follow up of  Chief Complaint  Patient presents with  . Follow-up    Maria Ritter has a history of the following: Patient Active Problem List   Diagnosis Date Noted  . Flexural atopic  dermatitis 01/31/2018  . Itching 11/01/2017  . Seasonal and perennial allergic rhinitis 11/01/2017  . History of breast cancer in female 04/21/2016  . Routine cervical smear 04/21/2016  . Vaginal atrophy 04/21/2016  . OVERWEIGHT 01/28/2010  . LEG CRAMPS, NOCTURNAL 01/28/2010  . ELECTROCARDIOGRAM, ABNORMAL 01/28/2010  . CARCINOMA IN SITU, RIGHT BREAST 11/27/2009  . HYPERLIPIDEMIA 11/27/2009  . ARM PAIN, LEFT 11/27/2009  . HYPOTHYROIDISM 11/26/2009  . Essential hypertension 11/26/2009    History obtained from: chart review and patient.  Drusilla Kanner Birkhead's Primary Care Provider is Celene Squibb, MD.     Maria Ritter is a 72 y.o. female presenting for a follow up visit. She was last seen in June 2019 at which time she was doing better with regards to her itching.  We continued her on Allegra 1 to 2 tablets in the morning and hydroxyzine at night for her itching.  She was doing better with the use of dust mite coverings.  For her eczema, we continued moisturizing twice daily as well as triamcinolone and Protopic.  We did discuss Dupixent in the future.  Since the last visit, she has mostly done well. She does have some increased itching at night. She is moisturizing twice daily although she will miss a time or two. She is using the Allegra at night. She has more itching at night, but the hydroxyzine knocks her out so she tries to not take it. She does take Benadryl as needed at night since that seems.   Protopic was over $100  so she never got this. She has not been on [place] but is open to other steroid free options. She has looked into Dupixent but she did not qualify for the drug assistance program through the company. 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   Otherwise, there have been no changes to her past medical history, surgical history, family history, or social  history.    Review of Systems: a 14-point review of systems is pertinent for what is mentioned in HPI.  Otherwise, all other systems were negative.  Constitutional: negative other than that listed in the HPI Eyes: negative other than that listed in the HPI Ears, nose, mouth, throat, and face: negative other than that listed in the HPI Respiratory: negative other than that listed in the HPI Cardiovascular: negative other than that listed in the HPI Gastrointestinal: negative other than that listed in the HPI Genitourinary: negative other than that listed in the HPI Integument: negative other than that listed in the HPI Hematologic: negative other than that listed in the HPI Musculoskeletal: negative other than that listed in the HPI Neurological: negative other than that listed in the HPI Allergy/Immunologic: negative other than that listed in the HPI    Objective:   Blood pressure 98/64, pulse 75, resp. rate 18, SpO2 96 %. There is no height or weight on file to calculate BMI.   Physical Exam:  General: Alert, interactive, in no acute distress. Pleasant talkative female.  Eyes: No conjunctival injection bilaterally, no discharge on the right, no discharge on the left and no Horner-Trantas dots present. PERRL bilaterally. EOMI without pain. No photophobia.  Ears: Right TM pearly gray with normal light reflex, Left TM pearly gray with normal light reflex, Right TM intact without perforation and Left TM intact without perforation.  Nose/Throat: External nose within normal limits and septum midline. Turbinates edematous with clear discharge. Posterior oropharynx erythematous without cobblestoning in the posterior oropharynx. Tonsils 2+ without exudates.  Tongue without thrush. Lungs: Clear to auscultation without wheezing, rhonchi or rales. No increased work of breathing. CV: Normal S1/S2. No murmurs. Capillary refill <2 seconds.  Skin: Warm and dry, without lesions or rashes. There is  some xerostosis present on the bilateral legs with some raised fine lesions on her bilateral upper arms.  Neuro:   Grossly intact. No focal deficits appreciated. Responsive to questions.  Diagnostic studies: none      Salvatore Marvel, MD  [facility]

## 2018-08-09 NOTE — Progress Notes (Signed)
Cardiology Office Note  Date: 08/10/2018   ID: Maria Ritter, DOB [birth year], MRN [id number]  PCP: Celene Squibb, MD  Primary Cardiologist: Rozann Lesches, MD   Chief Complaint  Patient presents with  . Cardiac follow-up    History of Present Illness: Maria Ritter is a 72 y.o. female last seen in December 2018.  She presents for a routine visit.  She continues to exercise in the cardiac rehabilitation maintenance program.  She feels like this has significantly improved her shortness of breath and also she has fewer arthritis symptoms.  She does not report any angina or palpitations.  We discussed updating carotid Dopplers.  We also went over her medications.  She plans to stop flaxseed oil and will start using over-the-counter omega-3 supplements.  Her last lipid panel showed LDL 98 with triglycerides 221.  She also has chronically, mildly increased LFTs and has been concerned about taking statin medications.  I personally reviewed her ECG today which shows sinus rhythm with decreased R wave progression and low voltage.  Past Medical History:  Diagnosis Date  . Breast cancer ([facility]) 2010   Right Lumpectomy and XRT  . Carotid artery disease (HCC)    Mild to moderate bilateral ICA disease, left greater than right 07/2015  . Essential hypertension   . GERD (gastroesophageal reflux disease)   . Glaucoma   . Hyperlipidemia   . Hypothyroidism   . Osteoarthritis   . Polymyalgia rheumatica (HCC)     Past Surgical History:  Procedure Laterality Date  . BASAL CELL CARCINOMA EXCISION  2010   Right breast  . BREAST LUMPECTOMY Right 2010  . ENDOMETRIAL ABLATION  2002  . FINGER SURGERY     Minor surgical intervention following a crush injury to the left fourth finger    Current Outpatient Medications  Medication Sig Dispense Refill  . amLODipine (NORVASC) 5 MG tablet Take 5 mg by mouth daily.      Marland Kitchen aspirin 81 MG tablet Take 81 mg by mouth daily.      Marland Kitchen CALCIUM  CITRATE-VITAMIN D PO Take 1 tablet by mouth daily. 630 mg Calcium 500 mg Vitamin D    . Cholecalciferol (VITAMIN D3) 2000 units TABS Take by mouth 2 (two) times daily.     . diphenhydrAMINE (BENADRYL) 25 MG tablet Take 25 mg by mouth every 6 (six) hours as needed.    . fexofenadine (ALLEGRA) 180 MG tablet Take 1 tablet (180 mg total) by mouth daily as needed for allergies or rhinitis. 30 tablet 5  . Flaxseed, Linseed, (FLAXSEED OIL) 1000 MG CAPS Take by mouth daily.     Marland Kitchen GLUCOSAMINE-CHONDROITIN PO Take 1 tablet by mouth 2 (two) times daily. Glucosamine 1500 mg Chondroitin 1200 mg    . hydrocortisone cream 1 % Apply 1 application topically as needed for itching.    Marland Kitchen ibuprofen (ADVIL,MOTRIN) 200 MG tablet Take 200 mg by mouth every 6 (six) hours as needed.    Marland Kitchen Ketotifen Fumarate (ALAWAY OP) Apply 1 drop to eye 2 (two) times daily as needed. Alaway 0.035% eye drops    . levothyroxine (SYNTHROID, LEVOTHROID) 137 MCG tablet Take 137 mcg by mouth daily.  1  . lisinopril (PRINIVIL,ZESTRIL) 10 MG tablet Take 10 mg by mouth daily.    . Magnesium 250 MG TABS Take 1 tablet by mouth daily.    . metroNIDAZOLE (METROGEL) 0.75 % gel Apply 1 application topically 2 (two) times daily. Apply to cheeks as needed for redness    .  Polyethyl Glycol-Propyl Glycol (SYSTANE OP) Apply 1 drop to eye 2 (two) times daily as needed. Systane Complete eye drops    . Pramoxine-Benzyl Alcohol (ITCH-X EX) Apply topically as needed. ITCH-X    . raloxifene (EVISTA) 60 MG tablet Take 60 mg by mouth daily.      . sodium chloride (OCEAN) 0.65 % SOLN nasal spray Place 1 spray into both nostrils as needed for congestion.    . triamcinolone cream (KENALOG) 0.1 % Apply 1 application topically 2 (two) times daily. Apply under chin for itching    . Turmeric 500 MG CAPS Take 500 mg by mouth daily.    . vitamin B-12 (CYANOCOBALAMIN) 500 MCG tablet Take 1,000 mcg by mouth daily.      No current facility-administered medications for this  visit.    Allergies:  Mango flavor; Erythromycin; Ivp dye [iodinated diagnostic agents]; Penicillins; and Sulfonamide derivatives   Social History: The patient  reports that she has never smoked. She has never used smokeless tobacco. She reports that she does not drink alcohol or use drugs.   ROS:  Please see the history of present illness. Otherwise, complete review of systems is positive for arthritic knee pain.  All other systems are reviewed and negative.   Physical Exam: VS:  BP 126/74 (BP Location: Left Arm)   Pulse 76   Ht 5\' 5"  (1.651 m)   Wt 177 lb (80.3 kg)   SpO2 98%   BMI 29.45 kg/m , BMI Body mass index is 29.45 kg/m.  Wt Readings from Last 3 Encounters:  08/10/18 177 lb (80.3 kg)  05/25/18 178 lb 4.8 oz (80.9 kg)  11/01/17 177 lb 3.2 oz (80.4 kg)    General: Patient appears comfortable at rest. HEENT: Conjunctiva and lids normal, oropharynx clear. Neck: Supple, no elevated JVP or carotid bruits, no thyromegaly. Lungs: Clear to auscultation, nonlabored breathing at rest. Cardiac: Regular rate and rhythm, no S3 or significant systolic murmur, no pericardial rub. Abdomen: Soft, nontender, bowel sounds present. Extremities: No pitting edema, distal pulses 2+. Skin: Warm and dry. Musculoskeletal: No kyphosis. Neuropsychiatric: Alert and oriented x3, affect grossly appropriate.  ECG: I personally reviewed the tracing from 10/14/2015 which showed normal sinus rhythm.  Recent Labwork:  April 2019: Hemoglobin 13.4, platelets 278, BUN 19, creatinine 0.76, potassium 4.8, AST 60, ALT 33, cholesterol 176, triglycerides 221, HDL 34, LDL 98 October 2019: BUN 14, creatinine 0.74, potassium 4.4, AST 54, ALT 34, TSH 1.58  Other Studies Reviewed Today:  Exercise Cardiolite 10/23/2015:  Horizontal ST segment depression ST segment depression was noted during stress in the II leads.  Defect 1: There is a medium defect of mild severity present in the mid inferolateral location. It  is non-reversible and while it is most likely due to soft tissue attenuation artifact, scar cannot entirely be excluded given the corresponding ECG abnormalities.  This is a low risk study.  Nuclear stress EF: 63%.  Carotid Dopplers 08/18/2015: IMPRESSION: Minimal to moderate amount of bilateral atherosclerotic plaque, left greater than right, not resulting in a hemodynamically significant stenosis within either internal carotid artery.  Assessment and Plan:  1.  History of abnormal Myoview with plan to continue medical therapy and exercise plan per patient preference.  She does not report any angina symptoms and states that her shortness of breath has improved with regular activities.  Her current regimen includes aspirin, Norvasc, and lisinopril.  She is not on statin therapy given concerns about side effects, she also has mildly elevated  LFTs.  2.  Essential hypertension.  Blood pressure is well controlled today.  3.  Mixed hyperlipidemia.  Plans to start over-the-counter omega-3 supplements and stop flaxseed oil.  Follow-up lipids with Dr. Nevada Crane.  4.  Asymptomatic carotid artery disease, last assessed in 2016.  A follow-up study will be obtained.  Current medicines were reviewed with the patient today.   Orders Placed This Encounter  Procedures  . EKG 12-Lead  . EKG 12-Lead    Disposition: Follow-up in 1 year.  Signed, Satira Sark, MD, [facility] 08/10/2018 11:25 AM    [place] [facility] [address] [place], [place] [address] Phone: [phone]; Fax: [phone]

## 2018-08-10 ENCOUNTER — Encounter: Payer: Self-pay | Admitting: Cardiology

## 2018-08-10 ENCOUNTER — Ambulatory Visit: Payer: Medicare HMO | Admitting: Cardiology

## 2018-08-10 VITALS — BP 126/74 | HR 76 | Ht 65.0 in | Wt 177.0 lb

## 2018-08-10 DIAGNOSIS — I10 Essential (primary) hypertension: Secondary | ICD-10-CM

## 2018-08-10 DIAGNOSIS — I6523 Occlusion and stenosis of bilateral carotid arteries: Secondary | ICD-10-CM | POA: Diagnosis not present

## 2018-08-10 DIAGNOSIS — R9430 Abnormal result of cardiovascular function study, unspecified: Secondary | ICD-10-CM

## 2018-08-10 DIAGNOSIS — E782 Mixed hyperlipidemia: Secondary | ICD-10-CM | POA: Diagnosis not present

## 2018-08-10 NOTE — Patient Instructions (Signed)
Medication Instructions:  STOP Flax Seed Oil   START Omeg 3 fish oil, 1,000 mg twice a day If you need a refill on your cardiac medications before your next appointment, please call your pharmacy.   Lab work: None If you have labs (blood work) drawn today and your tests are completely normal, you will receive your results only by: Marland Kitchen MyChart Message (if you have MyChart) OR . A paper copy in the mail If you have any lab test that is abnormal or we need to change your treatment, we will call you to review the results.  Testing/Procedures: Your physician has requested that you have a carotid duplex. This test is an ultrasound of the carotid arteries in your neck. It looks at blood flow through these arteries that supply the brain with blood. Allow one hour for this exam. There are no restrictions or special instructions.    Follow-Up: At [facility], you and your health needs are our priority.  As part of our continuing mission to provide you with exceptional heart care, we have created designated Provider Care Teams.  These Care Teams include your primary Cardiologist (physician) and Advanced Practice Providers (APPs -  Physician Assistants and Nurse Practitioners) who all work together to provide you with the care you need, when you need it. You will need a follow up appointment in 1 years.  Please call our office 2 months in advance to schedule this appointment.  You may see No primary care provider on file. or one of the following Advanced Practice Providers on your designated Care Team:   [place], PA-C [facility]) . Ermalinda Barrios, PA-C ([place])  Any Other Special Instructions Will Be Listed Below (If Applicable). None

## 2018-08-15 ENCOUNTER — Ambulatory Visit (HOSPITAL_COMMUNITY): Payer: Medicare HMO

## 2018-08-18 ENCOUNTER — Ambulatory Visit (HOSPITAL_COMMUNITY): Payer: Medicare HMO

## 2018-08-21 ENCOUNTER — Ambulatory Visit (HOSPITAL_COMMUNITY)
Admission: RE | Admit: 2018-08-21 | Discharge: 2018-08-21 | Disposition: A | Discharge status: Home / Self Care | Payer: Medicare HMO | Source: Ambulatory Visit | Attending: Cardiology | Admitting: Cardiology

## 2018-08-21 DIAGNOSIS — I6523 Occlusion and stenosis of bilateral carotid arteries: Secondary | ICD-10-CM | POA: Diagnosis not present

## 2018-09-06 DIAGNOSIS — R69 Illness, unspecified: Secondary | ICD-10-CM | POA: Diagnosis not present

## 2018-09-26 DIAGNOSIS — I739 Peripheral vascular disease, unspecified: Secondary | ICD-10-CM | POA: Diagnosis not present

## 2018-10-16 DIAGNOSIS — Z23 Encounter for immunization: Secondary | ICD-10-CM | POA: Diagnosis not present

## 2018-11-10 DIAGNOSIS — I10 Essential (primary) hypertension: Secondary | ICD-10-CM | POA: Diagnosis not present

## 2018-11-10 DIAGNOSIS — E039 Hypothyroidism, unspecified: Secondary | ICD-10-CM | POA: Diagnosis not present

## 2018-11-10 DIAGNOSIS — E782 Mixed hyperlipidemia: Secondary | ICD-10-CM | POA: Diagnosis not present

## 2018-11-10 DIAGNOSIS — R7301 Impaired fasting glucose: Secondary | ICD-10-CM | POA: Diagnosis not present

## 2018-11-23 DIAGNOSIS — R7301 Impaired fasting glucose: Secondary | ICD-10-CM | POA: Diagnosis not present

## 2018-11-23 DIAGNOSIS — M170 Bilateral primary osteoarthritis of knee: Secondary | ICD-10-CM | POA: Diagnosis not present

## 2018-11-23 DIAGNOSIS — I2510 Atherosclerotic heart disease of native coronary artery without angina pectoris: Secondary | ICD-10-CM | POA: Diagnosis not present

## 2018-11-23 DIAGNOSIS — I10 Essential (primary) hypertension: Secondary | ICD-10-CM | POA: Diagnosis not present

## 2018-11-23 DIAGNOSIS — E039 Hypothyroidism, unspecified: Secondary | ICD-10-CM | POA: Diagnosis not present

## 2018-11-23 DIAGNOSIS — J309 Allergic rhinitis, unspecified: Secondary | ICD-10-CM | POA: Diagnosis not present

## 2018-11-23 DIAGNOSIS — W5503XA Scratched by cat, initial encounter: Secondary | ICD-10-CM | POA: Diagnosis not present

## 2018-11-23 DIAGNOSIS — E782 Mixed hyperlipidemia: Secondary | ICD-10-CM | POA: Diagnosis not present

## 2018-11-23 DIAGNOSIS — L309 Dermatitis, unspecified: Secondary | ICD-10-CM | POA: Diagnosis not present

## 2018-11-23 DIAGNOSIS — R945 Abnormal results of liver function studies: Secondary | ICD-10-CM | POA: Diagnosis not present

## 2018-12-12 DIAGNOSIS — I739 Peripheral vascular disease, unspecified: Secondary | ICD-10-CM | POA: Diagnosis not present

## 2018-12-19 DIAGNOSIS — Z0000 Encounter for general adult medical examination without abnormal findings: Secondary | ICD-10-CM | POA: Diagnosis not present

## 2019-01-19 DIAGNOSIS — L4059 Other psoriatic arthropathy: Secondary | ICD-10-CM | POA: Diagnosis not present

## 2019-01-19 DIAGNOSIS — R220 Localized swelling, mass and lump, head: Secondary | ICD-10-CM | POA: Diagnosis not present

## 2019-01-19 DIAGNOSIS — M8580 Other specified disorders of bone density and structure, unspecified site: Secondary | ICD-10-CM | POA: Diagnosis not present

## 2019-01-19 DIAGNOSIS — M5030 Other cervical disc degeneration, unspecified cervical region: Secondary | ICD-10-CM | POA: Diagnosis not present

## 2019-01-19 DIAGNOSIS — M25551 Pain in right hip: Secondary | ICD-10-CM | POA: Diagnosis not present

## 2019-01-19 DIAGNOSIS — R945 Abnormal results of liver function studies: Secondary | ICD-10-CM | POA: Diagnosis not present

## 2019-01-19 DIAGNOSIS — M25561 Pain in right knee: Secondary | ICD-10-CM | POA: Diagnosis not present

## 2019-01-24 DIAGNOSIS — R69 Illness, unspecified: Secondary | ICD-10-CM | POA: Diagnosis not present

## 2019-01-29 HISTORY — PX: TOOTH EXTRACTION: SUR596

## 2019-02-09 ENCOUNTER — Ambulatory Visit: Payer: Medicare HMO | Admitting: Allergy & Immunology

## 2019-02-09 ENCOUNTER — Encounter: Payer: Self-pay | Admitting: Allergy & Immunology

## 2019-02-09 ENCOUNTER — Other Ambulatory Visit: Payer: Self-pay

## 2019-02-09 VITALS — BP 118/66 | HR 67 | Temp 97.7°F | Resp 16

## 2019-02-09 DIAGNOSIS — L2089 Other atopic dermatitis: Secondary | ICD-10-CM | POA: Diagnosis not present

## 2019-02-09 DIAGNOSIS — J302 Other seasonal allergic rhinitis: Secondary | ICD-10-CM | POA: Diagnosis not present

## 2019-02-09 DIAGNOSIS — J3089 Other allergic rhinitis: Secondary | ICD-10-CM | POA: Diagnosis not present

## 2019-02-09 NOTE — Progress Notes (Signed)
FOLLOW UP  Date of Service/Encounter:  02/09/19   Assessment:   Itching- with some clear environmental allergic triggers   Eczema - on triamcinolone ointment BID PRN (declined Dupixent)  Perennialand seasonalallergicrhinitis(grasses, indoor molds and dust mites)  Elevated absolute eosinophil count of 700 (April 2019)    The patient is doing remarkably well on Allegra 1-2 tablets nightly.  She will even skip some days if she is not having intense itching.  Overall, her disease process seems to be under good control.  She does endorse some occasional rhinitis at night, which she does not feel compelled to treat.  I did offer her the use of a nasal steroid today, but she declines to use this.  She prefers to just use nasal saline.  We discussed allergen immunotherapy again today, but I feel that with her disease process being under control right now, there is no need to proceed with this.  She is not particularly enthralled with the idea anyway.  We will space her out to every 12 months, although I did tell her that we do have same-day appointment slots if needed.  Plan/Recommendations:   1. Itching with sensitizations to grasses, indoor molds, dust mites - Continue with Allegra (fexofenadine) 180-375m (one to two tablets) at night.   2. Eczema - Continue with moisturizing twice daily as as you are doing. - Continue with triamcinolone ointment twice daily as needed.   3. Return in about 1 year (around 02/09/2020). This can be an in-person, a virtual Webex or a telephone follow up visit.  Subjective:   Maria WHITEHORNis a 73y.o. female presenting today for follow up of  Chief Complaint  Patient presents with  . Allergic Rhinitis     Maria ELLINWOODhas a history of the following: Patient Active Problem List   Diagnosis Date Noted  . Flexural atopic dermatitis 01/31/2018  . Itching 11/01/2017  . Seasonal and perennial allergic rhinitis 11/01/2017  . History of  breast cancer in female 04/21/2016  . Routine cervical smear 04/21/2016  . Vaginal atrophy 04/21/2016  . OVERWEIGHT 01/28/2010  . LEG CRAMPS, NOCTURNAL 01/28/2010  . ELECTROCARDIOGRAM, ABNORMAL 01/28/2010  . CARCINOMA IN SITU, RIGHT BREAST 11/27/2009  . HYPERLIPIDEMIA 11/27/2009  . ARM PAIN, LEFT 11/27/2009  . HYPOTHYROIDISM 11/26/2009  . Essential hypertension 11/26/2009    History obtained from: chart review and patient.  [name] a 73y.o. female presenting for a follow up visit.  She was last seen in December 2019.  At that time, her itching was fairly well controlled with Allegra 1 to 2 tablets nightly.  We also continued her moisturizing twice daily triamcinolone twice daily as needed.  Since her last visit, she has done very well.  Her itching is controlled with Allegra 1 to 2 tablets nightly.  She will even go several days without even eating and Allegra.  Her sleep quality is much better than it was when I first met her.  Itching is certainly less intense.  She did have a breakout on her ankles after mowing the lawn the other day, which improved after she rubbed the area with Purell.  There was quite a bit of stinging with this.  She does report some rhinitis which is worse at night.  She will use some nasal saline occasionally and roll over with improvement in the rhinitis.  She has not needed antibiotics at all since last visit.  Otherwise, there have been no changes to her past medical history,  surgical history, family history, or social history.  She continues to raise her stray dogs and cats.  She also shares with me that she has a turtle obsession.  At one point she at 51 turtles living in her home.    Review of Systems  Constitutional: Negative.  Negative for fever, malaise/fatigue and weight loss.  HENT: Negative.  Negative for congestion, ear discharge and ear pain.   Eyes: Negative for pain, discharge and redness.  Respiratory: Negative for cough, sputum production,  shortness of breath and wheezing.   Cardiovascular: Negative.  Negative for chest pain and palpitations.  Gastrointestinal: Negative for abdominal pain, heartburn, nausea and vomiting.  Skin: Positive for itching. Negative for rash.  Neurological: Negative for dizziness and headaches.  Endo/Heme/Allergies: Negative for environmental allergies. Does not bruise/bleed easily.       Objective:   Blood pressure 118/66, pulse 67, temperature 97.7 F (36.5 C), temperature source Temporal, resp. rate 16, SpO2 96 %. There is no height or weight on file to calculate BMI.   Physical Exam:  Physical Exam  Constitutional: She appears well-developed.  Pleasant talkative female.  HENT:  Head: Normocephalic and atraumatic.  Right Ear: Tympanic membrane, external ear and ear canal normal.  Left Ear: Tympanic membrane, external ear and ear canal normal.  Nose: Mucosal edema and rhinorrhea present. No nasal deformity or septal deviation. No epistaxis. Right sinus exhibits no maxillary sinus tenderness and no frontal sinus tenderness. Left sinus exhibits no maxillary sinus tenderness and no frontal sinus tenderness.  Mouth/Throat: Uvula is midline and oropharynx is clear and moist. Mucous membranes are not pale and not dry.  Eyes: Pupils are equal, round, and reactive to light. Conjunctivae and EOM are normal. Right eye exhibits no chemosis and no discharge. Left eye exhibits no chemosis and no discharge. Right conjunctiva is not injected. Left conjunctiva is not injected.  Cardiovascular: Normal rate, regular rhythm and normal heart sounds.  Respiratory: Effort normal and breath sounds normal. No accessory muscle usage. No tachypnea. No respiratory distress. She has no wheezes. She has no rhonchi. She has no rales. She exhibits no tenderness.  Moving air well in all lung fields.   Lymphadenopathy:    She has no cervical adenopathy.  Neurological: She is alert.  Skin: Bruising noted. No abrasion, no  petechiae and no rash noted. Rash is not papular, not vesicular and not urticarial. No erythema. No pallor.  There are some excoriations on her bilateral ankles. There is no honey crusting noted at all. Brusing noted on her elbows (she reports that she is clumsy and runs into door jams occasionally).    Psychiatric: She has a normal mood and affect.     Diagnostic studies: none     Salvatore Marvel, MD  [facility] and [facility]

## 2019-02-09 NOTE — Patient Instructions (Addendum)
1. Itching with sensitizations to grasses, indoor molds, dust mites - Continue with Allegra (fexofenadine) 180-360mg  (one to two tablets) at night.   2. Eczema - Continue with moisturizing twice daily as as you are doing. - Continue with triamcinolone ointment twice daily as needed.   3. Return in about 1 year (around 02/09/2020). This can be an in-person, a virtual Webex or a telephone follow up visit.   Please inform us of any Emergency Department visits, hospitalizations, or changes in symptoms. Call us before going to the ED for breathing or allergy symptoms since we might be able to fit you in for a sick visit. Feel free to contact us anytime with any questions, problems, or concerns.  It was a pleasure to see you again today!  Websites that have reliable patient information: 1. American Academy of Asthma, Allergy, and Immunology: www.aaaai.org 2. Food Allergy Research and Education (FARE): foodallergy.org 3. Mothers of Asthmatics: http://www.asthmacommunitynetwork.org 4. American College of Allergy, Asthma, and Immunology: www.acaai.org  "Like" [place] on Facebook and Instagram for our latest updates!      Make sure you are registered to vote! If you have moved or changed any of your contact information, you will need to get this updated before voting!    Voter ID laws are NOT going into effect for the General Election in November 2020! DO NOT let this stop you from exercising your right to vote!   Absentee voting is the SAFEST way to vote during the coronavirus pandemic! Download and print an absentee ballot request form at https://s3.amazonaws.com/dl.ThisMLS.nl.pdf  More information on absentee ballots can be found here: https://www.ncvoter.org/absentee-ballots/   Reducing Pollen Exposure  The American Academy of Allergy, Asthma and Immunology suggests the following steps to reduce your exposure to pollen during allergy seasons.    1. Do not  hang sheets or clothing out to dry; pollen may collect on these items. 2. Do not mow lawns or spend time around freshly cut grass; mowing stirs up pollen. 3. Keep windows closed at night.  Keep car windows closed while driving. 4. Minimize morning activities outdoors, a time when pollen counts are usually at their highest. 5. Stay indoors as much as possible when pollen counts or humidity is high and on windy days when pollen tends to remain in the air longer. 6. Use air conditioning when possible.  Many air conditioners have filters that trap the pollen spores. 7. Use a HEPA room air filter to remove pollen form the indoor air you breathe.  Control of House Dust Mite Allergen    House dust mites play a major role in allergic asthma and rhinitis.  They occur in environments with high humidity wherever human skin, the food for dust mites is found. High levels have been detected in dust obtained from mattresses, pillows, carpets, upholstered furniture, bed covers, clothes and soft toys.  The principal allergen of the house dust mite is found in its feces.  A gram of dust may contain 1,000 mites and 250,000 fecal particles.  Mite antigen is easily measured in the air during house cleaning activities.    1. Encase mattresses, including the box spring, and pillow, in an air tight cover.  Seal the zipper end of the encased mattresses with wide adhesive tape. 2. Wash the bedding in water of 130 degrees Farenheit weekly.  Avoid cotton comforters/quilts and flannel bedding: the most ideal bed covering is the dacron comforter. 3. Remove all upholstered furniture from the bedroom. 4. Remove carpets, carpet padding, rugs,  and non-washable window drapes from the bedroom.  Wash drapes weekly or use plastic window coverings. 5. Remove all non-washable stuffed toys from the bedroom.  Wash stuffed toys weekly. 6. Have the room cleaned frequently with a vacuum cleaner and a damp dust-mop.  The patient should not  be in a room which is being cleaned and should wait 1 hour after cleaning before going into the room. 7. Close and seal all heating outlets in the bedroom.  Otherwise, the room will become filled with dust-laden air.  An electric heater can be used to heat the room. 8. Reduce indoor humidity to less than 50%.  Do not use a humidifier.  Control of Mold Allergen   Mold and fungi can grow on a variety of surfaces provided certain temperature and moisture conditions exist.  Outdoor molds grow on plants, decaying vegetation and soil.  The major outdoor mold, Alternaria and Cladosporium, are found in very high numbers during hot and dry conditions.  Generally, a late Summer - Fall peak is seen for common outdoor fungal spores.  Rain will temporarily lower outdoor mold spore count, but counts rise rapidly when the rainy period ends.  The most important indoor molds are Aspergillus and Penicillium.  Dark, humid and poorly ventilated basements are ideal sites for mold growth.  The next most common sites of mold growth are the bathroom and the kitchen.   Indoor (Perennial) Mold Control    1. Maintain humidity below 50%. 2. Clean washable surfaces with 5% bleach solution. 3. Remove sources e.g. contaminated carpets.

## 2019-02-12 DIAGNOSIS — W57XXXA Bitten or stung by nonvenomous insect and other nonvenomous arthropods, initial encounter: Secondary | ICD-10-CM | POA: Diagnosis not present

## 2019-02-12 DIAGNOSIS — L03119 Cellulitis of unspecified part of limb: Secondary | ICD-10-CM | POA: Diagnosis not present

## 2019-02-12 DIAGNOSIS — R69 Illness, unspecified: Secondary | ICD-10-CM | POA: Diagnosis not present

## 2019-03-06 DIAGNOSIS — I739 Peripheral vascular disease, unspecified: Secondary | ICD-10-CM | POA: Diagnosis not present

## 2019-03-14 DIAGNOSIS — R69 Illness, unspecified: Secondary | ICD-10-CM | POA: Diagnosis not present

## 2019-03-16 DIAGNOSIS — I10 Essential (primary) hypertension: Secondary | ICD-10-CM | POA: Diagnosis not present

## 2019-03-16 DIAGNOSIS — E782 Mixed hyperlipidemia: Secondary | ICD-10-CM | POA: Diagnosis not present

## 2019-03-16 DIAGNOSIS — E039 Hypothyroidism, unspecified: Secondary | ICD-10-CM | POA: Diagnosis not present

## 2019-03-16 DIAGNOSIS — R7301 Impaired fasting glucose: Secondary | ICD-10-CM | POA: Diagnosis not present

## 2019-03-22 ENCOUNTER — Encounter: Payer: Self-pay | Admitting: Internal Medicine

## 2019-03-22 DIAGNOSIS — E039 Hypothyroidism, unspecified: Secondary | ICD-10-CM | POA: Diagnosis not present

## 2019-03-22 DIAGNOSIS — I10 Essential (primary) hypertension: Secondary | ICD-10-CM | POA: Diagnosis not present

## 2019-03-22 DIAGNOSIS — I2510 Atherosclerotic heart disease of native coronary artery without angina pectoris: Secondary | ICD-10-CM | POA: Diagnosis not present

## 2019-03-22 DIAGNOSIS — E782 Mixed hyperlipidemia: Secondary | ICD-10-CM | POA: Diagnosis not present

## 2019-03-22 DIAGNOSIS — M170 Bilateral primary osteoarthritis of knee: Secondary | ICD-10-CM | POA: Diagnosis not present

## 2019-03-22 DIAGNOSIS — R7301 Impaired fasting glucose: Secondary | ICD-10-CM | POA: Diagnosis not present

## 2019-03-22 DIAGNOSIS — R945 Abnormal results of liver function studies: Secondary | ICD-10-CM | POA: Diagnosis not present

## 2019-03-22 DIAGNOSIS — J309 Allergic rhinitis, unspecified: Secondary | ICD-10-CM | POA: Diagnosis not present

## 2019-03-22 DIAGNOSIS — Z853 Personal history of malignant neoplasm of breast: Secondary | ICD-10-CM | POA: Diagnosis not present

## 2019-03-22 DIAGNOSIS — L309 Dermatitis, unspecified: Secondary | ICD-10-CM | POA: Diagnosis not present

## 2019-04-02 ENCOUNTER — Other Ambulatory Visit: Payer: Self-pay

## 2019-04-02 ENCOUNTER — Ambulatory Visit (HOSPITAL_COMMUNITY)
Admission: RE | Admit: 2019-04-02 | Discharge: 2019-04-02 | Disposition: A | Discharge status: Home / Self Care | Payer: Medicare HMO | Source: Ambulatory Visit | Attending: Internal Medicine | Admitting: Internal Medicine

## 2019-04-02 DIAGNOSIS — Z1231 Encounter for screening mammogram for malignant neoplasm of breast: Secondary | ICD-10-CM | POA: Diagnosis not present

## 2019-04-04 ENCOUNTER — Other Ambulatory Visit (HOSPITAL_COMMUNITY): Payer: Self-pay | Admitting: Internal Medicine

## 2019-04-04 DIAGNOSIS — M62838 Other muscle spasm: Secondary | ICD-10-CM | POA: Diagnosis not present

## 2019-04-04 DIAGNOSIS — L03818 Cellulitis of other sites: Secondary | ICD-10-CM | POA: Diagnosis not present

## 2019-04-04 DIAGNOSIS — R928 Other abnormal and inconclusive findings on diagnostic imaging of breast: Secondary | ICD-10-CM

## 2019-04-04 DIAGNOSIS — R21 Rash and other nonspecific skin eruption: Secondary | ICD-10-CM | POA: Diagnosis not present

## 2019-04-09 ENCOUNTER — Other Ambulatory Visit: Payer: Medicare HMO | Admitting: Adult Health

## 2019-04-10 ENCOUNTER — Other Ambulatory Visit (HOSPITAL_COMMUNITY): Payer: Self-pay | Admitting: Internal Medicine

## 2019-04-10 ENCOUNTER — Ambulatory Visit (HOSPITAL_COMMUNITY)
Admission: RE | Admit: 2019-04-10 | Discharge: 2019-04-10 | Disposition: A | Discharge status: Home / Self Care | Payer: Medicare HMO | Source: Ambulatory Visit | Attending: Internal Medicine | Admitting: Internal Medicine

## 2019-04-10 ENCOUNTER — Other Ambulatory Visit: Payer: Self-pay

## 2019-04-10 DIAGNOSIS — R590 Localized enlarged lymph nodes: Secondary | ICD-10-CM | POA: Diagnosis not present

## 2019-04-10 DIAGNOSIS — N6310 Unspecified lump in the right breast, unspecified quadrant: Secondary | ICD-10-CM

## 2019-04-10 DIAGNOSIS — R928 Other abnormal and inconclusive findings on diagnostic imaging of breast: Secondary | ICD-10-CM | POA: Insufficient documentation

## 2019-04-17 ENCOUNTER — Ambulatory Visit (HOSPITAL_COMMUNITY)
Admission: RE | Admit: 2019-04-17 | Discharge: 2019-04-17 | Disposition: A | Discharge status: Home / Self Care | Payer: Medicare HMO | Source: Ambulatory Visit | Attending: Internal Medicine | Admitting: Internal Medicine

## 2019-04-17 ENCOUNTER — Other Ambulatory Visit: Payer: Self-pay

## 2019-04-17 ENCOUNTER — Other Ambulatory Visit (HOSPITAL_COMMUNITY): Payer: Self-pay | Admitting: Internal Medicine

## 2019-04-17 ENCOUNTER — Encounter (HOSPITAL_COMMUNITY): Payer: Self-pay

## 2019-04-17 ENCOUNTER — Ambulatory Visit (HOSPITAL_COMMUNITY): Payer: Medicare HMO | Admitting: Hematology

## 2019-04-17 DIAGNOSIS — R928 Other abnormal and inconclusive findings on diagnostic imaging of breast: Secondary | ICD-10-CM

## 2019-04-17 DIAGNOSIS — N6310 Unspecified lump in the right breast, unspecified quadrant: Secondary | ICD-10-CM | POA: Insufficient documentation

## 2019-04-17 DIAGNOSIS — R590 Localized enlarged lymph nodes: Secondary | ICD-10-CM | POA: Diagnosis not present

## 2019-04-17 DIAGNOSIS — D763 Other histiocytosis syndromes: Secondary | ICD-10-CM | POA: Diagnosis not present

## 2019-04-17 MED ORDER — LIDOCAINE-EPINEPHRINE (PF) 1 %-1:200000 IJ SOLN
INTRAMUSCULAR | Status: AC
  Filled 2019-04-17: qty 30

## 2019-04-17 MED ORDER — LIDOCAINE HCL (PF) 1 % IJ SOLN
INTRAMUSCULAR | Status: AC
  Filled 2019-04-17: qty 5

## 2019-04-24 ENCOUNTER — Encounter (HOSPITAL_COMMUNITY): Payer: Self-pay | Admitting: Hematology

## 2019-04-24 ENCOUNTER — Inpatient Hospital Stay (HOSPITAL_COMMUNITY): Payer: Medicare HMO | Attending: Hematology | Admitting: Hematology

## 2019-04-24 ENCOUNTER — Other Ambulatory Visit: Payer: Self-pay

## 2019-04-24 DIAGNOSIS — Z79899 Other long term (current) drug therapy: Secondary | ICD-10-CM | POA: Insufficient documentation

## 2019-04-24 DIAGNOSIS — Z170 Estrogen receptor positive status [ER+]: Secondary | ICD-10-CM | POA: Diagnosis not present

## 2019-04-24 DIAGNOSIS — R590 Localized enlarged lymph nodes: Secondary | ICD-10-CM | POA: Diagnosis not present

## 2019-04-24 DIAGNOSIS — E785 Hyperlipidemia, unspecified: Secondary | ICD-10-CM | POA: Insufficient documentation

## 2019-04-24 DIAGNOSIS — D0511 Intraductal carcinoma in situ of right breast: Secondary | ICD-10-CM | POA: Diagnosis not present

## 2019-04-24 DIAGNOSIS — Z7982 Long term (current) use of aspirin: Secondary | ICD-10-CM | POA: Insufficient documentation

## 2019-04-24 DIAGNOSIS — Z79810 Long term (current) use of selective estrogen receptor modulators (SERMs): Secondary | ICD-10-CM | POA: Insufficient documentation

## 2019-04-24 DIAGNOSIS — R748 Abnormal levels of other serum enzymes: Secondary | ICD-10-CM | POA: Insufficient documentation

## 2019-04-24 DIAGNOSIS — E039 Hypothyroidism, unspecified: Secondary | ICD-10-CM | POA: Insufficient documentation

## 2019-04-24 DIAGNOSIS — M8580 Other specified disorders of bone density and structure, unspecified site: Secondary | ICD-10-CM | POA: Insufficient documentation

## 2019-04-24 DIAGNOSIS — I10 Essential (primary) hypertension: Secondary | ICD-10-CM | POA: Insufficient documentation

## 2019-04-24 DIAGNOSIS — Z923 Personal history of irradiation: Secondary | ICD-10-CM | POA: Insufficient documentation

## 2019-04-24 NOTE — Assessment & Plan Note (Signed)
1.  Right breast DCIS: -Diagnosed on 09/25/2008 after right breast biopsy. - Right breast lumpectomy and lymph node biopsy on 10/24/2008.  Pathology shows high-grade DCIS, negative margins, 1.9 cm, 0/4 lymph nodes positive, ER 99% positive, PR 59% positive, free margins.  pTis, PN0. - Patient was given options of tamoxifen and Evista.  She chose to proceed with every step.  She has been on it since then. -Mammogram on 04/02/2019 was BI-RADS Category 0 with additional evaluation of the right breast recommended. - Ultrasound on 04/10/2019 shows enlarged right axillary lymph node with abnormal morphology, measuring 1.1 cm. - Ultrasound-guided core biopsy of the right axillary lymph node on 04/17/2019 shows reactive lymph node with sinus histiocytosis with no carcinoma identified. - Physical examination today did not show any palpable adenopathy.  Vague tenderness at the site of biopsy present.  Lumpectomy site is within normal limits. - I had a prolonged discussion with the patient about the biopsy results.  I have recommended consultation with Dr. Arnoldo Morale for possible removal of the lymph node.  She is agreeable.  We made a appointment. -I will see her back 3 to 4 weeks after surgery.  I have told her that she may discontinue Evista as there is no data supporting use beyond 5 to 7 years.  2.  Osteopenia: -DEXA scan on 07/17/2018 shows T score of -1.4.  This is stable compared to DEXA scan in 2017. - I have reviewed lab reports which showed normal calcium at 9.4.  3.  Elevated liver enzymes: -She had elevated AST and ALT, mild at 61 and 36 respectively.  She had this elevation last year also. -I have reviewed CT abdomen from 2011 which showed mild diffuse fatty infiltration of the liver, which could be contributing to this enzyme elevation.

## 2019-04-24 NOTE — Progress Notes (Signed)
Maria [address] [place] [address]   CLINIC:  Medical Oncology/Hematology  PCP:  Celene Squibb, MD [place] [address] [phone]   REASON FOR VISIT:  Follow-up for right breast DCIS in the right axillary lymphadenopathy which is new.  CURRENT THERAPY: Evista daily.   INTERVAL HISTORY:  Maria Ritter 73 y.o. female is seen for follow-up of right breast DCIS.  She had an abnormal finding on mammogram on 04/02/2019 which was BI-RADS Category 0.  An ultrasound of the right breast was requested.  It was done on 04/10/2019 which showed enlarged right axillary node, measuring 1.1 cm with abnormal morphology.  She underwent biopsy of the lymph node on 04/17/2019.  She has been on Evista since her diagnosis in 2010.  She is tolerating it very well.  She denies any bone fractures.  Reportedly 1 of her family members had ovarian cancer.  Appetite and energy levels are reported as 100%.  Denies any fevers, night sweats or weight loss in the last 6 months.    REVIEW OF SYSTEMS:  Review of Systems  All other systems reviewed and are negative.    PAST MEDICAL/SURGICAL HISTORY:  Past Medical History:  Diagnosis Date  . Breast cancer ([place]) 2010   Right Lumpectomy and XRT  . Carotid artery disease ([facility])    Mild to moderate bilateral ICA disease, left greater than right 07/2015  . Essential hypertension   . GERD (gastroesophageal reflux disease)   . Glaucoma   . Hyperlipidemia   . Hypothyroidism   . Osteoarthritis   . Polymyalgia rheumatica ([facility])    Past Surgical History:  Procedure Laterality Date  . BASAL CELL CARCINOMA EXCISION  2010   Right breast  . BREAST LUMPECTOMY Right 2010  . ENDOMETRIAL ABLATION  2002  . FINGER SURGERY     Minor surgical intervention following a crush injury to the left fourth finger  . TOOTH EXTRACTION  01/2019     SOCIAL HISTORY:  Social History   Socioeconomic History  . Marital status: Widowed   Spouse name: Not on file  . Number of children: Not on file  . Years of education: Not on file  . Highest education level: Not on file  Occupational History  . Occupation: Retired from [employer]: RETIRED  Social Needs  . Financial resource strain: Not on file  . Food insecurity    Worry: Not on file    Inability: Not on file  . Transportation needs    Medical: Not on file    Non-medical: Not on file  Tobacco Use  . Smoking status: Never Smoker  . Smokeless tobacco: Never Used  Substance and Sexual Activity  . Alcohol use: No    Alcohol/week: 0.0 standard drinks  . Drug use: No  . Sexual activity: Not Currently    Birth control/protection: Surgical    Comment: ablation  Lifestyle  . Physical activity    Days per week: Not on file    Minutes per session: Not on file  . Stress: Not on file  Relationships  . Social Herbalist on phone: Not on file    Gets together: Not on file    Attends religious service: Not on file    Active member of club or organization: Not on file    Attends meetings of clubs or organizations: Not on file    Relationship status: Not on file  .  Intimate partner violence    Fear of current or ex partner: Not on file    Emotionally abused: Not on file    Physically abused: Not on file    Forced sexual activity: Not on file  Other Topics Concern  . Not on file  Social History Narrative   Widowed with no children    FAMILY HISTORY:  Family History  Problem Relation Age of Onset  . Pneumonia Mother   . Parkinsonism Mother   . Heart failure Father     CURRENT MEDICATIONS:  Outpatient Encounter Medications as of 04/24/2019  Medication Sig  . amLODipine (NORVASC) 5 MG tablet Take 5 mg by mouth daily.    Marland Kitchen aspirin 81 MG tablet Take 81 mg by mouth daily.    Marland Kitchen CALCIUM CITRATE-VITAMIN D PO Take 1 tablet by mouth daily. 630 mg Calcium 500 mg Vitamin D  . Cholecalciferol (VITAMIN D3) 2000 units TABS Take by mouth 2 (two) times  daily.   Marland Kitchen GLUCOSAMINE-CHONDROITIN PO Take 1 tablet by mouth 2 (two) times daily. Glucosamine 1500 mg Chondroitin 1200 mg  . levothyroxine (SYNTHROID, LEVOTHROID) 137 MCG tablet Take 137 mcg by mouth daily.  Marland Kitchen lisinopril (PRINIVIL,ZESTRIL) 10 MG tablet Take 10 mg by mouth daily.  . Magnesium 250 MG TABS Take 1 tablet by mouth daily.  . Omega-3 1000 MG CAPS Take 1,000 mg by mouth 2 (two) times daily.  Vladimir Faster Glycol-Propyl Glycol (SYSTANE OP) Apply 1 drop to eye 2 (two) times daily as needed. Systane Complete eye drops  . raloxifene (EVISTA) 60 MG tablet Take 60 mg by mouth daily.    . sodium chloride (OCEAN) 0.65 % SOLN nasal spray Place 1 spray into both nostrils as needed for congestion.  . triamcinolone cream (KENALOG) 0.1 % Apply 1 application topically 2 (two) times daily. Apply under chin for itching  . Turmeric 500 MG CAPS Take 500 mg by mouth daily.  . vitamin B-12 (CYANOCOBALAMIN) 500 MCG tablet Take 1,000 mcg by mouth daily.   . diphenhydrAMINE (BENADRYL) 25 MG tablet Take 25 mg by mouth every 6 (six) hours as needed.  . fexofenadine (ALLEGRA) 180 MG tablet Take 1 tablet (180 mg total) by mouth daily as needed for allergies or rhinitis. (Patient not taking: Reported on 04/24/2019)  . hydrocortisone cream 1 % Apply 1 application topically as needed for itching.  Marland Kitchen ibuprofen (ADVIL,MOTRIN) 200 MG tablet Take 200 mg by mouth every 6 (six) hours as needed.  . methocarbamol (ROBAXIN) 500 MG tablet TAKE 1 TABLET BY MOUTH THREE TIMES DAILY AS NEEDED FOR SPASMS  . metroNIDAZOLE (METROGEL) 0.75 % gel Apply 1 application topically 2 (two) times daily. Apply to cheeks as needed for redness  . Pramoxine-Benzyl Alcohol (ITCH-X EX) Apply topically as needed. ITCH-X  . [DISCONTINUED] doxycycline (VIBRA-TABS) 100 MG tablet Take 100 mg by mouth 2 (two) times daily.  . [DISCONTINUED] Ketotifen Fumarate (ALAWAY OP) Apply 1 drop to eye 2 (two) times daily as needed. [place]away 0.035% eye drops   No  facility-administered encounter medications on file as of 04/24/2019.     ALLERGIES:  Allergies  Allergen Reactions  . Mango Flavor Hives  . Erythromycin   . Ivp Dye [Iodinated Diagnostic Agents]   . Penicillins   . Sulfonamide Derivatives   . Neosporin [Bacitracin-Polymyxin B] Itching     PHYSICAL EXAM:  ECOG Performance status: 1  Vitals:   04/24/19 1000  BP: 139/69  Pulse: 71  Resp: 16  Temp: (!) 97.3 F (  36.3 C)  SpO2: 98%   Filed Weights   04/24/19 1000  Weight: 176 lb (79.8 kg)    Physical Exam Vitals signs reviewed.  Constitutional:      Appearance: Normal appearance.  Cardiovascular:     Rate and Rhythm: Normal rate and regular rhythm.     Heart sounds: Normal heart sounds.  Pulmonary:     Effort: Pulmonary effort is normal.     Breath sounds: Normal breath sounds.  Abdominal:     General: There is no distension.     Palpations: Abdomen is soft. There is no mass.  Musculoskeletal:        General: No swelling.  Lymphadenopathy:     Cervical: No cervical adenopathy.  Skin:    General: Skin is warm.  Neurological:     General: No focal deficit present.     Mental Status: She is alert and oriented to person, place, and time.  Psychiatric:        Mood and Affect: Mood normal.        Behavior: Behavior normal.   Right breast lumpectomy site is within normal limits.  Lymph node in the right axilla is not palpable.   LABORATORY DATA:  I have reviewed the labs as listed.  CBC    Component Value Date/Time   WBC 6.4 11/27/2009 0000   RBC 4.42 10/21/2008 0952   HGB 13.5 11/27/2009 0000   HCT 42.6 11/27/2009 0000   PLT 307 11/27/2009 0000   MCV 89.5 11/27/2009 0000   MCHC 34.0 10/21/2008 0952   RDW 13.9 10/21/2008 0952   CMP Latest Ref Rng & Units 01/08/2010 11/27/2009 11/04/2009  Glucose 70 - 99 mg/dL 97 100 100  BUN 6 - 23 mg/dL 19 17 17   Creatinine 0.40 - 1.20 mg/dL 0.93 0.83 0.83  Sodium 135 - 145 meq/L 140 139 139  Potassium 3.5 - 5.3 meq/L  4.7 4.4 4.4  Chloride 96 - 112 meq/L 101 100 100  CO2 19 - 32 meq/L 29 30 30   Calcium 8.4 - 10.5 mg/dL 9.4 8.9 8.9  Total Protein 6.0 - 8.3 g/dL 6.8 8.0 8.0  Total Bilirubin 0.3 - 1.2 mg/dL 0.4 - -  Alkaline Phos 39 - 117 units/L 75 87 87  AST 0 - 37 units/L 32 55 55  ALT 0 - 35 units/L 45(H) 52 52       DIAGNOSTIC IMAGING:  I have independently reviewed the scans and discussed with the patient.     ASSESSMENT & PLAN:   CARCINOMA IN SITU, RIGHT BREAST 1.  Right breast DCIS: -Diagnosed on 09/25/2008 after right breast biopsy. - Right breast lumpectomy and lymph node biopsy on 10/24/2008.  Pathology shows high-grade DCIS, negative margins, 1.9 cm, 0/4 lymph nodes positive, ER 99% positive, PR 59% positive, free margins.  pTis, PN0. - Patient was given options of tamoxifen and Evista.  She chose to proceed with every step.  She has been on it since then. -Mammogram on 04/02/2019 was BI-RADS Category 0 with additional evaluation of the right breast recommended. - Ultrasound on 04/10/2019 shows enlarged right axillary lymph node with abnormal morphology, measuring 1.1 cm. - Ultrasound-guided core biopsy of the right axillary lymph node on 04/17/2019 shows reactive lymph node with sinus histiocytosis with no carcinoma identified. - Physical examination today did not show any palpable adenopathy.  Vague tenderness at the site of biopsy present.  Lumpectomy site is within normal limits. - I had a prolonged discussion with the patient about  the biopsy results.  I have recommended consultation with Dr. Arnoldo Morale for possible removal of the lymph node.  She is agreeable.  We made a appointment. -I will see her back 3 to 4 weeks after surgery.  I have told her that she may discontinue Evista as there is no data supporting use beyond 5 to 7 years.  2.  Osteopenia: -DEXA scan on 07/17/2018 shows T score of -1.4.  This is stable compared to DEXA scan in 2017. - I have reviewed lab reports which showed  normal calcium at 9.4.  3.  Elevated liver enzymes: -She had elevated AST and ALT, mild at 61 and 36 respectively.  She had this elevation last year also. -I have reviewed CT abdomen from 2011 which showed mild diffuse fatty infiltration of the liver, which could be contributing to this enzyme elevation.  Total time spent is 40 minutes with more than 50% of the time spent face-to-face discussing scan results, biopsy results, further plan, counseling and coordination of care.    Orders placed this encounter:  No orders of the defined types were placed in this encounter.     Derek Jack, MD [place] [phone]

## 2019-04-24 NOTE — Patient Instructions (Addendum)
[facility] Discharge Instructions  You were seen today by Dr. Delton Coombes. He went over your recent lab results. He will refer you to Dr. Arnoldo Morale. He will see you back after surgery for follow up.   Thank you for choosing [facility] to provide your oncology and hematology care.  To afford each patient quality time with our provider, please arrive at least 15 minutes before your scheduled appointment time.   If you have a lab appointment with the [facility] please come in thru the  Main Entrance and check in at the main information desk  You need to re-schedule your appointment should you arrive 10 or more minutes late.  We strive to give you quality time with our providers, and arriving late affects you and other patients whose appointments are after yours.  Also, if you no show three or more times for appointments you may be dismissed from the clinic at the providers discretion.     Again, thank you for choosing [facility].  Our hope is that these requests will decrease the amount of time that you wait before being seen by our physicians.       _____________________________________________________________  Should you have questions after your visit to [facility], please contact our office at [phone] between the hours of 8:00 a.m. and 4:30 p.m.  Voicemails left after 4:00 p.m. will not be returned until the following business day.  For prescription refill requests, have your pharmacy contact our office and allow 72 hours.    Cancer Center Support Programs:   > Cancer Support Group  2nd Tuesday of the month 1pm-2pm, Journey Room

## 2019-04-25 DIAGNOSIS — M25551 Pain in right hip: Secondary | ICD-10-CM | POA: Diagnosis not present

## 2019-04-25 DIAGNOSIS — M8580 Other specified disorders of bone density and structure, unspecified site: Secondary | ICD-10-CM | POA: Diagnosis not present

## 2019-04-25 DIAGNOSIS — R945 Abnormal results of liver function studies: Secondary | ICD-10-CM | POA: Diagnosis not present

## 2019-04-25 DIAGNOSIS — Z6829 Body mass index (BMI) 29.0-29.9, adult: Secondary | ICD-10-CM | POA: Diagnosis not present

## 2019-04-25 DIAGNOSIS — L4059 Other psoriatic arthropathy: Secondary | ICD-10-CM | POA: Diagnosis not present

## 2019-04-25 DIAGNOSIS — R220 Localized swelling, mass and lump, head: Secondary | ICD-10-CM | POA: Diagnosis not present

## 2019-04-25 DIAGNOSIS — E663 Overweight: Secondary | ICD-10-CM | POA: Diagnosis not present

## 2019-04-25 DIAGNOSIS — M5030 Other cervical disc degeneration, unspecified cervical region: Secondary | ICD-10-CM | POA: Diagnosis not present

## 2019-04-25 DIAGNOSIS — M25561 Pain in right knee: Secondary | ICD-10-CM | POA: Diagnosis not present

## 2019-05-10 ENCOUNTER — Ambulatory Visit (INDEPENDENT_AMBULATORY_CARE_PROVIDER_SITE_OTHER): Payer: Medicare HMO | Admitting: General Surgery

## 2019-05-10 ENCOUNTER — Encounter: Payer: Self-pay | Admitting: General Surgery

## 2019-05-10 ENCOUNTER — Other Ambulatory Visit: Payer: Self-pay

## 2019-05-10 VITALS — BP 136/76 | HR 67 | Temp 97.1°F | Resp 16 | Ht 65.0 in | Wt 175.0 lb

## 2019-05-10 DIAGNOSIS — Z23 Encounter for immunization: Secondary | ICD-10-CM | POA: Diagnosis not present

## 2019-05-10 DIAGNOSIS — R599 Enlarged lymph nodes, unspecified: Secondary | ICD-10-CM

## 2019-05-10 NOTE — Progress Notes (Signed)
Maria Ritter; [id number]; 1946-03-04   HPI Patient is a 73 year old white female who was referred to me care by Dr. Nevada Crane and Dr. Delton Coombes for a right axillary lymph node biopsy.  Patient has a history of ductal carcinoma in situ of the right breast in the remote past that was treated with surgery, lymph node biopsy, and antiestrogen therapy.  This was approximately 10 years ago.  Recently she was found to have a enlarged lymph node in the right axilla.  Needle core biopsy showed a reactive lymph node.  Due to her history of DCIS, it is felt that by removing the full lymph node to fully assess, oncology may be able to stop her antiestrogen therapy.  Patient currently has 0 out of 10 pain in the right axilla. Past Medical History:  Diagnosis Date  . Breast cancer ([place]) 2010   Right Lumpectomy and XRT  . Carotid artery disease ([facility])    Mild to moderate bilateral ICA disease, left greater than right 07/2015  . Essential hypertension   . GERD (gastroesophageal reflux disease)   . Glaucoma   . Hyperlipidemia   . Hypothyroidism   . Osteoarthritis   . Polymyalgia rheumatica ([facility])     Past Surgical History:  Procedure Laterality Date  . BASAL CELL CARCINOMA EXCISION  2010   Right breast  . BREAST LUMPECTOMY Right 2010  . ENDOMETRIAL ABLATION  2002  . FINGER SURGERY     Minor surgical intervention following a crush injury to the left fourth finger  . TOOTH EXTRACTION  01/2019    Family History  Problem Relation Age of Onset  . Pneumonia Mother   . Parkinsonism Mother   . Heart failure Father     Current Outpatient Medications on File Prior to Visit  Medication Sig Dispense Refill  . amLODipine (NORVASC) 5 MG tablet Take 5 mg by mouth daily.      Marland Kitchen aspirin 81 MG tablet Take 81 mg by mouth daily.      Marland Kitchen CALCIUM CITRATE-VITAMIN D PO Take 1 tablet by mouth daily. 630 mg Calcium 500 mg Vitamin D    . Cholecalciferol (VITAMIN D3) 2000 units TABS Take by mouth 2 (two) times daily.     .  diphenhydrAMINE (BENADRYL) 25 MG tablet Take 25 mg by mouth every 6 (six) hours as needed.    . fexofenadine (ALLEGRA) 180 MG tablet Take 1 tablet (180 mg total) by mouth daily as needed for allergies or rhinitis. 30 tablet 5  . GLUCOSAMINE-CHONDROITIN PO Take 1 tablet by mouth 2 (two) times daily. Glucosamine 1500 mg Chondroitin 1200 mg    . hydrocortisone cream 1 % Apply 1 application topically as needed for itching.    Marland Kitchen ibuprofen (ADVIL,MOTRIN) 200 MG tablet Take 200 mg by mouth every 6 (six) hours as needed.    Marland Kitchen levothyroxine (SYNTHROID, LEVOTHROID) 137 MCG tablet Take 137 mcg by mouth daily.  1  . lisinopril (PRINIVIL,ZESTRIL) 10 MG tablet Take 10 mg by mouth daily.    . Magnesium 250 MG TABS Take 1 tablet by mouth daily.    . methocarbamol (ROBAXIN) 500 MG tablet TAKE 1 TABLET BY MOUTH THREE TIMES DAILY AS NEEDED FOR SPASMS    . metroNIDAZOLE (METROGEL) 0.75 % gel Apply 1 application topically 2 (two) times daily. Apply to cheeks as needed for redness    . Omega-3 1000 MG CAPS Take 1,000 mg by mouth 2 (two) times daily.    Vladimir Faster Glycol-Propyl Glycol (SYSTANE OP) Apply  1 drop to eye 2 (two) times daily as needed. Systane Complete eye drops    . Pramoxine-Benzyl Alcohol (ITCH-X EX) Apply topically as needed. ITCH-X    . raloxifene (EVISTA) 60 MG tablet Take 60 mg by mouth daily.      . sodium chloride (OCEAN) 0.65 % SOLN nasal spray Place 1 spray into both nostrils as needed for congestion.    . triamcinolone cream (KENALOG) 0.1 % Apply 1 application topically 2 (two) times daily. Apply under chin for itching    . Turmeric 500 MG CAPS Take 500 mg by mouth daily.    . vitamin B-12 (CYANOCOBALAMIN) 500 MCG tablet Take 1,000 mcg by mouth daily.      No current facility-administered medications on file prior to visit.     Allergies  Allergen Reactions  . Mango Flavor Hives  . Erythromycin   . Ivp Dye [Iodinated Diagnostic Agents]   . Penicillins   . Sulfonamide Derivatives   .  Neosporin [Bacitracin-Polymyxin B] Itching    Social History   Substance and Sexual Activity  Alcohol Use No  . Alcohol/week: 0.0 standard drinks    Social History   Tobacco Use  Smoking Status Never Smoker  Smokeless Tobacco Never Used    Review of Systems  Constitutional: Negative.   HENT: Positive for sinus pain.   Eyes: Negative.   Respiratory: Negative.   Cardiovascular: Negative.   Gastrointestinal: Negative.   Genitourinary: Negative.   Musculoskeletal: Negative.   Skin: Positive for rash.  Neurological: Negative.   Endo/Heme/Allergies: Negative.   Psychiatric/Behavioral: Negative.     Objective   Vitals:   05/10/19 1107  BP: 136/76  Pulse: 67  Resp: 16  Temp: (!) 97.1 F (36.2 C)  SpO2: 95%    Physical Exam Vitals signs reviewed.  Constitutional:      Appearance: Normal appearance. She is not ill-appearing.  HENT:     Head: Normocephalic and atraumatic.  Cardiovascular:     Rate and Rhythm: Normal rate and regular rhythm.     Heart sounds: Normal heart sounds. No murmur. No friction rub. No gallop.   Pulmonary:     Effort: Pulmonary effort is normal. No respiratory distress.     Breath sounds: Normal breath sounds. No stridor. No wheezing, rhonchi or rales.  Skin:    General: Skin is warm and dry.  Neurological:     Mental Status: She is alert and oriented to person, place, and time.   Breast: Limited examination was performed per patient request.  The right axilla was palpated and it was difficult to a certain any specific dominant axillary lymph node.  I was able to faintly palpate deep in the right axilla a palpable lymph node which may be the one of concern.  Biopsy report and ultrasound reports reviewed  Assessment  Right axillary lymphadenopathy, remote history of ductal carcinoma in situ of the right breast Plan   We will proceed with a right axillary lymph node biopsy on 05/21/2019.  The risks and benefits of the procedure including  bleeding, infection, and the possibility of a negative biopsy were fully explained to the patient, who gave informed consent.

## 2019-05-11 NOTE — H&P (Signed)
Maria Ritter; [id number]; 1945-09-02   HPI Patient is a 73 year old white female who was referred to me care by Dr. Nevada Crane and Dr. Delton Coombes for a right axillary lymph node biopsy.  Patient has a history of ductal carcinoma in situ of the right breast in the remote past that was treated with surgery, lymph node biopsy, and antiestrogen therapy.  This was approximately 10 years ago.  Recently she was found to have a enlarged lymph node in the right axilla.  Needle core biopsy showed a reactive lymph node.  Due to her history of DCIS, it is felt that by removing the full lymph node to fully assess, oncology may be able to stop her antiestrogen therapy.  Patient currently has 0 out of 10 pain in the right axilla. Past Medical History:  Diagnosis Date  . Breast cancer ([place]) 2010   Right Lumpectomy and XRT  . Carotid artery disease ([facility])    Mild to moderate bilateral ICA disease, left greater than right 07/2015  . Essential hypertension   . GERD (gastroesophageal reflux disease)   . Glaucoma   . Hyperlipidemia   . Hypothyroidism   . Osteoarthritis   . Polymyalgia rheumatica ([facility])     Past Surgical History:  Procedure Laterality Date  . BASAL CELL CARCINOMA EXCISION  2010   Right breast  . BREAST LUMPECTOMY Right 2010  . ENDOMETRIAL ABLATION  2002  . FINGER SURGERY     Minor surgical intervention following a crush injury to the left fourth finger  . TOOTH EXTRACTION  01/2019    Family History  Problem Relation Age of Onset  . Pneumonia Mother   . Parkinsonism Mother   . Heart failure Father     Current Outpatient Medications on File Prior to Visit  Medication Sig Dispense Refill  . amLODipine (NORVASC) 5 MG tablet Take 5 mg by mouth daily.      Marland Kitchen aspirin 81 MG tablet Take 81 mg by mouth daily.      Marland Kitchen CALCIUM CITRATE-VITAMIN D PO Take 1 tablet by mouth daily. 630 mg Calcium 500 mg Vitamin D    . Cholecalciferol (VITAMIN D3) 2000 units TABS Take by mouth 2 (two) times daily.     .  diphenhydrAMINE (BENADRYL) 25 MG tablet Take 25 mg by mouth every 6 (six) hours as needed.    . fexofenadine (ALLEGRA) 180 MG tablet Take 1 tablet (180 mg total) by mouth daily as needed for allergies or rhinitis. 30 tablet 5  . GLUCOSAMINE-CHONDROITIN PO Take 1 tablet by mouth 2 (two) times daily. Glucosamine 1500 mg Chondroitin 1200 mg    . hydrocortisone cream 1 % Apply 1 application topically as needed for itching.    Marland Kitchen ibuprofen (ADVIL,MOTRIN) 200 MG tablet Take 200 mg by mouth every 6 (six) hours as needed.    Marland Kitchen levothyroxine (SYNTHROID, LEVOTHROID) 137 MCG tablet Take 137 mcg by mouth daily.  1  . lisinopril (PRINIVIL,ZESTRIL) 10 MG tablet Take 10 mg by mouth daily.    . Magnesium 250 MG TABS Take 1 tablet by mouth daily.    . methocarbamol (ROBAXIN) 500 MG tablet TAKE 1 TABLET BY MOUTH THREE TIMES DAILY AS NEEDED FOR SPASMS    . metroNIDAZOLE (METROGEL) 0.75 % gel Apply 1 application topically 2 (two) times daily. Apply to cheeks as needed for redness    . Omega-3 1000 MG CAPS Take 1,000 mg by mouth 2 (two) times daily.    Vladimir Faster Glycol-Propyl Glycol (SYSTANE OP) Apply  1 drop to eye 2 (two) times daily as needed. Systane Complete eye drops    . Pramoxine-Benzyl Alcohol (ITCH-X EX) Apply topically as needed. ITCH-X    . raloxifene (EVISTA) 60 MG tablet Take 60 mg by mouth daily.      . sodium chloride (OCEAN) 0.65 % SOLN nasal spray Place 1 spray into both nostrils as needed for congestion.    . triamcinolone cream (KENALOG) 0.1 % Apply 1 application topically 2 (two) times daily. Apply under chin for itching    . Turmeric 500 MG CAPS Take 500 mg by mouth daily.    . vitamin B-12 (CYANOCOBALAMIN) 500 MCG tablet Take 1,000 mcg by mouth daily.      No current facility-administered medications on file prior to visit.     Allergies  Allergen Reactions  . Mango Flavor Hives  . Erythromycin   . Ivp Dye [Iodinated Diagnostic Agents]   . Penicillins   . Sulfonamide Derivatives   .  Neosporin [Bacitracin-Polymyxin B] Itching    Social History   Substance and Sexual Activity  Alcohol Use No  . Alcohol/week: 0.0 standard drinks    Social History   Tobacco Use  Smoking Status Never Smoker  Smokeless Tobacco Never Used    Review of Systems  Constitutional: Negative.   HENT: Positive for sinus pain.   Eyes: Negative.   Respiratory: Negative.   Cardiovascular: Negative.   Gastrointestinal: Negative.   Genitourinary: Negative.   Musculoskeletal: Negative.   Skin: Positive for rash.  Neurological: Negative.   Endo/Heme/Allergies: Negative.   Psychiatric/Behavioral: Negative.     Objective   Vitals:   05/10/19 1107  BP: 136/76  Pulse: 67  Resp: 16  Temp: (!) 97.1 F (36.2 C)  SpO2: 95%    Physical Exam Vitals signs reviewed.  Constitutional:      Appearance: Normal appearance. She is not ill-appearing.  HENT:     Head: Normocephalic and atraumatic.  Cardiovascular:     Rate and Rhythm: Normal rate and regular rhythm.     Heart sounds: Normal heart sounds. No murmur. No friction rub. No gallop.   Pulmonary:     Effort: Pulmonary effort is normal. No respiratory distress.     Breath sounds: Normal breath sounds. No stridor. No wheezing, rhonchi or rales.  Skin:    General: Skin is warm and dry.  Neurological:     Mental Status: She is alert and oriented to person, place, and time.   Breast: Limited examination was performed per patient request.  The right axilla was palpated and it was difficult to a certain any specific dominant axillary lymph node.  I was able to faintly palpate deep in the right axilla a palpable lymph node which may be the one of concern.  Biopsy report and ultrasound reports reviewed  Assessment  Right axillary lymphadenopathy, remote history of ductal carcinoma in situ of the right breast Plan   We will proceed with a right axillary lymph node biopsy on 05/21/2019.  The risks and benefits of the procedure including  bleeding, infection, and the possibility of a negative biopsy were fully explained to the patient, who gave informed consent.

## 2019-05-15 NOTE — Patient Instructions (Signed)
Your procedure is scheduled on: 05/21/2019  Report to [facility] at  7:30   AM.  Call this number if you have problems the morning of surgery: [phone]   Remember:   Do not Eat or Drink after midnight   :  Take these medicines the morning of surgery with A SIP OF WATER: Amlodipine, Allegra and Levothyroxine   Do not wear jewelry, make-up or nail polish.  Do not wear lotions, powders, or perfumes. You may wear deodorant.  Do not shave 48 hours prior to surgery. Men may shave face and neck.  Do not bring valuables to the hospital.  Contacts, dentures or bridgework may not be worn into surgery.  Leave suitcase in the car. After surgery it may be brought to your room.  For patients admitted to the hospital, checkout time is 11:00 AM the day of discharge.   Patients discharged the day of surgery will not be allowed to drive home.    Special Instructions: Shower using CHG night before surgery and shower the day of surgery use CHG.  Use special wash - you have one bottle of CHG for all showers.  You should use approximately 1/2 of the bottle for each shower.  Axillary Lymph Node Dissection, Care After This sheet gives you information about how to care for yourself after your procedure. Your health care provider may also give you more specific instructions. If you have problems or questions, contact your health care provider. What can I expect after the procedure? After the procedure, it is common to have: Pain and soreness around your incision area. Trouble moving your arm or shoulder. A small amount of swelling in your arm. Numbness on the upper and inside parts of your arm. Follow these instructions at home: Medicines Take over-the-counter and prescription medicines only as told by your health care provider. If you were prescribed an antibiotic medicine, take it as told by your health care provider. Do not stop taking the antibiotic even if you start to feel better. Incision  care  Follow instructions from your health care provider about how to take care of your incision. Make sure you: Wash your hands with soap and water before you change your bandage (dressing). If soap and water are not available, use hand sanitizer. Change your dressing as told by your health care provider. Leave stitches (sutures), skin glue, or adhesive strips in place. These skin closures may need to stay in place for 2 weeks or longer. If adhesive strip edges start to loosen and curl up, you may trim the loose edges. Do not remove adhesive strips completely unless your health care provider tells you to do that. Check your incision area every day for signs of infection. Check for: Redness, swelling, or pain. Fluid or blood. Warmth. Pus or a bad smell. Activity Do arm and shoulder exercises as told by your health care provider. This may prevent movement problems and stiffness. Return to your normal activities as told by your health care provider. Ask your health care provider what activities are safe for you. Avoid any activities that cause pain. Driving Do not drive for 24 hours if you were given a medicine to help you relax (sedative). Do not drive or use heavy machinery while taking prescription pain medicine. General instructions If a drainage tube was left in your breast, care for it as told by your health care provider. The drain may stay in place for up to 7-10 days. Wear a compression garment on your  arm as told by your health care provider. This may help to prevent blood clots and reduce swelling in your arm. Do not use any products that contain nicotine or tobacco, such as cigarettes and e-cigarettes. If you need help quitting, ask your health care provider. Do not take baths, swim, or use a hot tub until your health care provider approves. Ask your health care provider if you may take showers. You may only be allowed to take sponge baths for bathing. Do not have your blood pressure  taken, have blood drawn, or get injections or IVs in the arm on the side where your lymph nodes were removed. Follow instructions from your health care provider about how often you should be screened for extra fluid around your lymph nodes (lymphedema). Keep all follow-up visits as told by your health care provider. This is important. Contact a health care provider if: Your arm is swollen, tight, and painful. You have redness, swelling, or pain around your incision. You have fluid or blood coming from your incision. Your incision feels warm to the touch. You have pus or a bad smell coming from your incision. Get help right away if: You have severe pain that does not get better with medicine. You have a fever or chills. You are confused. You have chest pain. You have shortness of breath. Summary After the procedure, it is common to have pain and soreness and trouble moving your arm or shoulder. A small amount of arm swelling is normal after the procedure. However, you should contact your health care provider if your arm is swollen, tight, and painful. Wear a compression garment on your arm as told by your health care provider. This may help to prevent blood clots and reduce swelling in your arm. Do arm and shoulder exercises as directed to help prevent movement problems and stiffness. If a drainage tube was left in your breast, care for it as told by your health care provider. This information is not intended to replace advice given to you by your health care provider. Make sure you discuss any questions you have with your health care provider. Document Released: 10/13/2016 Document Revised: 12/05/2018 Document Reviewed: 10/13/2016 Elsevier Patient Education  2020 [place].  Axillary Lymph Node Dissection  Axillary lymph node dissection is a procedure to remove (dissect) lymph nodes that are located around the armpit (axilla). Lymph nodes are collections of tissue that filter bacteria,  viruses, and waste from the bloodstream. They are part of the body's disease-fighting system (immune system). The axillary lymph nodes drain fluid (lymph) from the breast and surrounding areas. Lymph nodes will be removed from the area around the muscle that is between the breast and shoulder (pectoralis minor muscle). Lymph nodes below the lower edge of the muscle (level I lymph nodes) and underneath the muscle (level II) are usually removed. If cancer has spread to the level II lymph nodes, some lymph nodes above the muscle (level III) may also be removed. This surgery may be done to check whether cancer has spread (staging). The procedure may be done at the same time as a procedure to remove a breast tumor (lumpectomy) or a procedure to remove breast tissue (mastectomy). Tell a health care provider about:  Any allergies you have.  All medicines you are taking, including vitamins, herbs, eye drops, creams, and over-the-counter medicines.  Any problems you or family members have had with anesthetic medicines.  Any blood disorders you have.  Any surgeries you have had.  Any  medical conditions you have.  Whether you are pregnant or may be pregnant. What are the risks? Generally, this is a safe procedure. However, problems may occur, including:  Infection.  Bleeding.  Allergic reactions to medicines.  Damage to other structures or organs.  Pain.  Numbness in the arm.  Swelling in the arm (lymphedema).  Blood clots. What happens before the procedure? Medicines  Ask your health care provider about: ? Changing or stopping your regular medicines. This is especially important if you are taking diabetes medicines or blood thinners. ? Taking over-the-counter medicines, vitamins, herbs, and supplements. ? Taking medicines such as aspirin and ibuprofen. These medicines can thin your blood. Do not take these medicines unless your health care provider tells you to take them.  You may  be given antibiotic medicine to help prevent infection. Staying hydrated Follow instructions from your health care provider about hydration, which may include:  Up to 2 hours before the procedure - you may continue to drink clear liquids, such as water, clear fruit juice, black coffee, and plain tea. Eating and drinking restrictions Follow instructions from your health care provider about eating and drinking, which may include:  8 hours before the procedure - stop eating heavy meals or foods such as meat, fried foods, or fatty foods.  6 hours before the procedure - stop eating light meals or foods, such as toast or cereal.  6 hours before the procedure - stop drinking milk or drinks that contain milk.  2 hours before the procedure - stop drinking clear liquids. General instructions  Ask your health care provider how your surgical site will be marked or identified.  Plan to have someone take you home from the hospital or clinic.  Plan to have a responsible adult care for you for at least 24 hours after you leave the hospital or clinic. This is important. What happens during the procedure?  To lower your risk of infection: ? Your health care team will wash or sanitize their hands. ? Your skin will be washed with soap. ? Hair may be removed from the surgical area.  An IV will be inserted into one of your veins.  You will be given one or more of the following: ? A medicine to help you relax (sedative). ? A medicine to numb the area (local anesthetic). ? A medicine to make you fall asleep (general anesthetic).  You will be placed on your back. Your arm will be straight out from the side of your body, resting on a support.  An incision will be made on the side of your armpit, next to your breast.  Tissue and muscle will be moved out of the way to allow access to the lymph nodes.  Level I and level II axillary lymph nodes will be removed. This total may be 10-40 lymph nodes. If  necessary, level III lymph nodes will also be removed.  A drainage tube may be placed in the side of your breast to help drain fluid from the surgical area.  Your incision will be closed with stitches (sutures) and covered with a bandage (dressing). The procedure may vary among health care providers and hospitals. What happens after the procedure?  Your blood pressure, heart rate, breathing rate, and blood oxygen level will be monitored until the medicines you were given have worn off.  Do not drive for 24 hours if you were given a sedative.  You might be asked to wear a compression garment on your arm. This  garment helps to prevent blood clots and reduce swelling in your arm.  If you have a drainage tube, it may be left in place for up to 7-10 days.  It is up to you to get the results of your procedure. Ask your health care provider, or the department that is doing the procedure, when your results will be ready. Summary  An axillary lymph node is a lymph node that is located around the armpit (axilla).  An axillary lymph node dissection is surgery to remove many of the axillary lymph nodes.  During this procedure, lymph nodes are removed from the area around the muscle that is between your breast and shoulder (pectoralis minor muscle).  After the procedure, you may need to wear a compression garment on your arm to prevent blood clots and reduce swelling.  You may have a drainage tube placed in your breast during the surgery. It may be left in for 7-10 days. This information is not intended to replace advice given to you by your health care provider. Make sure you discuss any questions you have with your health care provider. Document Released: 10/13/2016 Document Revised: 12/05/2018 Document Reviewed: 10/13/2016 Elsevier Patient Education  2020 [place] Anesthesia, Adult, Care After This sheet gives you information about how to care for yourself after your procedure.  Your health care provider may also give you more specific instructions. If you have problems or questions, contact your health care provider. What can I expect after the procedure? After the procedure, the following side effects are common:  Pain or discomfort at the IV site.  Nausea.  Vomiting.  Sore throat.  Trouble concentrating.  Feeling cold or chills.  Weak or tired.  Sleepiness and fatigue.  Soreness and body aches. These side effects can affect parts of the body that were not involved in surgery. Follow these instructions at home:  For at least 24 hours after the procedure:  Have a responsible adult stay with you. It is important to have someone help care for you until you are awake and alert.  Rest as needed.  Do not: ? Participate in activities in which you could fall or become injured. ? Drive. ? Use heavy machinery. ? Drink alcohol. ? Take sleeping pills or medicines that cause drowsiness. ? Make important decisions or sign legal documents. ? Take care of children on your own. Eating and drinking  Follow any instructions from your health care provider about eating or drinking restrictions.  When you feel hungry, start by eating small amounts of foods that are soft and easy to digest (bland), such as toast. Gradually return to your regular diet.  Drink enough fluid to keep your urine pale yellow.  If you vomit, rehydrate by drinking water, juice, or clear broth. General instructions  If you have sleep apnea, surgery and certain medicines can increase your risk for breathing problems. Follow instructions from your health care provider about wearing your sleep device: ? Anytime you are sleeping, including during daytime naps. ? While taking prescription pain medicines, sleeping medicines, or medicines that make you drowsy.  Return to your normal activities as told by your health care provider. Ask your health care provider what activities are safe for  you.  Take over-the-counter and prescription medicines only as told by your health care provider.  If you smoke, do not smoke without supervision.  Keep all follow-up visits as told by your health care provider. This is important. Contact a health care provider if:  You have nausea or vomiting that does not get better with medicine.  You cannot eat or drink without vomiting.  You have pain that does not get better with medicine.  You are unable to pass urine.  You develop a skin rash.  You have a fever.  You have redness around your IV site that gets worse. Get help right away if:  You have difficulty breathing.  You have chest pain.  You have blood in your urine or stool, or you vomit blood. Summary  After the procedure, it is common to have a sore throat or nausea. It is also common to feel tired.  Have a responsible adult stay with you for the first 24 hours after general anesthesia. It is important to have someone help care for you until you are awake and alert.  When you feel hungry, start by eating small amounts of foods that are soft and easy to digest (bland), such as toast. Gradually return to your regular diet.  Drink enough fluid to keep your urine pale yellow.  Return to your normal activities as told by your health care provider. Ask your health care provider what activities are safe for you. This information is not intended to replace advice given to you by your health care provider. Make sure you discuss any questions you have with your health care provider. Document Released: 11/22/2000 Document Revised: 08/19/2017 Document Reviewed: 04/01/2017 Elsevier Patient Education  2020 [employer].

## 2019-05-17 ENCOUNTER — Encounter (HOSPITAL_COMMUNITY): Payer: Self-pay

## 2019-05-17 ENCOUNTER — Other Ambulatory Visit: Payer: Self-pay

## 2019-05-17 ENCOUNTER — Encounter (HOSPITAL_COMMUNITY)
Admission: RE | Admit: 2019-05-17 | Discharge: 2019-05-17 | Disposition: A | Discharge status: Home / Self Care | Payer: Medicare HMO | Source: Ambulatory Visit | Attending: General Surgery | Admitting: General Surgery

## 2019-05-17 ENCOUNTER — Other Ambulatory Visit (HOSPITAL_COMMUNITY): Payer: Medicare HMO

## 2019-05-17 ENCOUNTER — Other Ambulatory Visit (HOSPITAL_COMMUNITY)
Admission: RE | Admit: 2019-05-17 | Discharge: 2019-05-17 | Disposition: A | Discharge status: Home / Self Care | Payer: Medicare HMO | Source: Ambulatory Visit | Attending: General Surgery | Admitting: General Surgery

## 2019-05-17 ENCOUNTER — Other Ambulatory Visit (INDEPENDENT_AMBULATORY_CARE_PROVIDER_SITE_OTHER): Payer: Self-pay | Admitting: General Surgery

## 2019-05-17 DIAGNOSIS — I2510 Atherosclerotic heart disease of native coronary artery without angina pectoris: Secondary | ICD-10-CM | POA: Diagnosis not present

## 2019-05-17 DIAGNOSIS — Z20828 Contact with and (suspected) exposure to other viral communicable diseases: Secondary | ICD-10-CM | POA: Insufficient documentation

## 2019-05-17 DIAGNOSIS — R2231 Localized swelling, mass and lump, right upper limb: Secondary | ICD-10-CM

## 2019-05-17 DIAGNOSIS — K219 Gastro-esophageal reflux disease without esophagitis: Secondary | ICD-10-CM | POA: Diagnosis not present

## 2019-05-17 DIAGNOSIS — E782 Mixed hyperlipidemia: Secondary | ICD-10-CM | POA: Diagnosis not present

## 2019-05-17 DIAGNOSIS — R7301 Impaired fasting glucose: Secondary | ICD-10-CM | POA: Diagnosis not present

## 2019-05-17 DIAGNOSIS — R9431 Abnormal electrocardiogram [ECG] [EKG]: Secondary | ICD-10-CM | POA: Diagnosis not present

## 2019-05-17 DIAGNOSIS — Z01818 Encounter for other preprocedural examination: Secondary | ICD-10-CM | POA: Diagnosis present

## 2019-05-17 DIAGNOSIS — E039 Hypothyroidism, unspecified: Secondary | ICD-10-CM | POA: Diagnosis not present

## 2019-05-17 DIAGNOSIS — I10 Essential (primary) hypertension: Secondary | ICD-10-CM | POA: Diagnosis not present

## 2019-05-17 LAB — CBC WITH DIFFERENTIAL/PLATELET
Abs Immature Granulocytes: 0.02 10*3/uL (ref 0.00–0.07)
Basophils Absolute: 0.1 10*3/uL (ref 0.0–0.1)
Basophils Relative: 1 %
Eosinophils Absolute: 0.3 10*3/uL (ref 0.0–0.5)
Eosinophils Relative: 4 %
HCT: 42.2 % (ref 36.0–46.0)
Hemoglobin: 13.1 g/dL (ref 12.0–15.0)
Immature Granulocytes: 0 %
Lymphocytes Relative: 31 %
Lymphs Abs: 2 10*3/uL (ref 0.7–4.0)
MCH: 29.4 pg (ref 26.0–34.0)
MCHC: 31 g/dL (ref 30.0–36.0)
MCV: 94.8 fL (ref 80.0–100.0)
Monocytes Absolute: 0.9 10*3/uL (ref 0.1–1.0)
Monocytes Relative: 13 %
Neutro Abs: 3.2 10*3/uL (ref 1.7–7.7)
Neutrophils Relative %: 51 %
Platelets: 254 10*3/uL (ref 150–400)
RBC: 4.45 MIL/uL (ref 3.87–5.11)
RDW: 13.3 % (ref 11.5–15.5)
WBC: 6.5 10*3/uL (ref 4.0–10.5)
nRBC: 0 % (ref 0.0–0.2)

## 2019-05-17 LAB — BASIC METABOLIC PANEL
Anion gap: 9 (ref 5–15)
BUN: 21 mg/dL (ref 8–23)
CO2: 28 mmol/L (ref 22–32)
Calcium: 9 mg/dL (ref 8.9–10.3)
Chloride: 100 mmol/L (ref 98–111)
Creatinine, Ser: 0.76 mg/dL (ref 0.44–1.00)
GFR calc Af Amer: >60 mL/min (ref >60)
GFR calc non Af Amer: >60 mL/min (ref >60)
Glucose, Bld: 85 mg/dL (ref 70–99)
Potassium: 4 mmol/L (ref 3.5–5.1)
Sodium: 137 mmol/L (ref 135–145)

## 2019-05-17 LAB — SARS CORONAVIRUS 2 (TAT 6-24 HRS): SARS Coronavirus 2: NEGATIVE

## 2019-05-17 MED ORDER — TRAMADOL HCL 50 MG PO TABS: 50.0000 mg | ORAL_TABLET | Freq: Four times a day (QID) | ORAL | 0 refills | Status: DC | PRN

## 2019-05-21 ENCOUNTER — Other Ambulatory Visit: Payer: Self-pay

## 2019-05-21 ENCOUNTER — Ambulatory Visit (HOSPITAL_COMMUNITY): Payer: Medicare HMO

## 2019-05-21 ENCOUNTER — Encounter (HOSPITAL_COMMUNITY): Payer: Self-pay | Admitting: Anesthesiology

## 2019-05-21 ENCOUNTER — Ambulatory Visit (HOSPITAL_COMMUNITY): Payer: Medicare HMO | Admitting: Anesthesiology

## 2019-05-21 ENCOUNTER — Ambulatory Visit (HOSPITAL_COMMUNITY)
Admission: RE | Admit: 2019-05-21 | Discharge: 2019-05-21 | Disposition: A | Discharge status: Home / Self Care | Payer: Medicare HMO | Attending: General Surgery | Admitting: General Surgery

## 2019-05-21 ENCOUNTER — Encounter (HOSPITAL_COMMUNITY)
Admission: RE | Disposition: A | Discharge status: Home / Self Care | Payer: Self-pay | Source: Home / Self Care | Attending: General Surgery

## 2019-05-21 DIAGNOSIS — E039 Hypothyroidism, unspecified: Secondary | ICD-10-CM | POA: Insufficient documentation

## 2019-05-21 DIAGNOSIS — D763 Other histiocytosis syndromes: Secondary | ICD-10-CM | POA: Diagnosis not present

## 2019-05-21 DIAGNOSIS — R599 Enlarged lymph nodes, unspecified: Secondary | ICD-10-CM

## 2019-05-21 DIAGNOSIS — R590 Localized enlarged lymph nodes: Secondary | ICD-10-CM | POA: Insufficient documentation

## 2019-05-21 DIAGNOSIS — Z85828 Personal history of other malignant neoplasm of skin: Secondary | ICD-10-CM | POA: Insufficient documentation

## 2019-05-21 DIAGNOSIS — Z79810 Long term (current) use of selective estrogen receptor modulators (SERMs): Secondary | ICD-10-CM | POA: Insufficient documentation

## 2019-05-21 DIAGNOSIS — K219 Gastro-esophageal reflux disease without esophagitis: Secondary | ICD-10-CM | POA: Insufficient documentation

## 2019-05-21 DIAGNOSIS — Z853 Personal history of malignant neoplasm of breast: Secondary | ICD-10-CM | POA: Diagnosis not present

## 2019-05-21 DIAGNOSIS — Z79899 Other long term (current) drug therapy: Secondary | ICD-10-CM | POA: Diagnosis not present

## 2019-05-21 DIAGNOSIS — L928 Other granulomatous disorders of the skin and subcutaneous tissue: Secondary | ICD-10-CM | POA: Insufficient documentation

## 2019-05-21 DIAGNOSIS — R928 Other abnormal and inconclusive findings on diagnostic imaging of breast: Secondary | ICD-10-CM

## 2019-05-21 DIAGNOSIS — Z7982 Long term (current) use of aspirin: Secondary | ICD-10-CM | POA: Insufficient documentation

## 2019-05-21 DIAGNOSIS — Z86000 Personal history of in-situ neoplasm of breast: Secondary | ICD-10-CM | POA: Diagnosis not present

## 2019-05-21 DIAGNOSIS — Z79890 Hormone replacement therapy (postmenopausal): Secondary | ICD-10-CM | POA: Insufficient documentation

## 2019-05-21 DIAGNOSIS — I10 Essential (primary) hypertension: Secondary | ICD-10-CM | POA: Diagnosis not present

## 2019-05-21 DIAGNOSIS — R591 Generalized enlarged lymph nodes: Secondary | ICD-10-CM | POA: Diagnosis not present

## 2019-05-21 HISTORY — PX: AXILLARY LYMPH NODE BIOPSY: SHX5737

## 2019-05-21 SURGERY — AXILLARY LYMPH NODE BIOPSY
Anesthesia: General | Site: Axilla | Laterality: Right

## 2019-05-21 MED ORDER — FENTANYL CITRATE (PF) 100 MCG/2ML IJ SOLN
INTRAMUSCULAR | Status: DC | PRN
  Administered 2019-05-21: 100 ug via INTRAVENOUS

## 2019-05-21 MED ORDER — SUCCINYLCHOLINE CHLORIDE 200 MG/10ML IV SOSY
PREFILLED_SYRINGE | INTRAVENOUS | Status: AC
  Filled 2019-05-21: qty 10

## 2019-05-21 MED ORDER — HYDROMORPHONE HCL 1 MG/ML IJ SOLN: 0.2500 mg | INTRAMUSCULAR | Status: DC | PRN

## 2019-05-21 MED ORDER — DEXAMETHASONE SODIUM PHOSPHATE 10 MG/ML IJ SOLN
INTRAMUSCULAR | Status: DC | PRN
  Administered 2019-05-21: 10 mg via INTRAVENOUS

## 2019-05-21 MED ORDER — PROMETHAZINE HCL 25 MG/ML IJ SOLN: 6.2500 mg | INTRAMUSCULAR | Status: DC | PRN

## 2019-05-21 MED ORDER — ONDANSETRON HCL 4 MG/2ML IJ SOLN
INTRAMUSCULAR | Status: DC | PRN
  Administered 2019-05-21: 4 mg via INTRAVENOUS

## 2019-05-21 MED ORDER — KETOROLAC TROMETHAMINE 30 MG/ML IJ SOLN
15.0000 mg | Freq: Once | INTRAMUSCULAR | Status: AC
  Administered 2019-05-21: 11:00:00 15 mg via INTRAVENOUS
  Filled 2019-05-21: qty 1

## 2019-05-21 MED ORDER — CHLORHEXIDINE GLUCONATE CLOTH 2 % EX PADS: 6.0000 | MEDICATED_PAD | Freq: Once | CUTANEOUS | Status: DC

## 2019-05-21 MED ORDER — PROPOFOL 10 MG/ML IV BOLUS
INTRAVENOUS | Status: AC
  Filled 2019-05-21: qty 20

## 2019-05-21 MED ORDER — LACTATED RINGERS IV SOLN
INTRAVENOUS | Status: DC
  Administered 2019-05-21: 08:00:00 via INTRAVENOUS

## 2019-05-21 MED ORDER — BUPIVACAINE HCL (PF) 0.5 % IJ SOLN
INTRAMUSCULAR | Status: DC | PRN
  Administered 2019-05-21: 10 mL

## 2019-05-21 MED ORDER — FENTANYL CITRATE (PF) 100 MCG/2ML IJ SOLN
INTRAMUSCULAR | Status: AC
  Filled 2019-05-21: qty 2

## 2019-05-21 MED ORDER — GLYCOPYRROLATE PF 0.2 MG/ML IJ SOSY
PREFILLED_SYRINGE | INTRAMUSCULAR | Status: AC
  Filled 2019-05-21: qty 1

## 2019-05-21 MED ORDER — ONDANSETRON HCL 4 MG/2ML IJ SOLN
INTRAMUSCULAR | Status: AC
  Filled 2019-05-21: qty 2

## 2019-05-21 MED ORDER — DEXAMETHASONE SODIUM PHOSPHATE 10 MG/ML IJ SOLN
INTRAMUSCULAR | Status: AC
  Filled 2019-05-21: qty 1

## 2019-05-21 MED ORDER — LIDOCAINE 2% (20 MG/ML) 5 ML SYRINGE
INTRAMUSCULAR | Status: AC
  Filled 2019-05-21: qty 5

## 2019-05-21 MED ORDER — MIDAZOLAM HCL 2 MG/2ML IJ SOLN: 0.5000 mg | Freq: Once | INTRAMUSCULAR | Status: DC | PRN

## 2019-05-21 MED ORDER — HYDROCODONE-ACETAMINOPHEN 7.5-325 MG PO TABS
1.0000 | ORAL_TABLET | Freq: Once | ORAL | Status: AC | PRN
  Administered 2019-05-21: 1 via ORAL
  Filled 2019-05-21: qty 1

## 2019-05-21 MED ORDER — PROPOFOL 10 MG/ML IV BOLUS
INTRAVENOUS | Status: DC | PRN
  Administered 2019-05-21: 200 mg via INTRAVENOUS
  Administered 2019-05-21 (×2): 20 mg via INTRAVENOUS

## 2019-05-21 MED ORDER — BUPIVACAINE HCL (PF) 0.5 % IJ SOLN
INTRAMUSCULAR | Status: AC
  Filled 2019-05-21: qty 30

## 2019-05-21 MED ORDER — 0.9 % SODIUM CHLORIDE (POUR BTL) OPTIME
TOPICAL | Status: DC | PRN
  Administered 2019-05-21: 1000 mL

## 2019-05-21 SURGICAL SUPPLY — 44 items
ADH SKN CLS APL DERMABOND .7 (GAUZE/BANDAGES/DRESSINGS) ×1
APL PRP STRL LF DISP 70% ISPRP (MISCELLANEOUS) ×1
APPLIER CLIP 11 MED OPEN (CLIP)
APPLIER CLIP 9.375 SM OPEN (CLIP) ×2
APR CLP MED 11 20 MLT OPN (CLIP)
APR CLP SM 9.3 20 MLT OPN (CLIP) ×1
BLADE SURG 15 STRL LF DISP TIS (BLADE) ×1 IMPLANT
BLADE SURG 15 STRL SS (BLADE) ×2
CHLORAPREP W/TINT 26 (MISCELLANEOUS) ×2 IMPLANT
CLIP APPLIE 11 MED OPEN (CLIP) IMPLANT
CLIP APPLIE 9.375 SM OPEN (CLIP) IMPLANT
CLOTH BEACON ORANGE TIMEOUT ST (SAFETY) ×2 IMPLANT
CONT SPEC 4OZ CLIKSEAL STRL BL (MISCELLANEOUS) ×2 IMPLANT
COVER LIGHT HANDLE STERIS (MISCELLANEOUS) ×4 IMPLANT
COVER WAND RF STERILE (DRAPES) ×2 IMPLANT
DECANTER SPIKE VIAL GLASS SM (MISCELLANEOUS) ×2 IMPLANT
DERMABOND ADVANCED (GAUZE/BANDAGES/DRESSINGS) ×1
DERMABOND ADVANCED .7 DNX12 (GAUZE/BANDAGES/DRESSINGS) ×1 IMPLANT
ELECT REM PT RETURN 9FT ADLT (ELECTROSURGICAL) ×2
ELECTRODE REM PT RTRN 9FT ADLT (ELECTROSURGICAL) ×1 IMPLANT
GLOVE BIO SURGEON STRL SZ7 (GLOVE) ×1 IMPLANT
GLOVE BIO SURGEON STRL SZ8 (GLOVE) ×1 IMPLANT
GLOVE BIOGEL PI IND STRL 7.0 (GLOVE) ×2 IMPLANT
GLOVE BIOGEL PI IND STRL 8 (GLOVE) IMPLANT
GLOVE BIOGEL PI INDICATOR 7.0 (GLOVE) ×2
GLOVE BIOGEL PI INDICATOR 8 (GLOVE) ×1
GLOVE ECLIPSE 6.5 STRL STRAW (GLOVE) ×1 IMPLANT
GLOVE SURG SS PI 7.5 STRL IVOR (GLOVE) ×2 IMPLANT
GOWN STRL REUS W/TWL LRG LVL3 (GOWN DISPOSABLE) ×5 IMPLANT
INST SET MINOR GENERAL (KITS) ×2 IMPLANT
KIT TURNOVER KIT A (KITS) ×2 IMPLANT
MANIFOLD NEPTUNE II (INSTRUMENTS) ×2 IMPLANT
NDL HYPO 25X1 1.5 SAFETY (NEEDLE) ×1 IMPLANT
NEEDLE HYPO 25X1 1.5 SAFETY (NEEDLE) ×2 IMPLANT
NS IRRIG 1000ML POUR BTL (IV SOLUTION) ×2 IMPLANT
PACK MINOR (CUSTOM PROCEDURE TRAY) ×2 IMPLANT
PAD ARMBOARD 7.5X6 YLW CONV (MISCELLANEOUS) ×2 IMPLANT
SET BASIN LINEN APH (SET/KITS/TRAYS/PACK) ×2 IMPLANT
SPONGE INTESTINAL PEANUT (DISPOSABLE) ×1 IMPLANT
SPONGE LAP 18X18 RF (DISPOSABLE) ×2 IMPLANT
SUT MNCRL AB 4-0 PS2 18 (SUTURE) ×2 IMPLANT
SUT VIC AB 3-0 SH 27 (SUTURE) ×2
SUT VIC AB 3-0 SH 27X BRD (SUTURE) ×1 IMPLANT
SYR CONTROL 10ML LL (SYRINGE) ×2 IMPLANT

## 2019-05-21 NOTE — Interval H&P Note (Signed)
History and Physical Interval Note:  05/21/2019 8:53 AM  [facility]  has presented today for surgery, with the diagnosis of right axillary lymphadenopathy.  The various methods of treatment have been discussed with the patient and family. After consideration of risks, benefits and other options for treatment, the patient has consented to  Procedure(s): RIGHT AXILLARY LYMPH NODE BIOPSY (Right) as a surgical intervention.  The patient's history has been reviewed, patient examined, no change in status, stable for surgery.  I have reviewed the patient's chart and labs.  Questions were answered to the patient's satisfaction.     Aviva Signs

## 2019-05-21 NOTE — Anesthesia Postprocedure Evaluation (Signed)
Anesthesia Post Note  Patient: [facility]  Procedure(s) Performed: RIGHT AXILLARY LYMPH NODE BIOPSY (Right Axilla)  Patient location during evaluation: PACU Anesthesia Type: General Level of consciousness: awake and alert and patient cooperative Pain management: satisfactory to patient Vital Signs Assessment: post-procedure vital signs reviewed and stable Respiratory status: spontaneous breathing Cardiovascular status: stable Postop Assessment: no apparent nausea or vomiting Anesthetic complications: no     Last Vitals:  Vitals:   05/21/19 1022 05/21/19 1045  BP:  (!) 162/71  Pulse:  70  Resp:  (!) 27  Temp: 36.7 C   SpO2:  94%    Last Pain:  Vitals:   05/21/19 1045  PainSc: 0-No pain                 Haniyyah Sakuma

## 2019-05-21 NOTE — Op Note (Signed)
Patient:  [facility]  DOB:  Nov 08, 1945  MRN:  [id number]   Preop Diagnosis: Right axillary lymph node enlargement, history of ductal carcinoma in situ of right breast  Postop Diagnosis: Same  Procedure: Right axillary lymph node biopsy  Surgeon: Aviva Signs, MD  Anes: General endotracheal  Indications: Patient is a 73 year old white female who underwent a core biopsy of a lymph node in the right axilla.  Pathology was benign, but this was discordant with the ultrasound findings.  The patient now comes to the operating room for full excision of the right axillary lymph node.  The risks and benefits of the procedure including bleeding, infection, and pain were fully explained to the patient, who gave informed consent.  Procedure note: The patient was placed in supine position.  After induction of general endotracheal anesthesia, the right axilla was prepped and draped using usual sterile technique with ChloraPrep.  Surgical site confirmation was performed.  The lymph node was easily palpable.  An incision was made over it.  The dissection was then taken down to the lymph node which had a cystic component to it which ruptured.  I suspect the cystic component was due to the previous core biopsy.  It appeared to have 2 lymph nodes that were in close proximity to each other.  Both were removed en bloc without difficulty.  The bleeding was controlled using small clips.  The specimen was sent to radiology.  Specimen radiography did not reveal the clip to be in place, though I suspect the clip may have been dislodged during the dissection and the rupture of the cyst.  No other lymph nodes were enlarged in the right axilla.  The specimen was sent to pathology further evaluation and treatment.  The subcutaneous layer was reapproximated using a 3-0 Vicryl interrupted suture.  Sensorcaine was instilled into the surrounding wound.  The skin was closed using a 4-0 Monocryl subcuticular suture.  Dermabond  was applied.  All tape and needle counts were correct at the end of the procedure.  The patient was extubated in the operating room and transferred to PACU in stable condition.  Complications: None  EBL: Minimal  Specimen: Right axillary lymph nodes

## 2019-05-21 NOTE — Discharge Instructions (Signed)
Lymphadenopathy  Lymphadenopathy means that your lymph glands are swollen or larger than normal (enlarged). Lymph glands, also called lymph nodes, are collections of tissue that filter bacteria, viruses, and waste from your bloodstream. They are part of your body's disease-fighting system (immune system), which protects your body from germs. There may be different causes of lymphadenopathy, depending on where it is in your body. Some types go away on their own. Lymphadenopathy can occur anywhere that you have lymph glands, including these areas:  Neck (cervical lymphadenopathy).  Chest (mediastinal lymphadenopathy).  Lungs (hilar lymphadenopathy).  Underarms (axillary lymphadenopathy).  Groin (inguinal lymphadenopathy). When your immune system responds to germs, infection-fighting cells and fluid build up in your lymph glands. This causes some swelling and enlargement. If the lymph glands do not go back to normal after you have an infection or disease, your health care provider may do tests. These tests help to monitor your condition and find the reason why the glands are still swollen and enlarged. Follow these instructions at home:  Get plenty of rest.  Take over-the-counter and prescription medicines only as told by your health care provider. Your health care provider may recommend over-the-counter medicines for pain.  If directed, apply heat to swollen lymph glands as often as told by your health care provider. Use the heat source that your health care provider recommends, such as a moist heat pack or a heating pad. ? Place a towel between your skin and the heat source. ? Leave the heat on for 20-30 minutes. ? Remove the heat if your skin turns bright red. This is especially important if you are unable to feel pain, heat, or cold. You may have a greater risk of getting burned.  Check your affected lymph glands every day for changes. Check other lymph gland areas as told by your health  care provider. Check for changes such as: ? More swelling. ? Sudden increase in size. ? Redness or pain. ? Hardness.  Keep all follow-up visits as told by your health care provider. This is important. Contact a health care provider if you have:  Swelling that gets worse or spreads to other areas.  Problems with breathing.  Lymph glands that: ? Are still swollen after 2 weeks. ? Have suddenly gotten bigger. ? Are red, painful, or hard.  A fever or chills.  Fatigue.  A sore throat.  Pain in your abdomen.  Weight loss.  Night sweats. Get help right away if you have:  Fluid leaking from an enlarged lymph gland.  Severe pain.  Chest pain.  Shortness of breath. Summary  Lymphadenopathy means that your lymph glands are swollen or larger than normal (enlarged).  Lymph glands (also called lymph nodes) are collections of tissue that filter bacteria, viruses, and waste from the bloodstream. They are part of your body's disease-fighting system (immune system).  Lymphadenopathy can occur anywhere that you have lymph glands.  If your enlarged and swollen lymph glands do not go back to normal after you have an infection or disease, your health care provider may do tests to monitor your condition and find the reason why the glands are still swollen and enlarged.  Check your affected lymph glands every day for changes. Check other lymph gland areas as told by your health care provider. This information is not intended to replace advice given to you by your health care provider. Make sure you discuss any questions you have with your health care provider. Document Released: 05/25/2008 Document Revised: 07/29/2017 Document Reviewed:  07/01/2017 Elsevier Patient Education  [employer].   Axillary Lymph Node Dissection, Care After This sheet gives you information about how to care for yourself after your procedure. Your health care provider may also give you more specific  instructions. If you have problems or questions, contact your health care provider. What can I expect after the procedure? After the procedure, it is common to have:  Pain and soreness around your incision area.  Trouble moving your arm or shoulder.  A small amount of swelling in your arm.  Numbness on the upper and inside parts of your arm. Follow these instructions at home: Medicines  Take over-the-counter and prescription medicines only as told by your health care provider.  If you were prescribed an antibiotic medicine, take it as told by your health care provider. Do not stop taking the antibiotic even if you start to feel better. Incision care   Follow instructions from your health care provider about how to take care of your incision. Make sure you: ? Wash your hands with soap and water before you change your bandage (dressing). If soap and water are not available, use hand sanitizer. ? Change your dressing as told by your health care provider. ? Leave stitches (sutures), skin glue, or adhesive strips in place. These skin closures may need to stay in place for 2 weeks or longer. If adhesive strip edges start to loosen and curl up, you may trim the loose edges. Do not remove adhesive strips completely unless your health care provider tells you to do that.  Check your incision area every day for signs of infection. Check for: ? Redness, swelling, or pain. ? Fluid or blood. ? Warmth. ? Pus or a bad smell. Activity  Do arm and shoulder exercises as told by your health care provider. This may prevent movement problems and stiffness.  Return to your normal activities as told by your health care provider. Ask your health care provider what activities are safe for you. Avoid any activities that cause pain. Driving  Do not drive for 24 hours if you were given a medicine to help you relax (sedative).  Do not drive or use heavy machinery while taking prescription pain  medicine. General instructions  If a drainage tube was left in your breast, care for it as told by your health care provider. The drain may stay in place for up to 7-10 days.  Wear a compression garment on your arm as told by your health care provider. This may help to prevent blood clots and reduce swelling in your arm.  Do not use any products that contain nicotine or tobacco, such as cigarettes and e-cigarettes. If you need help quitting, ask your health care provider.  Do not take baths, swim, or use a hot tub until your health care provider approves. Ask your health care provider if you may take showers. You may only be allowed to take sponge baths for bathing.  Do not have your blood pressure taken, have blood drawn, or get injections or IVs in the arm on the side where your lymph nodes were removed.  Follow instructions from your health care provider about how often you should be screened for extra fluid around your lymph nodes (lymphedema).  Keep all follow-up visits as told by your health care provider. This is important. Contact a health care provider if:  Your arm is swollen, tight, and painful.  You have redness, swelling, or pain around your incision.  You have fluid  or blood coming from your incision.  Your incision feels warm to the touch.  You have pus or a bad smell coming from your incision. Get help right away if:  You have severe pain that does not get better with medicine.  You have a fever or chills.  You are confused.  You have chest pain.  You have shortness of breath. Summary  After the procedure, it is common to have pain and soreness and trouble moving your arm or shoulder.  A small amount of arm swelling is normal after the procedure. However, you should contact your health care provider if your arm is swollen, tight, and painful.  Wear a compression garment on your arm as told by your health care provider. This may help to prevent blood clots  and reduce swelling in your arm.  Do arm and shoulder exercises as directed to help prevent movement problems and stiffness.  If a drainage tube was left in your breast, care for it as told by your health care provider. This information is not intended to replace advice given to you by your health care provider. Make sure you discuss any questions you have with your health care provider. Document Released: 10/13/2016 Document Revised: 12/05/2018 Document Reviewed: 10/13/2016 Elsevier Patient Education  2020 [employer].

## 2019-05-21 NOTE — Anesthesia Procedure Notes (Signed)
Procedure Name: LMA Insertion Date/Time: 05/21/2019 9:35 AM Performed by: Vista Deck, CRNA Pre-anesthesia Checklist: Patient identified, Patient being monitored, Emergency Drugs available, Timeout performed and Suction available Patient Re-evaluated:Patient Re-evaluated prior to induction Oxygen Delivery Method: Circle System Utilized Preoxygenation: Pre-oxygenation with 100% oxygen Induction Type: IV induction Ventilation: Mask ventilation without difficulty LMA: LMA inserted LMA Size: 4.0 Number of attempts: 1 Placement Confirmation: positive ETCO2 and breath sounds checked- equal and bilateral Tube secured with: Tape Dental Injury: Teeth and Oropharynx as per pre-operative assessment

## 2019-05-21 NOTE — Transfer of Care (Signed)
Immediate Anesthesia Transfer of Care Note  Patient: Maria Ritter  Procedure(s) Performed: RIGHT AXILLARY LYMPH NODE BIOPSY (Right Axilla)  Patient Location: PACU  Anesthesia Type:General  Level of Consciousness: awake, alert  and patient cooperative  Airway & Oxygen Therapy: Patient Spontanous Breathing  Post-op Assessment: Report given to RN and Post -op Vital signs reviewed and stable  Post vital signs: Reviewed and stable  Last Vitals:  Vitals Value Taken Time  BP 134/61 05/21/19 1022  Temp    Pulse 60 05/21/19 1024  Resp 12 05/21/19 1024  SpO2 96 % 05/21/19 1024  Vitals shown include unvalidated device data.  Last Pain:  Vitals:   05/21/19 0759  PainSc: 0-No pain         Complications: No apparent anesthesia complications

## 2019-05-21 NOTE — Anesthesia Preprocedure Evaluation (Signed)
Anesthesia Evaluation  Patient identified by MRN, date of birth, ID band Patient awake    Reviewed: Allergy & Precautions, NPO status , Patient's Chart, lab work & pertinent test results  History of Anesthesia Complications (+) PONV and history of anesthetic complications  Airway Mallampati: III  TM Distance: >3 FB Neck ROM: Full    Dental no notable dental hx. (+) Teeth Intact   Pulmonary neg pulmonary ROS,    Pulmonary exam normal breath sounds clear to auscultation       Cardiovascular Exercise Tolerance: Good hypertension, Pt. on medications negative cardio ROS Normal cardiovascular examI Rhythm:Regular Rate:Normal     Neuro/Psych negative neurological ROS  negative psych ROS   GI/Hepatic Neg liver ROS, GERD  Medicated and Controlled,  Endo/Other  Hypothyroidism   Renal/GU negative Renal ROS  negative genitourinary   Musculoskeletal negative musculoskeletal ROS (+)   Abdominal   Peds negative pediatric ROS (+)  Hematology negative hematology ROS (+)   Anesthesia Other Findings   Reproductive/Obstetrics negative OB ROS                             Anesthesia Physical Anesthesia Plan  ASA: II  Anesthesia Plan: General   Post-op Pain Management:    Induction: Intravenous  PONV Risk Score and Plan: 3 and Dexamethasone, Ondansetron, Treatment may vary due to age or medical condition and Midazolam  Airway Management Planned: Oral ETT and LMA  Additional Equipment:   Intra-op Plan:   Post-operative Plan: Extubation in OR  Informed Consent: I have reviewed the patients History and Physical, chart, labs and discussed the procedure including the risks, benefits and alternatives for the proposed anesthesia with the patient or authorized representative who has indicated his/her understanding and acceptance.     Dental advisory given  Plan Discussed with: CRNA  Anesthesia  Plan Comments: (Plan Full PPE use Plan GA (LMA) vsGETA D/W PT -WTP with same after Q&A)        Anesthesia Quick Evaluation

## 2019-05-22 ENCOUNTER — Ambulatory Visit (HOSPITAL_COMMUNITY): Payer: Medicare HMO | Admitting: Hematology

## 2019-05-22 LAB — SURGICAL PATHOLOGY

## 2019-05-23 ENCOUNTER — Other Ambulatory Visit: Payer: Self-pay | Admitting: General Surgery

## 2019-05-23 ENCOUNTER — Encounter (HOSPITAL_COMMUNITY): Payer: Self-pay | Admitting: General Surgery

## 2019-05-23 DIAGNOSIS — R238 Other skin changes: Secondary | ICD-10-CM | POA: Diagnosis not present

## 2019-05-23 LAB — SURGICAL PATHOLOGY

## 2019-05-24 ENCOUNTER — Ambulatory Visit (HOSPITAL_COMMUNITY): Payer: Medicare HMO | Admitting: Hematology

## 2019-05-24 NOTE — Progress Notes (Signed)
05-24-2019 at 0920am, call to Dr. Arnoldo Morale office. Spoke with Claiborne Billings regarding patient complaints. Claiborne Billings spoke with Dr. Arnoldo Morale and requested the patient call the office and hit option one on the recorded message.

## 2019-05-24 NOTE — Progress Notes (Signed)
Call received from patient at home [phone] on 05/24/2019 at 0910am.  Patient states she has a "rash, hives on her right arm extending to my throat, it is warm to touch and itchy. I called the office and I cannot get someone to answer". "I called the pharmacy last night since I couldn't get through to the office line around 6pm last night.  They told me to use Ibuprofen and the ice pack, rash is not located on the incision".  Patient denies difficulty swallowing, difficulty breathing and states , "rash is no where else on my body except on my right arm extending up to my throat".  Patient asking "should I see Dr. Arnoldo Morale or call my primary doctor". Advised patient I will contact Dr. Arnoldo Morale and call patient back with further instructions. Patient agrees.

## 2019-05-24 NOTE — Progress Notes (Signed)
05-24-2019 0928am call to patient at [phone]. Advised patient to call Dr. Arnoldo Morale office and dial option one.  Dr. Arnoldo Morale will speak with patient when she calls his office.  Patient verbalized understanding.

## 2019-05-26 ENCOUNTER — Emergency Department (HOSPITAL_COMMUNITY)
Admission: EM | Admit: 2019-05-26 | Discharge: 2019-05-26 | Disposition: A | Discharge status: Home / Self Care | Payer: Medicare HMO | Attending: Emergency Medicine | Admitting: Emergency Medicine

## 2019-05-26 ENCOUNTER — Other Ambulatory Visit: Payer: Self-pay

## 2019-05-26 ENCOUNTER — Encounter (HOSPITAL_COMMUNITY): Payer: Self-pay | Admitting: Emergency Medicine

## 2019-05-26 DIAGNOSIS — Z4800 Encounter for change or removal of nonsurgical wound dressing: Secondary | ICD-10-CM | POA: Diagnosis not present

## 2019-05-26 DIAGNOSIS — Z7982 Long term (current) use of aspirin: Secondary | ICD-10-CM | POA: Insufficient documentation

## 2019-05-26 DIAGNOSIS — E039 Hypothyroidism, unspecified: Secondary | ICD-10-CM | POA: Insufficient documentation

## 2019-05-26 DIAGNOSIS — L7634 Postprocedural seroma of skin and subcutaneous tissue following other procedure: Secondary | ICD-10-CM

## 2019-05-26 DIAGNOSIS — E89822 Postprocedural seroma of an endocrine system organ or structure following an endocrine system procedure: Secondary | ICD-10-CM | POA: Diagnosis not present

## 2019-05-26 DIAGNOSIS — Z5189 Encounter for other specified aftercare: Secondary | ICD-10-CM

## 2019-05-26 DIAGNOSIS — M353 Polymyalgia rheumatica: Secondary | ICD-10-CM | POA: Diagnosis not present

## 2019-05-26 DIAGNOSIS — I10 Essential (primary) hypertension: Secondary | ICD-10-CM | POA: Diagnosis not present

## 2019-05-26 DIAGNOSIS — Z79899 Other long term (current) drug therapy: Secondary | ICD-10-CM | POA: Insufficient documentation

## 2019-05-26 DIAGNOSIS — Z9889 Other specified postprocedural states: Secondary | ICD-10-CM

## 2019-05-26 DIAGNOSIS — Z4801 Encounter for change or removal of surgical wound dressing: Secondary | ICD-10-CM | POA: Diagnosis present

## 2019-05-26 LAB — BASIC METABOLIC PANEL
Anion gap: 11 (ref 5–15)
BUN: 24 mg/dL — ABNORMAL HIGH (ref 8–23)
CO2: 26 mmol/L (ref 22–32)
Calcium: 9 mg/dL (ref 8.9–10.3)
Chloride: 101 mmol/L (ref 98–111)
Creatinine, Ser: 0.66 mg/dL (ref 0.44–1.00)
GFR calc Af Amer: >60 mL/min (ref >60)
GFR calc non Af Amer: >60 mL/min (ref >60)
Glucose, Bld: 113 mg/dL — ABNORMAL HIGH (ref 70–99)
Potassium: 4.1 mmol/L (ref 3.5–5.1)
Sodium: 138 mmol/L (ref 135–145)

## 2019-05-26 LAB — CBC WITH DIFFERENTIAL/PLATELET
Abs Immature Granulocytes: 0.04 10*3/uL (ref 0.00–0.07)
Basophils Absolute: 0.1 10*3/uL (ref 0.0–0.1)
Basophils Relative: 1 %
Eosinophils Absolute: 0.3 10*3/uL (ref 0.0–0.5)
Eosinophils Relative: 3 %
HCT: 43.2 % (ref 36.0–46.0)
Hemoglobin: 13.4 g/dL (ref 12.0–15.0)
Immature Granulocytes: 0 %
Lymphocytes Relative: 24 %
Lymphs Abs: 2.9 10*3/uL (ref 0.7–4.0)
MCH: 29.5 pg (ref 26.0–34.0)
MCHC: 31 g/dL (ref 30.0–36.0)
MCV: 95.2 fL (ref 80.0–100.0)
Monocytes Absolute: 1.1 10*3/uL — ABNORMAL HIGH (ref 0.1–1.0)
Monocytes Relative: 9 %
Neutro Abs: 7.5 10*3/uL (ref 1.7–7.7)
Neutrophils Relative %: 63 %
Platelets: 288 10*3/uL (ref 150–400)
RBC: 4.54 MIL/uL (ref 3.87–5.11)
RDW: 13.6 % (ref 11.5–15.5)
WBC: 12 10*3/uL — ABNORMAL HIGH (ref 4.0–10.5)
nRBC: 0 % (ref 0.0–0.2)

## 2019-05-26 LAB — LACTIC ACID, PLASMA: Lactic Acid, Venous: 0.9 mmol/L (ref 0.5–1.9)

## 2019-05-26 MED ORDER — LIDOCAINE-EPINEPHRINE (PF) 2 %-1:200000 IJ SOLN
10.0000 mL | Freq: Once | INTRAMUSCULAR | Status: AC
  Administered 2019-05-26: 10 mL
  Filled 2019-05-26: qty 10

## 2019-05-26 NOTE — ED Provider Notes (Signed)
[facility] EMERGENCY DEPARTMENT Provider Note   CSN: [id number] Arrival date & time: 05/26/19  1617     History   Chief Complaint Chief Complaint  Patient presents with  . Wound Check    HPI Maria Ritter is a 73 y.o. 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  Has been putting ice and alternating ibuprofen every 8 hours without relief.  Yesterday afternoon she felt hot and got concerned about an infection.  She called Dr. Arnoldo Morale who told her everything was likely okay and that he would see her on Tuesday.  She denies any drainage or dehiscence of the wound.  She denies any distal tingling, loss of sensation. No modifying factors.      HPI  Past Medical History:  Diagnosis Date  . Breast cancer ([place]) 2010   Right Lumpectomy and XRT  . Carotid artery disease ([facility])    Mild to moderate bilateral ICA disease, left greater than right 07/2015  . Complication of anesthesia   . Essential hypertension   . GERD (gastroesophageal reflux disease)   . Glaucoma   . Hyperlipidemia   . Hypothyroidism   . Osteoarthritis   . Polymyalgia rheumatica ([place])   . PONV (postoperative nausea and vomiting)     Patient Active Problem List   Diagnosis Date Noted  . Lymph node enlargement   . Flexural atopic dermatitis 01/31/2018  . Itching 11/01/2017  . Seasonal and perennial allergic rhinitis 11/01/2017  . History of breast cancer in female 04/21/2016  . Routine cervical smear 04/21/2016  . Vaginal atrophy 04/21/2016  . OVERWEIGHT 01/28/2010  . LEG CRAMPS, NOCTURNAL 01/28/2010  . ELECTROCARDIOGRAM, ABNORMAL 01/28/2010  . CARCINOMA IN SITU, RIGHT BREAST 11/27/2009  . HYPERLIPIDEMIA 11/27/2009  . ARM PAIN,  LEFT 11/27/2009  . HYPOTHYROIDISM 11/26/2009  . Essential hypertension 11/26/2009    Past Surgical History:  Procedure Laterality Date  . AXILLARY LYMPH NODE BIOPSY Right 05/21/2019   Procedure: RIGHT AXILLARY LYMPH NODE BIOPSY;  Surgeon: Aviva Signs, MD;  Location: [facility];  Service: General;  Laterality: Right;  . BASAL CELL CARCINOMA EXCISION  2010   Right breast  . BREAST LUMPECTOMY Right 2010  . ENDOMETRIAL ABLATION  2002  . FINGER SURGERY     Minor surgical intervention following a crush injury to the left fourth finger  . TOOTH EXTRACTION  01/2019     OB History    Gravida  0   Para  0   Term  0   Preterm  0   AB  0   Living  0     SAB  0   TAB  0   Ectopic  0   Multiple  0   Live Births  0            Home Medications    Prior to Admission medications   Medication Sig Start Date End Date Taking? Authorizing Provider  amLODipine (NORVASC) 5 MG tablet Take 5 mg by mouth daily.     Yes [provider]  aspirin 81 MG tablet Take 81 mg by mouth daily.     Yes [provider]  CALCIUM CITRATE-VITAMIN D PO Take 1 tablet by mouth daily.    Yes [provider]  Cholecalciferol (VITAMIN D3) 2000 units TABS Take 2,000 Units by mouth 2 (two) times daily.    Yes [provider]  fexofenadine (ALLEGRA) 180 MG tablet Take 1 tablet (180 mg total) by mouth daily as needed for allergies or rhinitis. 08/04/18  Yes [name], MD  GLUCOSAMINE-CHONDROITIN PO Take 1 tablet by mouth 2 (two) times daily.    Yes [provider]  ibuprofen (ADVIL,MOTRIN) 200 MG tablet Take 200 mg by mouth every 6 (six) hours as needed for moderate pain.    Yes [provider]  levothyroxine (SYNTHROID, LEVOTHROID) 137 MCG tablet Take 137 mcg by mouth daily before breakfast.  05/10/18  Yes [provider]  lisinopril (PRINIVIL,ZESTRIL) 10 MG tablet Take 10 mg by mouth daily.   Yes [provider]  Magnesium 250  MG TABS Take 250 mg by mouth daily.    Yes [provider]  methocarbamol (ROBAXIN) 500 MG tablet Take 500 mg by mouth 3 (three) times daily as needed for muscle spasms.  04/04/19  Yes [provider]  metroNIDAZOLE (METROGEL) 0.75 % gel Apply 1 application topically 2 (two) times daily as needed (rosacea).    Yes [provider]  Omega-3 1000 MG CAPS Take 1,000 mg by mouth 2 (two) times daily.   Yes [provider]  Polyethyl Glycol-Propyl Glycol (SYSTANE OP) Apply 1 drop to eye 2 (two) times daily as needed (dry eyes).    Yes [provider]  raloxifene (EVISTA) 60 MG tablet Take 60 mg by mouth daily.     Yes [provider]  sodium chloride (OCEAN) 0.65 % SOLN nasal spray Place 1 spray into both nostrils as needed for congestion.   Yes [provider]  traMADol (ULTRAM) 50 MG tablet Take 1 tablet (50 mg total) by mouth every 6 (six) hours as needed. 05/17/19  Yes Aviva Signs, MD  triamcinolone cream (KENALOG) 0.1 % Apply 1 application topically 2 (two) times daily as needed (itching).    Yes [provider]  Turmeric 500 MG CAPS Take 500 mg by mouth daily.   Yes [provider]  vitamin B-12 (CYANOCOBALAMIN) 1000 MCG tablet Take 1,000 mcg by mouth daily.    Yes [provider]    Family History Family History  Problem Relation Age of Onset  . Pneumonia Mother   . Parkinsonism Mother   . Heart failure Father     Social History Social History   Tobacco Use  . Smoking status: Never Smoker  . Smokeless tobacco: Never Used  Substance Use Topics  . Alcohol use: No    Alcohol/week: 0.0 standard drinks  . Drug use: No     Allergies   Mango flavor, Erythromycin, Ivp dye [iodinated diagnostic agents], Penicillins, Sulfonamide derivatives, and Neosporin [bacitracin-polymyxin b]   Review of Systems Review of Systems  Skin:       Swelling redness warmth tenderness right axilla   All other systems  reviewed and are negative.    Physical Exam Updated Vital Signs BP 137/82 (BP Location: Left Arm)   Pulse 100   Temp 99.1 F (37.3 C) (Oral)   Resp 18   Ht 5\' 5"  (1.651 m)   Wt 79.4 kg   SpO2 96%   BMI 29.12 kg/m   Physical Exam Constitutional:      Appearance: She is well-developed.  HENT:     Head: Normocephalic.     Nose: Nose normal.  Eyes:  General: Lids are normal.  Neck:     Musculoskeletal: Normal range of motion.     Comments: No supraclavicular fossa edema, ecchymosis or abnormalities Cardiovascular:     Rate and Rhythm: Normal rate.     Comments: 1+ radial pulses bilaterally.  Right hand/fingers well perfused and warm Pulmonary:     Effort: Pulmonary effort is normal. No respiratory distress.  Musculoskeletal: Normal range of motion.  Skin:    Comments: approx tennis ball sized area of tenderness, central firmness and very mild fluctuance peripherally to right axillar (see below). Some local ecchymosis noted. Surgical incision is closed without dehiscence, drainage, bleeding   Neurological:     Mental Status: She is alert.     Comments: Sensation and strength intact in the right upper extremity  Psychiatric:        Behavior: Behavior normal.        ED Treatments / Results  Labs (all labs ordered are listed, but only abnormal results are displayed) Labs Reviewed  CBC WITH DIFFERENTIAL/PLATELET - Abnormal; Notable for the following components:      Result Value   WBC 12.0 (*)    Monocytes Absolute 1.1 (*)    All other components within normal limits  BASIC METABOLIC PANEL - Abnormal; Notable for the following components:   Glucose, Bld 113 (*)    BUN 24 (*)    All other components within normal limits  LACTIC ACID, PLASMA  LACTIC ACID, PLASMA    EKG None  Radiology No results found.  Procedures Ultrasound ED Soft Tissue  Date/Time: 05/26/2019 8:26 PM Performed by: Kinnie Feil, PA-C Authorized by: Kinnie Feil, PA-C    Procedure details:    Indications comment:  Post surgical swelling    Transverse view:  Visualized   Images: archived     Limitations:  Positioning Location:    Location: upper extremity     Location comment:  Right axilla   Side:  Right Findings:     no cellulitis present Comments:     Loculated hypoechoic collections with minimal surrounding cobblestoning  Aspiration of blood/fluid  Date/Time: 05/26/2019 8:29 PM Performed by: Kinnie Feil, PA-C Authorized by: Kinnie Feil, PA-C  Consent: Verbal consent obtained. Written consent obtained. Risks and benefits: risks, benefits and alternatives were discussed Consent given by: patient Patient understanding: patient states understanding of the procedure being performed Patient consent: the patient's understanding of the procedure matches consent given Procedure consent: procedure consent matches procedure scheduled Test results: test results available and properly labeled Site marked: the operative site was marked Imaging studies: imaging studies available Required items: required blood products, implants, devices, and special equipment available Patient identity confirmed: verbally with patient and arm band Preparation: Patient was prepped and draped in the usual sterile fashion. Local anesthesia used: yes Anesthesia: local infiltration  Anesthesia: Local anesthesia used: yes Local Anesthetic: lidocaine 2% with epinephrine Anesthetic total: 5 mL  Sedation: Patient sedated: no  Patient tolerance: patient tolerated the procedure well with no immediate complications Comments: 36 cc serosanguinous fluid aspirated     (including critical care time)  Medications Ordered in ED Medications  lidocaine-EPINEPHrine (XYLOCAINE W/EPI) 2 %-1:200000 (PF) injection 10 mL (has no administration in time range)     Initial Impression / Assessment and Plan / ED Course  I have reviewed the triage vital signs and the nursing  notes.  Pertinent labs & imaging results that were available during my care of the patient were reviewed by me  and considered in my medical decision making (see chart for details).  Clinical Course as of May 25 2029  Sat May 26, 2019  1842 WBC(!): 12.0 [CG]  1842 Lactic Acid, Venous: 0.9 [CG]    Clinical Course User Index [CG] Kinnie Feil, PA-C   EMR reviewed to obtain pertinent PMH.   Ddx includes hematoma vs early abscess.  Favoring hematoma. She has no fever, chills, leukocytosis.  Swelling is more firm than fluctuance and there isn't significant warmth or tenderness. No distal neuro or pulse deficits. No associated paresthesias. She is not on blood thinners. Bedside US shows hypoechoic fluid collection with loculations but no significant cobblestoning.  Pt examined by EDP as well. We will call surgery to notify them of possible surgical hematoma, obtain recommendations.  Considering antibiotics to cover for subtle infection.  Pt has appointment with Dr Arnoldo Morale on Tuesday.   2025: spoke to Dr Constance Haw who suspects this is seroma, recommends aspiration here for symptom control.  Aspiration performed by me without immediate complications.  36 cc serosanguinous fluid aspirated with significant improvement in edema. Dc with ice, compression, NSAID and f/u in clinic on Tuesday. Return precautions given. Pt in agreeable with plan.  Final Clinical Impressions(s) / ED Diagnoses   Final diagnoses:  Visit for wound check  S/P lymph node biopsy  Postoperative seroma of subcutaneous tissue after non-dermatologic procedure    ED Discharge Orders    None       Arlean Hopping 05/26/19 2030    Milton Ferguson, MD 05/30/19 1006

## 2019-05-26 NOTE — Discharge Instructions (Signed)
Swelling today was aspirated and it improved.  We suspect your swelling is from a seroma or collection of fluid and blood.  No signs of infection today.  Ice every 6-8 hours throughout the day for approximately 20 minutes at a time.  Apply pressure as often as he can to the area.  Alternate ibuprofen and acetaminophen every 6 hours and as needed for pain.  Monitor for signs of infection and return for increased pain, swelling redness warmth, fevers greater than 100.4.  We spoke to Dr. Constance Haw who recommends clinic follow-up on Tuesday, call the office to confirm the appointment time.

## 2019-05-26 NOTE — ED Notes (Signed)
Pressure dressing applied, pt stated understanding on wound care.

## 2019-05-26 NOTE — ED Triage Notes (Addendum)
Patient had lymph node removed from right axillary for biopsy on 9/21. Patient states biopsy was clear but now she has swelling and hit at site. Per patient temp of 99.8 last night. Patient states appointment with surgeon on Tuesday, called surgeon and was advised to take advil. Patient used ice and took advil with some relief, last took advil at 1430 today. Denies any drainage at site. Large amount of bruising with swelling noted at surgical site.

## 2019-05-28 ENCOUNTER — Other Ambulatory Visit: Payer: Self-pay

## 2019-05-28 ENCOUNTER — Other Ambulatory Visit (HOSPITAL_COMMUNITY): Payer: Medicare HMO

## 2019-05-28 ENCOUNTER — Encounter (HOSPITAL_COMMUNITY): Payer: Self-pay | Admitting: Hematology

## 2019-05-28 ENCOUNTER — Inpatient Hospital Stay (HOSPITAL_COMMUNITY): Payer: Medicare HMO | Attending: Hematology | Admitting: Hematology

## 2019-05-28 VITALS — BP 128/66 | HR 89 | Temp 97.1°F | Resp 16 | Wt 171.6 lb

## 2019-05-28 DIAGNOSIS — D0511 Intraductal carcinoma in situ of right breast: Secondary | ICD-10-CM | POA: Diagnosis not present

## 2019-05-28 DIAGNOSIS — Z79811 Long term (current) use of aromatase inhibitors: Secondary | ICD-10-CM | POA: Insufficient documentation

## 2019-05-28 NOTE — Patient Instructions (Addendum)
Maria Ritter at [facility] Discharge Instructions  You were seen today by Dr. Delton Coombes. He went over your recent lab results. He will have blood drawn today. He will see you back in 1 year for labs and follow up.   Thank you for choosing [place] at [facility] to provide your oncology and hematology care.  To afford each patient quality time with our provider, please arrive at least 15 minutes before your scheduled appointment time.   If you have a lab appointment with the [facility] please come in thru the  Main Entrance and check in at the main information desk  You need to re-schedule your appointment should you arrive 10 or more minutes late.  We strive to give you quality time with our providers, and arriving late affects you and other patients whose appointments are after yours.  Also, if you no show three or more times for appointments you may be dismissed from the clinic at the providers discretion.     Again, thank you for choosing [facility].  Our hope is that these requests will decrease the amount of time that you wait before being seen by our physicians.       _____________________________________________________________  Should you have questions after your visit to [facility], please contact our office at [phone] between the hours of 8:00 a.m. and 4:30 p.m.  Voicemails left after 4:00 p.m. will not be returned until the following business day.  For prescription refill requests, have your pharmacy contact our office and allow 72 hours.    Cancer Center Support Programs:   > Cancer Support Group  2nd Tuesday of the month 1pm-2pm, Journey Room

## 2019-05-28 NOTE — Assessment & Plan Note (Signed)
1.  Right breast DCIS: -Diagnosed on 09/25/2008 after right breast biopsy, right breast lumpectomy and lymph node biopsy on 10/24/2008. -Pathology showed high-grade DCIS, negative margins, 1.9 cm, 0/4 lymph nodes positive, ER 99% positive, PR 59% positive, free margins, PT ISP N0. - She took Evista since then. - Recent ultrasound on 04/10/2019 showed right axillary lymph node, ultrasound-guided core biopsy shows reactive lymph node. - Right axillary lymph node excision biopsy on 05/21/2019 shows reactive lymphoid hyperplasia with sinus histiocytes. - I have told her to discontinue Evista.  She will be seen back in 1 year with repeat mammogram.  2.  Osteopenia: -DEXA scan on 07/17/2018 shows T score of -1.4.  This is stable compared to DEXA scan in 2017. - She was counseled to take calcium and vitamin D supplements.  3.  Elevated liver enzymes: -She has elevated AST and ALT, mild at 61 and 36 respectively.  She had elevated LFTs last year also. - CT of the abdomen from 2011 showed mild diffuse fatty infiltration of the liver which could be contributing to enzyme elevation.

## 2019-05-28 NOTE — Progress Notes (Signed)
Pt stated she is allergic to surgical drapes.  Following her procedure last week, she had hives where the surgical drape was.

## 2019-05-28 NOTE — Progress Notes (Signed)
[place] [address] [place] [address]   CLINIC:  Medical Oncology/Hematology  PCP:  Celene Squibb, MD [place] [address] [phone]   REASON FOR VISIT:  Follow-up for right breast DCIS in the right axillary lymphadenopathy which is new.  CURRENT THERAPY: Evista daily.   INTERVAL HISTORY:  Ms. Field 73 y.o. female is seen for follow-up of right breast DCIS.  She underwent excisional biopsy of the right axillary lymph node on 05/21/2019.  She is recovering well although there is some swelling.  She went to the ER when fluid was aspirated.  She is seeing Dr. Arnoldo Morale tomorrow.  Denies any fevers or chills.  Appetite is 75%.  Energy levels are 50%.   REVIEW OF SYSTEMS:  Review of Systems  Gastrointestinal: Positive for diarrhea.  All other systems reviewed and are negative.    PAST MEDICAL/SURGICAL HISTORY:  Past Medical History:  Diagnosis Date  . Breast cancer ([facility]) 2010   Right Lumpectomy and XRT  . Carotid artery disease ([facility])    Mild to moderate bilateral ICA disease, left greater than right 07/2015  . Complication of anesthesia   . Essential hypertension   . GERD (gastroesophageal reflux disease)   . Glaucoma   . Hyperlipidemia   . Hypothyroidism   . Osteoarthritis   . Polymyalgia rheumatica ([facility])   . PONV (postoperative nausea and vomiting)    Past Surgical History:  Procedure Laterality Date  . AXILLARY LYMPH NODE BIOPSY Right 05/21/2019   Procedure: RIGHT AXILLARY LYMPH NODE BIOPSY;  Surgeon: Aviva Signs, MD;  Location: [facility];  Service: General;  Laterality: Right;  . BASAL CELL CARCINOMA EXCISION  2010   Right breast  . BREAST LUMPECTOMY Right 2010  . ENDOMETRIAL ABLATION  2002  . FINGER SURGERY     Minor surgical intervention following a crush injury to the left fourth finger  . TOOTH EXTRACTION  01/2019     SOCIAL HISTORY:  Social History   Socioeconomic History  . Marital status: Widowed   Spouse name: Not on file  . Number of children: Not on file  . Years of education: Not on file  . Highest education level: Not on file  Occupational History  . Occupation: Retired from [employer]: RETIRED  Social Needs  . Financial resource strain: Not on file  . Food insecurity    Worry: Not on file    Inability: Not on file  . Transportation needs    Medical: Not on file    Non-medical: Not on file  Tobacco Use  . Smoking status: Never Smoker  . Smokeless tobacco: Never Used  Substance and Sexual Activity  . Alcohol use: No    Alcohol/week: 0.0 standard drinks  . Drug use: No  . Sexual activity: Not Currently    Birth control/protection: Surgical    Comment: ablation  Lifestyle  . Physical activity    Days per week: Not on file    Minutes per session: Not on file  . Stress: Not on file  Relationships  . Social Herbalist on phone: Not on file    Gets together: Not on file    Attends religious service: Not on file    Active member of club or organization: Not on file    Attends meetings of clubs or organizations: Not on file    Relationship status: Not on file  . Intimate partner violence  Fear of current or ex partner: Not on file    Emotionally abused: Not on file    Physically abused: Not on file    Forced sexual activity: Not on file  Other Topics Concern  . Not on file  Social History Narrative   Widowed with no children    FAMILY HISTORY:  Family History  Problem Relation Age of Onset  . Pneumonia Mother   . Parkinsonism Mother   . Heart failure Father     CURRENT MEDICATIONS:  Outpatient Encounter Medications as of 05/28/2019  Medication Sig  . amLODipine (NORVASC) 5 MG tablet Take 5 mg by mouth daily.    Marland Kitchen aspirin 81 MG tablet Take 81 mg by mouth daily.    Marland Kitchen CALCIUM CITRATE-VITAMIN D PO Take 1 tablet by mouth daily.   . Cholecalciferol (VITAMIN D3) 2000 units TABS Take 2,000 Units by mouth 2 (two) times daily.   .  fexofenadine (ALLEGRA) 180 MG tablet Take 1 tablet (180 mg total) by mouth daily as needed for allergies or rhinitis.  Marland Kitchen GLUCOSAMINE-CHONDROITIN PO Take 1 tablet by mouth 2 (two) times daily.   Marland Kitchen ibuprofen (ADVIL,MOTRIN) 200 MG tablet Take 200 mg by mouth every 6 (six) hours as needed for moderate pain.   Marland Kitchen levothyroxine (SYNTHROID, LEVOTHROID) 137 MCG tablet Take 137 mcg by mouth daily before breakfast.   . lisinopril (PRINIVIL,ZESTRIL) 10 MG tablet Take 10 mg by mouth daily.  . Magnesium 250 MG TABS Take 250 mg by mouth daily.   . methocarbamol (ROBAXIN) 500 MG tablet Take 500 mg by mouth 3 (three) times daily as needed for muscle spasms.   . metroNIDAZOLE (METROGEL) 0.75 % gel Apply 1 application topically 2 (two) times daily as needed (rosacea).   . Omega-3 1000 MG CAPS Take 1,000 mg by mouth 2 (two) times daily.  Vladimir Faster Glycol-Propyl Glycol (SYSTANE OP) Apply 1 drop to eye 2 (two) times daily as needed (dry eyes).   . raloxifene (EVISTA) 60 MG tablet Take 60 mg by mouth daily.    . sodium chloride (OCEAN) 0.65 % SOLN nasal spray Place 1 spray into both nostrils as needed for congestion.  . traMADol (ULTRAM) 50 MG tablet Take 1 tablet (50 mg total) by mouth every 6 (six) hours as needed.  . triamcinolone cream (KENALOG) 0.1 % Apply 1 application topically 2 (two) times daily as needed (itching).   . Turmeric 500 MG CAPS Take 500 mg by mouth daily.  . vitamin B-12 (CYANOCOBALAMIN) 1000 MCG tablet Take 1,000 mcg by mouth daily.    No facility-administered encounter medications on file as of 05/28/2019.     ALLERGIES:  Allergies  Allergen Reactions  . Mango Flavor Hives  . Erythromycin Nausea Only  . Ivp Dye [Iodinated Diagnostic Agents]   . Penicillins Itching    Did it involve swelling of the face/tongue/throat, SOB, or low BP? No Did it involve sudden or severe rash/hives, skin peeling, or any reaction on the inside of your mouth or nose? No Did you need to seek medical attention  at a hospital or doctor's office? No When did it last happen?10 years ago If all above answers are "NO", may proceed with cephalosporin use.   . Sulfonamide Derivatives Itching  . Neosporin [Bacitracin-Polymyxin B] Itching     PHYSICAL EXAM:  ECOG Performance status: 1  Vitals:   05/28/19 1204  BP: 128/66  Pulse: 89  Resp: 16  Temp: (!) 97.1 F (36.2 C)  SpO2: 99%  Filed Weights   05/28/19 1204  Weight: 171 lb 9.6 oz (77.8 kg)    Physical Exam Vitals signs reviewed.  Constitutional:      Appearance: Normal appearance.  Cardiovascular:     Rate and Rhythm: Normal rate and regular rhythm.     Heart sounds: Normal heart sounds.  Pulmonary:     Effort: Pulmonary effort is normal.     Breath sounds: Normal breath sounds.  Abdominal:     General: There is no distension.     Palpations: Abdomen is soft. There is no mass.  Musculoskeletal:        General: No swelling.  Lymphadenopathy:     Cervical: No cervical adenopathy.  Skin:    General: Skin is warm.  Neurological:     General: No focal deficit present.     Mental Status: She is alert and oriented to person, place, and time.  Psychiatric:        Mood and Affect: Mood normal.        Behavior: Behavior normal.     LABORATORY DATA:  I have reviewed the labs as listed.  CBC    Component Value Date/Time   WBC 12.0 (H) 05/26/2019 1802   RBC 4.54 05/26/2019 1802   HGB 13.4 05/26/2019 1802   HCT 43.2 05/26/2019 1802   PLT 288 05/26/2019 1802   MCV 95.2 05/26/2019 1802   MCH 29.5 05/26/2019 1802   MCHC 31.0 05/26/2019 1802   RDW 13.6 05/26/2019 1802   LYMPHSABS 2.9 05/26/2019 1802   MONOABS 1.1 (H) 05/26/2019 1802   EOSABS 0.3 05/26/2019 1802   BASOSABS 0.1 05/26/2019 1802   CMP Latest Ref Rng & Units 05/26/2019 05/17/2019 01/08/2010  Glucose 70 - 99 mg/dL 113(H) 85 97  BUN 8 - 23 mg/dL 24(H) 21 19  Creatinine 0.44 - 1.00 mg/dL 0.66 0.76 0.93  Sodium 135 - 145 mmol/L 138 137 140  Potassium 3.5 -  5.1 mmol/L 4.1 4.0 4.7  Chloride 98 - 111 mmol/L 101 100 101  CO2 22 - 32 mmol/L 26 28 29   Calcium 8.9 - 10.3 mg/dL 9.0 9.0 9.4  Total Protein 6.0 - 8.3 g/dL - - 6.8  Total Bilirubin 0.3 - 1.2 mg/dL - - 0.4  Alkaline Phos 39 - 117 units/L - - 75  AST 0 - 37 units/L - - 32  ALT 0 - 35 units/L - - 45(H)       DIAGNOSTIC IMAGING:  I have independently reviewed the scans and discussed with the patient.     ASSESSMENT & PLAN:   CARCINOMA IN SITU, RIGHT BREAST 1.  Right breast DCIS: -Diagnosed on 09/25/2008 after right breast biopsy, right breast lumpectomy and lymph node biopsy on 10/24/2008. -Pathology showed high-grade DCIS, negative margins, 1.9 cm, 0/4 lymph nodes positive, ER 99% positive, PR 59% positive, free margins, PT ISP N0. - She took Evista since then. - Recent ultrasound on 04/10/2019 showed right axillary lymph node, ultrasound-guided core biopsy shows reactive lymph node. - Right axillary lymph node excision biopsy on 05/21/2019 shows reactive lymphoid hyperplasia with sinus histiocytes. - I have told her to discontinue Evista.  She will be seen back in 1 year with repeat mammogram.  2.  Osteopenia: -DEXA scan on 07/17/2018 shows T score of -1.4.  This is stable compared to DEXA scan in 2017. - She was counseled to take calcium and vitamin D supplements.  3.  Elevated liver enzymes: -She has elevated AST and ALT, mild at 61  and 36 respectively.  She had elevated LFTs last year also. - CT of the abdomen from 2011 showed mild diffuse fatty infiltration of the liver which could be contributing to enzyme elevation.  Total time spent is 25 minutes with more than 50% of the time spent face-to-face discussing biopsy results, further plan, counseling and coordination of care.    Orders placed this encounter:  Orders Placed This Encounter  Procedures  . MM 3D SCREEN BREAST UNI LEFT  . CBC with Differential/Platelet  . Comprehensive metabolic panel  . CA 125  . Vitamin D  25 hydroxy      Derek Jack, MD [place] [phone]

## 2019-05-29 ENCOUNTER — Other Ambulatory Visit: Payer: Self-pay

## 2019-05-29 ENCOUNTER — Ambulatory Visit (INDEPENDENT_AMBULATORY_CARE_PROVIDER_SITE_OTHER): Payer: Self-pay | Admitting: General Surgery

## 2019-05-29 ENCOUNTER — Encounter: Payer: Self-pay | Admitting: General Surgery

## 2019-05-29 ENCOUNTER — Ambulatory Visit (HOSPITAL_COMMUNITY): Payer: Medicare HMO | Admitting: Hematology

## 2019-05-29 VITALS — BP 119/71 | HR 95 | Temp 98.2°F | Resp 16 | Ht 65.0 in | Wt 173.0 lb

## 2019-05-29 DIAGNOSIS — Z09 Encounter for follow-up examination after completed treatment for conditions other than malignant neoplasm: Secondary | ICD-10-CM

## 2019-05-29 NOTE — Progress Notes (Signed)
Subjective:     Maria Ritter  Here for postoperative visit.  Had a 36 cc seroma drained from the right axilla 4 days ago.  Some swelling has recurred.  She denies any fevers. Objective:    BP 119/71 (BP Location: Left Arm, Patient Position: Sitting, Cuff Size: Normal)   Pulse 95   Temp 98.2 F (36.8 C) (Tympanic)   Resp 16   Ht 5\' 5"  (1.651 m)   Wt 173 lb (78.5 kg)   SpO2 94%   BMI 28.79 kg/m   General:  alert, cooperative and no distress  20 cc seroma aspirated from right axilla.  Incision well-healed.  Resolving ecchymosis present.     Assessment:    Postoperative course complicated by Seroma    Plan:   We will see again in 1 week for wound check.  Will have her stop baby aspirin for now.

## 2019-05-31 ENCOUNTER — Encounter: Payer: Self-pay | Admitting: General Surgery

## 2019-05-31 ENCOUNTER — Ambulatory Visit (INDEPENDENT_AMBULATORY_CARE_PROVIDER_SITE_OTHER): Payer: Self-pay | Admitting: General Surgery

## 2019-05-31 ENCOUNTER — Other Ambulatory Visit: Payer: Self-pay

## 2019-05-31 VITALS — BP 128/79 | HR 74 | Temp 96.9°F | Resp 18 | Ht 65.0 in | Wt 173.0 lb

## 2019-05-31 DIAGNOSIS — I739 Peripheral vascular disease, unspecified: Secondary | ICD-10-CM | POA: Diagnosis not present

## 2019-05-31 DIAGNOSIS — Z09 Encounter for follow-up examination after completed treatment for conditions other than malignant neoplasm: Secondary | ICD-10-CM

## 2019-05-31 MED ORDER — DOXYCYCLINE HYCLATE 50 MG PO CAPS: 100.0000 mg | ORAL_CAPSULE | Freq: Two times a day (BID) | ORAL | 0 refills | Status: DC

## 2019-05-31 NOTE — Progress Notes (Signed)
Subjective:     Maria Ritter  Patient presented for unexpected visit due to increased swelling in the right axilla. Objective:    BP 128/79 (BP Location: Left Arm, Patient Position: Sitting, Cuff Size: Normal)   Pulse 74   Temp (!) 96.9 F (36.1 C) (Tympanic)   Resp 18   Ht 5\' 5"  (1.651 m)   Wt 173 lb (78.5 kg)   SpO2 97%   BMI 28.79 kg/m   General:  alert, cooperative and no distress  Right axilla is erythematous with swelling present.  10 cc of cloudy seroma aspirated.  No frank purulent fluid noted.     Assessment:    Postoperative course complicated by Seroma    Plan:   Will place on doxycycline 100 mg p.o. twice daily x1 week due to the increased erythema.  She is to follow-up with me in 5 days as previously scheduled.

## 2019-06-05 ENCOUNTER — Encounter: Payer: Self-pay | Admitting: General Surgery

## 2019-06-05 ENCOUNTER — Ambulatory Visit (INDEPENDENT_AMBULATORY_CARE_PROVIDER_SITE_OTHER): Payer: Self-pay | Admitting: General Surgery

## 2019-06-05 ENCOUNTER — Other Ambulatory Visit: Payer: Self-pay

## 2019-06-05 VITALS — BP 127/77 | HR 61 | Temp 96.8°F | Resp 16 | Ht 65.0 in | Wt 175.0 lb

## 2019-06-05 DIAGNOSIS — Z09 Encounter for follow-up examination after completed treatment for conditions other than malignant neoplasm: Secondary | ICD-10-CM

## 2019-06-05 NOTE — Progress Notes (Signed)
Subjective:     Maria Ritter  Postoperative visit for wound check.  States that less drainage has been noted from the right axilla.  Less pain is also noted.  No fevers have been noted.  She is taking doxycycline. Objective:    BP 127/77 (BP Location: Left Arm, Patient Position: Sitting, Cuff Size: Normal)   Pulse 61   Temp (!) 96.8 F (36 C) (Tympanic)   Resp 16   Ht 5\' 5"  (1.651 m)   Wt 175 lb (79.4 kg)   SpO2 93%   BMI 29.12 kg/m   General:  alert, cooperative and no distress  Right axilla less swollen.  No drainage noted.  Less erythema present.  No significant seroma noted.     Assessment:    Right axillary wound seroma, resolving    Plan:   Finish antibiotic course.  Follow-up here in 2 weeks for wound check.

## 2019-06-13 ENCOUNTER — Telehealth: Payer: Self-pay | Admitting: Adult Health

## 2019-06-13 NOTE — Telephone Encounter (Signed)
Tried to reach the patient to remind her of her appointment/restrictions, no answer or machine.

## 2019-06-14 ENCOUNTER — Ambulatory Visit (INDEPENDENT_AMBULATORY_CARE_PROVIDER_SITE_OTHER): Payer: Medicare HMO | Admitting: Adult Health

## 2019-06-14 ENCOUNTER — Encounter: Payer: Self-pay | Admitting: Adult Health

## 2019-06-14 ENCOUNTER — Other Ambulatory Visit (HOSPITAL_COMMUNITY)
Admission: RE | Admit: 2019-06-14 | Discharge: 2019-06-14 | Disposition: A | Discharge status: Home / Self Care | Payer: Medicare HMO | Source: Ambulatory Visit | Attending: Adult Health | Admitting: Adult Health

## 2019-06-14 ENCOUNTER — Other Ambulatory Visit: Payer: Self-pay

## 2019-06-14 VITALS — BP 135/72 | HR 61 | Ht 63.0 in | Wt 173.5 lb

## 2019-06-14 DIAGNOSIS — Z1151 Encounter for screening for human papillomavirus (HPV): Secondary | ICD-10-CM | POA: Insufficient documentation

## 2019-06-14 DIAGNOSIS — Z853 Personal history of malignant neoplasm of breast: Secondary | ICD-10-CM

## 2019-06-14 DIAGNOSIS — Z124 Encounter for screening for malignant neoplasm of cervix: Secondary | ICD-10-CM | POA: Insufficient documentation

## 2019-06-14 DIAGNOSIS — Z1211 Encounter for screening for malignant neoplasm of colon: Secondary | ICD-10-CM | POA: Diagnosis not present

## 2019-06-14 DIAGNOSIS — Z01419 Encounter for gynecological examination (general) (routine) without abnormal findings: Secondary | ICD-10-CM

## 2019-06-14 DIAGNOSIS — Z780 Asymptomatic menopausal state: Secondary | ICD-10-CM | POA: Diagnosis not present

## 2019-06-14 DIAGNOSIS — Z1212 Encounter for screening for malignant neoplasm of rectum: Secondary | ICD-10-CM

## 2019-06-14 DIAGNOSIS — R238 Other skin changes: Secondary | ICD-10-CM | POA: Insufficient documentation

## 2019-06-14 DIAGNOSIS — R69 Illness, unspecified: Secondary | ICD-10-CM | POA: Diagnosis not present

## 2019-06-14 LAB — HEMOCCULT GUIAC POC 1CARD (OFFICE): Fecal Occult Blood, POC: NEGATIVE

## 2019-06-14 MED ORDER — SILVER SULFADIAZINE 1 % EX CREA: 1.0000 "application " | TOPICAL_CREAM | Freq: Every day | CUTANEOUS | 0 refills | Status: DC

## 2019-06-14 NOTE — Progress Notes (Signed)
Patient ID: BREZLYN TROUPE, female   DOB: 1946/07/18, 73 y.o.   MRN: [id number] History of Present Illness: Aspasia is a 73 year old white female, widowed, PM with history of breast cancer, in for a well woman gyn exam and pap. On 9/21 had lymph node removed, and then seen in ER 9/26 for pain and redness and had  Fluid removed 3 times and then placed antibiotics.  PCP is Dr Nevada Crane.   Current Medications, Allergies, Past Medical History, Past Surgical History, Family History and Social History were reviewed in [employer] record.     Review of Systems: Patient denies any headaches, hearing loss, fatigue, blurred vision, shortness of breath, chest pain, abdominal pain, problems with bowel movements, urination, or intercourse(not active). No joint pain or mood swings.    Physical Exam:BP 135/72 (BP Location: Left Arm, Patient Position: Sitting, Cuff Size: Normal)   Pulse 61   Ht 5\' 3"  (1.6 m)   Wt 173 lb 8 oz (78.7 kg)   BMI 30.73 kg/m  General:  Well developed, well nourished, no acute distress Skin:  Warm and dry Neck:  Midline trachea, normal thyroid, good ROM, no lymphadenopathy,no carotid bruits heard  Lungs; Clear to auscultation bilaterally Breast:  No dominant palpable mass, retraction, or nipple discharge on the left, has retraction right breast where had surgery, no masses or nipple discharge, has redness and swelling right underarm, non tender, co exam with Dr Elonda Husky, will add silvadene, jsut finished doxycyclone Cardiovascular: Regular rate and rhythm Abdomen:  Soft, non tender, no hepatosplenomegaly Pelvic:  External genitalia is normal in appearance, no lesions.  The vagina is atrophic. Urethra has no lesions or masses. The cervix is smooth, atrophic, pap with HPV performed.  Uterus is felt to be normal size, shape, and contour.  No adnexal masses or tenderness noted.Bladder is non tender, no masses felt. Rectal: Good sphincter tone, no polyps, or hemorrhoids  felt.  Hemoccult negative. Extremities/musculoskeletal:  No swelling + varicosities noted, no clubbing or cyanosis Psych:  No mood changes, alert and cooperative,seems happy Fall risk is low PHQ 2 is 0.   Impression and Plan:   1. Routine cervical smear   2. Encounter for gynecological examination with Papanicolaou smear of cervix -pap sent  -physical in 2 years  Labs with PCP  3. Encounter for colorectal cancer screening   4. History of breast cancer in female -see Dr Arnoldo Morale next week in follow up - Meds ordered this encounter  Medications  . silver sulfADIAZINE (SILVADENE) 1 % cream    Sig: Apply 1 application topically daily.    Dispense:  25 g    Refill:  0    Order Specific Question:   Supervising Provider    Answer:   EURE, LUTHER H [[id number]]    5. Postinflammatory skin changes -will try silvadene

## 2019-06-19 ENCOUNTER — Encounter: Payer: Self-pay | Admitting: General Surgery

## 2019-06-19 ENCOUNTER — Other Ambulatory Visit: Payer: Self-pay

## 2019-06-19 ENCOUNTER — Ambulatory Visit (INDEPENDENT_AMBULATORY_CARE_PROVIDER_SITE_OTHER): Payer: Self-pay | Admitting: General Surgery

## 2019-06-19 VITALS — BP 124/73 | HR 71 | Temp 96.8°F | Resp 16 | Ht 63.0 in | Wt 174.0 lb

## 2019-06-19 DIAGNOSIS — Z09 Encounter for follow-up examination after completed treatment for conditions other than malignant neoplasm: Secondary | ICD-10-CM

## 2019-06-19 NOTE — Progress Notes (Signed)
Subjective:     Maria Ritter  Here for follow-up wound check.  Patient states the right axillary swelling has significantly decreased.  Has intermittent tenderness in the region.  No drainage has been noted. Objective:    BP 124/73 (BP Location: Left Arm, Patient Position: Sitting, Cuff Size: Normal)   Pulse 71   Temp (!) 96.8 F (36 C) (Tympanic)   Resp 16   Ht 5\' 3"  (1.6 m)   Wt 174 lb (78.9 kg)   SpO2 95%   BMI 30.82 kg/m   General:  alert, cooperative and no distress  Right axilla seroma has resolved.  Small knot noted within right axilla.  Incision well-healed.     Assessment:    Right axillary seroma/hematoma, resolved    Plan:   Reassured patient that she was continuing to heal well.  I told her to call me in 1 month should she have any questions.  She understands and agrees.

## 2019-06-21 LAB — CYTOLOGY - PAP
Comment: NEGATIVE
Diagnosis: NEGATIVE
High risk HPV: NEGATIVE

## 2019-06-25 ENCOUNTER — Telehealth: Payer: Self-pay | Admitting: Adult Health

## 2019-06-25 DIAGNOSIS — R945 Abnormal results of liver function studies: Secondary | ICD-10-CM | POA: Diagnosis not present

## 2019-06-25 DIAGNOSIS — E782 Mixed hyperlipidemia: Secondary | ICD-10-CM | POA: Diagnosis not present

## 2019-06-25 DIAGNOSIS — L821 Other seborrheic keratosis: Secondary | ICD-10-CM | POA: Diagnosis not present

## 2019-06-25 DIAGNOSIS — I2510 Atherosclerotic heart disease of native coronary artery without angina pectoris: Secondary | ICD-10-CM | POA: Diagnosis not present

## 2019-06-25 DIAGNOSIS — L570 Actinic keratosis: Secondary | ICD-10-CM | POA: Diagnosis not present

## 2019-06-25 DIAGNOSIS — I10 Essential (primary) hypertension: Secondary | ICD-10-CM | POA: Diagnosis not present

## 2019-06-25 DIAGNOSIS — J309 Allergic rhinitis, unspecified: Secondary | ICD-10-CM | POA: Diagnosis not present

## 2019-06-25 DIAGNOSIS — R7301 Impaired fasting glucose: Secondary | ICD-10-CM | POA: Diagnosis not present

## 2019-06-25 DIAGNOSIS — E039 Hypothyroidism, unspecified: Secondary | ICD-10-CM | POA: Diagnosis not present

## 2019-06-25 DIAGNOSIS — D1801 Hemangioma of skin and subcutaneous tissue: Secondary | ICD-10-CM | POA: Diagnosis not present

## 2019-06-25 DIAGNOSIS — L280 Lichen simplex chronicus: Secondary | ICD-10-CM | POA: Diagnosis not present

## 2019-07-10 DIAGNOSIS — I10 Essential (primary) hypertension: Secondary | ICD-10-CM | POA: Diagnosis not present

## 2019-07-10 DIAGNOSIS — M1711 Unilateral primary osteoarthritis, right knee: Secondary | ICD-10-CM | POA: Diagnosis not present

## 2019-07-10 DIAGNOSIS — Z6829 Body mass index (BMI) 29.0-29.9, adult: Secondary | ICD-10-CM | POA: Diagnosis not present

## 2019-07-10 DIAGNOSIS — R7301 Impaired fasting glucose: Secondary | ICD-10-CM | POA: Diagnosis not present

## 2019-07-10 DIAGNOSIS — M62838 Other muscle spasm: Secondary | ICD-10-CM | POA: Diagnosis not present

## 2019-07-10 DIAGNOSIS — M353 Polymyalgia rheumatica: Secondary | ICD-10-CM | POA: Diagnosis not present

## 2019-07-10 DIAGNOSIS — R05 Cough: Secondary | ICD-10-CM | POA: Diagnosis not present

## 2019-07-10 DIAGNOSIS — Z20828 Contact with and (suspected) exposure to other viral communicable diseases: Secondary | ICD-10-CM | POA: Diagnosis not present

## 2019-07-10 DIAGNOSIS — Z23 Encounter for immunization: Secondary | ICD-10-CM | POA: Diagnosis not present

## 2019-07-10 DIAGNOSIS — L603 Nail dystrophy: Secondary | ICD-10-CM | POA: Diagnosis not present

## 2019-07-10 DIAGNOSIS — Z0001 Encounter for general adult medical examination with abnormal findings: Secondary | ICD-10-CM | POA: Diagnosis not present

## 2019-07-10 DIAGNOSIS — R509 Fever, unspecified: Secondary | ICD-10-CM | POA: Diagnosis not present

## 2019-07-16 DIAGNOSIS — L280 Lichen simplex chronicus: Secondary | ICD-10-CM | POA: Diagnosis not present

## 2019-07-16 DIAGNOSIS — L309 Dermatitis, unspecified: Secondary | ICD-10-CM | POA: Diagnosis not present

## 2019-07-16 DIAGNOSIS — L728 Other follicular cysts of the skin and subcutaneous tissue: Secondary | ICD-10-CM | POA: Diagnosis not present

## 2019-07-18 DIAGNOSIS — I10 Essential (primary) hypertension: Secondary | ICD-10-CM | POA: Diagnosis not present

## 2019-07-18 DIAGNOSIS — E039 Hypothyroidism, unspecified: Secondary | ICD-10-CM | POA: Diagnosis not present

## 2019-07-18 DIAGNOSIS — E782 Mixed hyperlipidemia: Secondary | ICD-10-CM | POA: Diagnosis not present

## 2019-07-18 DIAGNOSIS — J309 Allergic rhinitis, unspecified: Secondary | ICD-10-CM | POA: Diagnosis not present

## 2019-07-18 DIAGNOSIS — I2510 Atherosclerotic heart disease of native coronary artery without angina pectoris: Secondary | ICD-10-CM | POA: Diagnosis not present

## 2019-07-18 DIAGNOSIS — R945 Abnormal results of liver function studies: Secondary | ICD-10-CM | POA: Diagnosis not present

## 2019-07-18 DIAGNOSIS — R7301 Impaired fasting glucose: Secondary | ICD-10-CM | POA: Diagnosis not present

## 2019-07-20 DIAGNOSIS — M25562 Pain in left knee: Secondary | ICD-10-CM | POA: Diagnosis not present

## 2019-08-02 ENCOUNTER — Ambulatory Visit: Payer: Medicare HMO | Admitting: Cardiology

## 2019-08-02 ENCOUNTER — Other Ambulatory Visit: Payer: Self-pay

## 2019-08-02 ENCOUNTER — Encounter: Payer: Self-pay | Admitting: Cardiology

## 2019-08-02 VITALS — BP 112/65 | HR 97 | Temp 96.9°F | Ht 65.0 in | Wt 176.0 lb

## 2019-08-02 DIAGNOSIS — R9439 Abnormal result of other cardiovascular function study: Secondary | ICD-10-CM | POA: Diagnosis not present

## 2019-08-02 DIAGNOSIS — I6523 Occlusion and stenosis of bilateral carotid arteries: Secondary | ICD-10-CM

## 2019-08-02 DIAGNOSIS — E782 Mixed hyperlipidemia: Secondary | ICD-10-CM | POA: Diagnosis not present

## 2019-08-02 DIAGNOSIS — I10 Essential (primary) hypertension: Secondary | ICD-10-CM

## 2019-08-02 NOTE — Patient Instructions (Signed)
Medication Instructions:  Your physician recommends that you continue on your current medications as directed. Please refer to the Current Medication list given to you today.  *If you need a refill on your cardiac medications before your next appointment, please call your pharmacy*  Lab Work: None today If you have labs (blood work) drawn today and your tests are completely normal, you will receive your results only by: . MyChart Message (if you have MyChart) OR . A paper copy in the mail If you have any lab test that is abnormal or we need to change your treatment, we will call you to review the results.  Testing/Procedures: None today  Follow-Up: At [facility], you and your health needs are our priority.  As part of our continuing mission to provide you with exceptional heart care, we have created designated Provider Care Teams.  These Care Teams include your primary Cardiologist (physician) and Advanced Practice Providers (APPs -  Physician Assistants and Nurse Practitioners) who all work together to provide you with the care you need, when you need it.  Your next appointment:   12 month(s)  The format for your next appointment:   In Person  Provider:   Samuel McDowell, MD  Other Instructions None     Thank you for choosing [facility] !        

## 2019-08-02 NOTE — Progress Notes (Signed)
Cardiology Office Note  Date: 08/02/2019   ID: Maria Ritter 03/22/1946, MRN [id number]  PCP:  Celene Squibb, MD  Cardiologist:  Rozann Lesches, MD Electrophysiologist:  None   Chief Complaint  Patient presents with  . Cardiac follow-up    History of Present Illness: Maria Ritter is a 73 y.o. female last seen in December 2019.  She presents for a routine visit.  She tells me that she has had no major health changes in the last year, has been social distancing and wearing a mask during the pandemic.  She did start back to maintenance cardiac rehabilitation.  She reports stable NYHA class II dyspnea, no palpitations or chest pain.  She does not report any claudication symptoms.  She does have spider veins and some venous pooling, was concerned about PAD but decided not to undergo noninvasive imaging studies at this point.  She has not reporting any exercise limitations related to her legs.  Carotid Dopplers from December of last year showed overall stable, mild to moderate ICA stenosis.  She is asymptomatic.  She remains on aspirin.  Most recent lipid panel showed LDL 84.  As noted previously, she has preferred not to pursue statin therapy.  She continues to follow with Dr. Nevada Crane and has a visit with lab work scheduled after the turn of the year.  I reviewed her most recent ECG as outlined below.  Past Medical History:  Diagnosis Date  . Breast cancer ([place]) 2010   Right Lumpectomy and XRT  . Carotid artery disease (HCC)    Mild to moderate bilateral ICA disease, left greater than right 07/2015  . Essential hypertension   . GERD (gastroesophageal reflux disease)   . Glaucoma   . Hyperlipidemia   . Hypothyroidism   . Osteoarthritis   . Polymyalgia rheumatica (HCC)     Past Surgical History:  Procedure Laterality Date  . AXILLARY LYMPH NODE BIOPSY Right 05/21/2019   Procedure: RIGHT AXILLARY LYMPH NODE BIOPSY;  Surgeon: Aviva Signs, MD;  Location: [facility];  Service:  General;  Laterality: Right;  . BASAL CELL CARCINOMA EXCISION  2010   Right breast  . BREAST LUMPECTOMY Right 2010  . ENDOMETRIAL ABLATION  2002  . FINGER SURGERY     Minor surgical intervention following a crush injury to the left fourth finger  . TOOTH EXTRACTION  01/2019    Current Outpatient Medications  Medication Sig Dispense Refill  . amLODipine (NORVASC) 5 MG tablet Take 5 mg by mouth daily.      Marland Kitchen aspirin 81 MG tablet Take 81 mg by mouth daily.      Marland Kitchen BIOTIN PO Take 5,000 mcg by mouth daily.    Marland Kitchen CALCIUM CITRATE-VITAMIN D PO Take 1 tablet by mouth daily.     . Cholecalciferol (VITAMIN D3 PO) Take 2,000 Units by mouth 2 (two) times daily.    . fexofenadine (ALLEGRA) 180 MG tablet Take 180 mg by mouth daily as needed for allergies or rhinitis.    Marland Kitchen GLUCOSAMINE-CHONDROITIN PO Take 1 tablet by mouth 2 (two) times daily.     Marland Kitchen ibuprofen (ADVIL,MOTRIN) 200 MG tablet Take 200 mg by mouth every 6 (six) hours as needed for moderate pain.     Marland Kitchen levothyroxine (SYNTHROID, LEVOTHROID) 137 MCG tablet Take 137 mcg by mouth daily before breakfast.   1  . lisinopril (PRINIVIL,ZESTRIL) 10 MG tablet Take 10 mg by mouth daily.    . Magnesium 250 MG TABS Take 250  mg by mouth daily.     . methocarbamol (ROBAXIN) 500 MG tablet Take 500 mg by mouth 3 (three) times daily as needed for muscle spasms.     . metroNIDAZOLE (METROGEL) 0.75 % gel Apply 1 application topically 2 (two) times daily as needed (rosacea).     . Omega-3 1000 MG CAPS Take 1,000 mg by mouth 2 (two) times daily.    Vladimir Faster Glycol-Propyl Glycol (SYSTANE OP) Apply 1 drop to eye 2 (two) times daily as needed (dry eyes).     . silver sulfADIAZINE (SILVADENE) 1 % cream Apply 1 application topically daily. 25 g 0  . sodium chloride (OCEAN) 0.65 % SOLN nasal spray Place 1 spray into both nostrils as needed for congestion.    . triamcinolone ointment (KENALOG) 0.5 % Apply 1 application topically 2 (two) times daily.    . Turmeric 500 MG  CAPS Take 500 mg by mouth daily.    . vitamin B-12 (CYANOCOBALAMIN) 1000 MCG tablet Take 500 mcg by mouth daily.     No current facility-administered medications for this visit.    Allergies:  Mango flavor, Erythromycin, Ivp dye [iodinated diagnostic agents], Penicillins, Sulfonamide derivatives, Neosporin [bacitracin-polymyxin b], and Tape   Social History: The patient  reports that she has never smoked. She has never used smokeless tobacco. She reports that she does not drink alcohol or use drugs.   ROS:  Please see the history of present illness. Otherwise, complete review of systems is positive for none.  All other systems are reviewed and negative.   Physical Exam: VS:  BP 112/65   Pulse 97   Temp (!) 96.9 F (36.1 C)   Ht 5\' 5"  (1.651 m)   Wt 176 lb (79.8 kg)   SpO2 92%   BMI 29.29 kg/m , BMI Body mass index is 29.29 kg/m.  Wt Readings from Last 3 Encounters:  08/02/19 176 lb (79.8 kg)  06/19/19 174 lb (78.9 kg)  06/14/19 173 lb 8 oz (78.7 kg)    General: Patient appears comfortable at rest. HEENT: Conjunctiva and lids normal, wearing a mask. Neck: Supple, no elevated JVP or carotid bruits, no thyromegaly. Lungs: Clear to auscultation, nonlabored breathing at rest. Cardiac: Regular rate and rhythm with intermittent ectopy, no S3 or significant systolic murmur. Abdomen: Soft, nontender, bowel sounds present. Extremities: Distal venous stasis and spider veins, distal pulses 2+. Skin: Warm and dry. Musculoskeletal: No kyphosis. Neuropsychiatric: Alert and oriented x3, affect grossly appropriate.  ECG:  An ECG dated 05/17/2019 was personally reviewed today and demonstrated:  Sinus rhythm with poor R wave progression.  Recent Labwork: 05/26/2019: BUN 24; Creatinine, Ser 0.66; Hemoglobin 13.4; Platelets 288; Potassium 4.1; Sodium 138  July 2020: Hemoglobin 12.9, platelets 246, BUN 17, creatinine 0.71, potassium 4.5, AST 61, ALT 36, triglycerides 132, HDL 37, LDL 84,  hemoglobin A1c 5.7%, TSH 0.98  Other Studies Reviewed Today:  Carotid Dopplers 08/21/2018: IMPRESSION: 1. Mild (1-49%) stenosis proximal right internal carotid artery secondary to heterogenous atherosclerotic plaque. 2. Mild (1-49%) stenosis proximal left internal carotid artery secondary to heterogenous atherosclerotic plaque. 3. No significant interval progression of internal carotid artery disease compared to 08/18/2015. 4. Vertebral arteries are patent with normal antegrade flow.  Exercise Cardiolite 10/23/2015:  Horizontal ST segment depression ST segment depression was noted during stress in the II leads.  Defect 1: There is a medium defect of mild severity present in the mid inferolateral location. It is non-reversible and while it is most likely due to soft  tissue attenuation artifact, scar cannot entirely be excluded given the corresponding ECG abnormalities.  This is a low risk study.  Nuclear stress EF: 63%.  Assessment and Plan:  1.  Asymptomatic, mild to moderate carotid artery disease.  She is on aspirin.  2.  Mixed hyperlipidemia.  She maintains regular exercise and remains on omega-3 supplements. She has preferred not to pursue statin therapy over time.  Most recent LDL was 84.  3.  Essential hypertension, blood pressure is normal today.  She remains on Norvasc and lisinopril.  4.  Previous abnormal Myoview without active angina symptoms.  Continue observation and medical therapy.  ECG from September reviewed.  Medication Adjustments/Labs and Tests Ordered: Current medicines are reviewed at length with the patient today.  Concerns regarding medicines are outlined above.   Tests Ordered: No orders of the defined types were placed in this encounter.   Medication Changes: No orders of the defined types were placed in this encounter.   Disposition:  Follow up 1 year in the [facility] office.  Signed, Satira Sark, MD, [facility] 08/02/2019 10:53 AM    [facility]. [address] [place], [place] [address] Phone: [phone]; Fax: [phone]

## 2019-08-02 NOTE — Progress Notes (Deleted)
Cardiology Office Note  Date: 08/02/2019   ID: Maria Ritter, DOB 1946/02/04, MRN [id number]  PCP:  Celene Squibb, MD  Cardiologist:  No primary care provider on file. Electrophysiologist:  None   No chief complaint on file.   History of Present Illness: Maria Ritter is a 73 y.o. female with history of abnormal Myoview stress test and history of hypertension, carotid artery disease.  Patient was last seen in office in December 2019 for routine visit.  At that time she was exercising and her cardio at rehab maintenance program and admitted that this had significantly improved her shortness of breath and she had fewer arthritic symptoms.  At that time she denied any anginal symptoms.  Recent repeat carotid Doppler study showed mild disease in bilateral internal carotid arteries of 1 to 49%.  There had been no significant change since previous study in 2016.  She had bilateral antegrade flow in both vertebral arteries.  Past Medical History:  Diagnosis Date  . Breast cancer ([place]) 2010   Right Lumpectomy and XRT  . Carotid artery disease (HCC)    Mild to moderate bilateral ICA disease, left greater than right 07/2015  . Complication of anesthesia   . Essential hypertension   . GERD (gastroesophageal reflux disease)   . Glaucoma   . Hyperlipidemia   . Hypothyroidism   . Osteoarthritis   . Polymyalgia rheumatica ([place])   . PONV (postoperative nausea and vomiting)     Past Surgical History:  Procedure Laterality Date  . AXILLARY LYMPH NODE BIOPSY Right 05/21/2019   Procedure: RIGHT AXILLARY LYMPH NODE BIOPSY;  Surgeon: Aviva Signs, MD;  Location: [facility];  Service: General;  Laterality: Right;  . BASAL CELL CARCINOMA EXCISION  2010   Right breast  . BREAST LUMPECTOMY Right 2010  . ENDOMETRIAL ABLATION  2002  . FINGER SURGERY     Minor surgical intervention following a crush injury to the left fourth finger  . TOOTH EXTRACTION  01/2019    Current Outpatient Medications   Medication Sig Dispense Refill  . amLODipine (NORVASC) 5 MG tablet Take 5 mg by mouth daily.      Marland Kitchen aspirin 81 MG tablet Take 81 mg by mouth daily.      Marland Kitchen BIOTIN PO Take 5,000 mcg by mouth daily.    Marland Kitchen CALCIUM CITRATE-VITAMIN D PO Take 1 tablet by mouth daily.     . Cholecalciferol (VITAMIN D3 PO) Take 2,000 Units by mouth 2 (two) times daily.    . fexofenadine (ALLEGRA) 180 MG tablet Take 180 mg by mouth daily as needed for allergies or rhinitis.    Marland Kitchen GLUCOSAMINE-CHONDROITIN PO Take 1 tablet by mouth 2 (two) times daily.     Marland Kitchen ibuprofen (ADVIL,MOTRIN) 200 MG tablet Take 200 mg by mouth every 6 (six) hours as needed for moderate pain.     Marland Kitchen levothyroxine (SYNTHROID, LEVOTHROID) 137 MCG tablet Take 137 mcg by mouth daily before breakfast.   1  . lisinopril (PRINIVIL,ZESTRIL) 10 MG tablet Take 10 mg by mouth daily.    . Magnesium 250 MG TABS Take 250 mg by mouth daily.     . methocarbamol (ROBAXIN) 500 MG tablet Take 500 mg by mouth 3 (three) times daily as needed for muscle spasms.     . metroNIDAZOLE (METROGEL) 0.75 % gel Apply 1 application topically 2 (two) times daily as needed (rosacea).     . Omega-3 1000 MG CAPS Take 1,000 mg by mouth 2 (two)  times daily.    Vladimir Faster Glycol-Propyl Glycol (SYSTANE OP) Apply 1 drop to eye 2 (two) times daily as needed (dry eyes).     . raloxifene (EVISTA) 60 MG tablet Take 60 mg by mouth daily.      . silver sulfADIAZINE (SILVADENE) 1 % cream Apply 1 application topically daily. 25 g 0  . sodium chloride (OCEAN) 0.65 % SOLN nasal spray Place 1 spray into both nostrils as needed for congestion.    . triamcinolone ointment (KENALOG) 0.5 % Apply 1 application topically 2 (two) times daily.    . Turmeric 500 MG CAPS Take 500 mg by mouth daily.    . vitamin B-12 (CYANOCOBALAMIN) 1000 MCG tablet Take 500 mcg by mouth daily.     No current facility-administered medications for this visit.    Allergies:  Mango flavor, Erythromycin, Ivp dye [iodinated  diagnostic agents], Penicillins, Sulfonamide derivatives, Neosporin [bacitracin-polymyxin b], and Tape   Social History: The patient  reports that she has never smoked. She has never used smokeless tobacco. She reports that she does not drink alcohol or use drugs.   Family History: The patient's family history includes Heart failure in her father; Parkinsonism in her mother; Pneumonia in her mother.   ROS:  Please see the history of present illness. Otherwise, complete review of systems is positive for {NONE DEFAULTED:18576::"none"}.  All other systems are reviewed and negative.   Physical Exam: VS:  There were no vitals taken for this visit., BMI There is no height or weight on file to calculate BMI.  Wt Readings from Last 3 Encounters:  06/19/19 174 lb (78.9 kg)  06/14/19 173 lb 8 oz (78.7 kg)  06/05/19 175 lb (79.4 kg)    General: Patient appears comfortable at rest. HEENT: Conjunctiva and lids normal, oropharynx clear with moist mucosa. Neck: Supple, no elevated JVP or carotid bruits, no thyromegaly. Lungs: Clear to auscultation, nonlabored breathing at rest. Cardiac: Regular rate and rhythm, no S3 or significant systolic murmur, no pericardial rub. Abdomen: Soft, nontender, no hepatomegaly, bowel sounds present, no guarding or rebound. Extremities: No pitting edema, distal pulses 2+. Skin: Warm and dry. Musculoskeletal: No kyphosis. Neuropsychiatric: Alert and oriented x3, affect grossly appropriate.  ECG:  An ECG dated May 17, 2019 was personally reviewed today and demonstrated:  Sinus rhythm with premature atrial complexes in a pattern of bigeminy, low voltage.  Heart rate of 82  Recent Labwork: 05/26/2019: BUN 24; Creatinine, Ser 0.66; Hemoglobin 13.4; Platelets 288; Potassium 4.1; Sodium 138     Component Value Date/Time   CHOL 157 01/30/2010 2016   TRIG 135 01/30/2010 2016   HDL 42 01/30/2010 2016   CHOLHDL 3.7 Ratio 01/30/2010 2016   VLDL 27 01/30/2010 2016    [place] 88 01/30/2010 2016    Other Studies Reviewed Today: Exercise Cardiolite 10/23/2015:  Horizontal ST segment depression ST segment depression was noted during stress in the II leads.  Defect 1: There is a medium defect of mild severity present in the mid inferolateral location. It is non-reversible and while it is most likely due to soft tissue attenuation artifact, scar cannot entirely be excluded given the corresponding ECG abnormalities.  This is a low risk study.  Nuclear stress EF: 63%.   Carotid Doppler 05 August 2018 IMPRESSION: 1. Mild (1-49%) stenosis proximal right internal carotid artery secondary to heterogenous atherosclerotic plaque. 2. Mild (1-49%) stenosis proximal left internal carotid artery secondary to heterogenous atherosclerotic plaque. 3. No significant interval progression of internal carotid artery  disease compared to 08/18/2015. 4. Vertebral arteries are patent with normal antegrade flow.  Assessment and Plan:  1. Essential hypertension   2. Abnormal stress test   3. Bilateral carotid artery stenosis    1. Essential hypertension History of hypertension on lisinopril 10 mg daily.  Last recorded blood pressure on June 19, 2019 was 124/73.  2. Abnormal stress test Patient had a previous abnormal nuclear Myoview stress test which showed a medium defect of mild severity present in the mid inferior lateral location.  It is nonreversible and most likely due to soft tissue attenuation artifact.  Scar could not be entirely excluded given corresponding EKG abnormalities.  This was considered a low risk study.  3. Bilateral carotid artery stenosis Recent carotid Doppler study showed mild 1 to 49% stenosis in the proximal right internal carotid artery.  Mild 1 to 49% stenosis proximal left internal carotid artery.  There was no significant interval progression of internal carotid disease compared to previous study of 2016.  Both vertebral arteries  demonstrated antegrade flow.  Medication Adjustments/Labs and Tests Ordered: Current medicines are reviewed at length with the patient today.  Concerns regarding medicines are outlined above.    There are no Patient Instructions on file for this visit.       Signed, Levell July, NP 08/02/2019 9:09 AM    [facility], [place], [place] [address] Phone: [phone]; Fax: [phone]

## 2019-08-03 DIAGNOSIS — Z961 Presence of intraocular lens: Secondary | ICD-10-CM | POA: Diagnosis not present

## 2019-08-03 DIAGNOSIS — H40013 Open angle with borderline findings, low risk, bilateral: Secondary | ICD-10-CM | POA: Diagnosis not present

## 2019-08-13 DIAGNOSIS — E7849 Other hyperlipidemia: Secondary | ICD-10-CM | POA: Diagnosis not present

## 2019-08-13 DIAGNOSIS — I739 Peripheral vascular disease, unspecified: Secondary | ICD-10-CM | POA: Diagnosis not present

## 2019-08-13 DIAGNOSIS — R945 Abnormal results of liver function studies: Secondary | ICD-10-CM | POA: Diagnosis not present

## 2019-08-13 DIAGNOSIS — J309 Allergic rhinitis, unspecified: Secondary | ICD-10-CM | POA: Diagnosis not present

## 2019-08-13 DIAGNOSIS — I10 Essential (primary) hypertension: Secondary | ICD-10-CM | POA: Diagnosis not present

## 2019-08-13 DIAGNOSIS — I2510 Atherosclerotic heart disease of native coronary artery without angina pectoris: Secondary | ICD-10-CM | POA: Diagnosis not present

## 2019-08-13 DIAGNOSIS — E039 Hypothyroidism, unspecified: Secondary | ICD-10-CM | POA: Diagnosis not present

## 2019-08-13 DIAGNOSIS — R7301 Impaired fasting glucose: Secondary | ICD-10-CM | POA: Diagnosis not present

## 2019-09-03 DIAGNOSIS — I10 Essential (primary) hypertension: Secondary | ICD-10-CM | POA: Diagnosis not present

## 2019-09-03 DIAGNOSIS — E039 Hypothyroidism, unspecified: Secondary | ICD-10-CM | POA: Diagnosis not present

## 2019-09-03 DIAGNOSIS — R7301 Impaired fasting glucose: Secondary | ICD-10-CM | POA: Diagnosis not present

## 2019-09-03 DIAGNOSIS — E782 Mixed hyperlipidemia: Secondary | ICD-10-CM | POA: Diagnosis not present

## 2019-09-04 ENCOUNTER — Other Ambulatory Visit: Payer: Self-pay | Admitting: Allergy & Immunology

## 2019-09-06 DIAGNOSIS — I10 Essential (primary) hypertension: Secondary | ICD-10-CM | POA: Diagnosis not present

## 2019-09-06 DIAGNOSIS — R7301 Impaired fasting glucose: Secondary | ICD-10-CM | POA: Diagnosis not present

## 2019-09-06 DIAGNOSIS — R945 Abnormal results of liver function studies: Secondary | ICD-10-CM | POA: Diagnosis not present

## 2019-09-06 DIAGNOSIS — L309 Dermatitis, unspecified: Secondary | ICD-10-CM | POA: Diagnosis not present

## 2019-09-06 DIAGNOSIS — Z853 Personal history of malignant neoplasm of breast: Secondary | ICD-10-CM | POA: Diagnosis not present

## 2019-09-06 DIAGNOSIS — E039 Hypothyroidism, unspecified: Secondary | ICD-10-CM | POA: Diagnosis not present

## 2019-09-06 DIAGNOSIS — E782 Mixed hyperlipidemia: Secondary | ICD-10-CM | POA: Diagnosis not present

## 2019-09-06 DIAGNOSIS — I2510 Atherosclerotic heart disease of native coronary artery without angina pectoris: Secondary | ICD-10-CM | POA: Diagnosis not present

## 2019-09-06 DIAGNOSIS — J309 Allergic rhinitis, unspecified: Secondary | ICD-10-CM | POA: Diagnosis not present

## 2019-09-06 DIAGNOSIS — M170 Bilateral primary osteoarthritis of knee: Secondary | ICD-10-CM | POA: Diagnosis not present

## 2019-09-28 DIAGNOSIS — Z853 Personal history of malignant neoplasm of breast: Secondary | ICD-10-CM | POA: Diagnosis not present

## 2019-09-28 DIAGNOSIS — M170 Bilateral primary osteoarthritis of knee: Secondary | ICD-10-CM | POA: Diagnosis not present

## 2019-09-28 DIAGNOSIS — E7849 Other hyperlipidemia: Secondary | ICD-10-CM | POA: Diagnosis not present

## 2019-09-28 DIAGNOSIS — I2510 Atherosclerotic heart disease of native coronary artery without angina pectoris: Secondary | ICD-10-CM | POA: Diagnosis not present

## 2019-09-28 DIAGNOSIS — R945 Abnormal results of liver function studies: Secondary | ICD-10-CM | POA: Diagnosis not present

## 2019-09-28 DIAGNOSIS — I10 Essential (primary) hypertension: Secondary | ICD-10-CM | POA: Diagnosis not present

## 2019-09-28 DIAGNOSIS — R7301 Impaired fasting glucose: Secondary | ICD-10-CM | POA: Diagnosis not present

## 2019-09-28 DIAGNOSIS — E039 Hypothyroidism, unspecified: Secondary | ICD-10-CM | POA: Diagnosis not present

## 2019-09-28 DIAGNOSIS — J309 Allergic rhinitis, unspecified: Secondary | ICD-10-CM | POA: Diagnosis not present

## 2019-09-28 DIAGNOSIS — L309 Dermatitis, unspecified: Secondary | ICD-10-CM | POA: Diagnosis not present

## 2019-10-06 DIAGNOSIS — L299 Pruritus, unspecified: Secondary | ICD-10-CM | POA: Diagnosis not present

## 2019-10-06 DIAGNOSIS — W19XXXA Unspecified fall, initial encounter: Secondary | ICD-10-CM | POA: Diagnosis not present

## 2019-10-21 DIAGNOSIS — R7301 Impaired fasting glucose: Secondary | ICD-10-CM | POA: Diagnosis not present

## 2019-10-21 DIAGNOSIS — I10 Essential (primary) hypertension: Secondary | ICD-10-CM | POA: Diagnosis not present

## 2019-10-21 DIAGNOSIS — J309 Allergic rhinitis, unspecified: Secondary | ICD-10-CM | POA: Diagnosis not present

## 2019-10-21 DIAGNOSIS — R945 Abnormal results of liver function studies: Secondary | ICD-10-CM | POA: Diagnosis not present

## 2019-10-21 DIAGNOSIS — E039 Hypothyroidism, unspecified: Secondary | ICD-10-CM | POA: Diagnosis not present

## 2019-10-21 DIAGNOSIS — I2510 Atherosclerotic heart disease of native coronary artery without angina pectoris: Secondary | ICD-10-CM | POA: Diagnosis not present

## 2019-10-21 DIAGNOSIS — E7849 Other hyperlipidemia: Secondary | ICD-10-CM | POA: Diagnosis not present

## 2019-10-22 DIAGNOSIS — L4059 Other psoriatic arthropathy: Secondary | ICD-10-CM | POA: Diagnosis not present

## 2019-10-22 DIAGNOSIS — Z6829 Body mass index (BMI) 29.0-29.9, adult: Secondary | ICD-10-CM | POA: Diagnosis not present

## 2019-10-22 DIAGNOSIS — M5030 Other cervical disc degeneration, unspecified cervical region: Secondary | ICD-10-CM | POA: Diagnosis not present

## 2019-10-22 DIAGNOSIS — E663 Overweight: Secondary | ICD-10-CM | POA: Diagnosis not present

## 2019-10-22 DIAGNOSIS — M25551 Pain in right hip: Secondary | ICD-10-CM | POA: Diagnosis not present

## 2019-10-22 DIAGNOSIS — R945 Abnormal results of liver function studies: Secondary | ICD-10-CM | POA: Diagnosis not present

## 2019-10-22 DIAGNOSIS — R220 Localized swelling, mass and lump, head: Secondary | ICD-10-CM | POA: Diagnosis not present

## 2019-10-22 DIAGNOSIS — M25561 Pain in right knee: Secondary | ICD-10-CM | POA: Diagnosis not present

## 2019-10-22 DIAGNOSIS — M8580 Other specified disorders of bone density and structure, unspecified site: Secondary | ICD-10-CM | POA: Diagnosis not present

## 2019-10-22 DIAGNOSIS — M5441 Lumbago with sciatica, right side: Secondary | ICD-10-CM | POA: Diagnosis not present

## 2019-11-05 DIAGNOSIS — I739 Peripheral vascular disease, unspecified: Secondary | ICD-10-CM | POA: Diagnosis not present

## 2019-12-06 DIAGNOSIS — R69 Illness, unspecified: Secondary | ICD-10-CM | POA: Diagnosis not present

## 2019-12-22 DIAGNOSIS — S51811A Laceration without foreign body of right forearm, initial encounter: Secondary | ICD-10-CM | POA: Diagnosis not present

## 2019-12-22 DIAGNOSIS — M9905 Segmental and somatic dysfunction of pelvic region: Secondary | ICD-10-CM | POA: Diagnosis not present

## 2019-12-22 DIAGNOSIS — M9902 Segmental and somatic dysfunction of thoracic region: Secondary | ICD-10-CM | POA: Diagnosis not present

## 2020-01-14 DIAGNOSIS — D1801 Hemangioma of skin and subcutaneous tissue: Secondary | ICD-10-CM | POA: Diagnosis not present

## 2020-01-14 DIAGNOSIS — Z85828 Personal history of other malignant neoplasm of skin: Secondary | ICD-10-CM | POA: Diagnosis not present

## 2020-01-14 DIAGNOSIS — L570 Actinic keratosis: Secondary | ICD-10-CM | POA: Diagnosis not present

## 2020-01-14 DIAGNOSIS — L814 Other melanin hyperpigmentation: Secondary | ICD-10-CM | POA: Diagnosis not present

## 2020-01-21 DIAGNOSIS — I739 Peripheral vascular disease, unspecified: Secondary | ICD-10-CM | POA: Diagnosis not present

## 2020-02-13 ENCOUNTER — Ambulatory Visit: Payer: Medicare HMO | Admitting: Allergy & Immunology

## 2020-02-13 ENCOUNTER — Encounter: Payer: Self-pay | Admitting: Allergy & Immunology

## 2020-02-13 ENCOUNTER — Other Ambulatory Visit: Payer: Self-pay

## 2020-02-13 VITALS — BP 124/76 | HR 80 | Resp 17 | Ht 63.5 in | Wt 184.6 lb

## 2020-02-13 DIAGNOSIS — L299 Pruritus, unspecified: Secondary | ICD-10-CM

## 2020-02-13 DIAGNOSIS — J302 Other seasonal allergic rhinitis: Secondary | ICD-10-CM

## 2020-02-13 DIAGNOSIS — L2089 Other atopic dermatitis: Secondary | ICD-10-CM | POA: Diagnosis not present

## 2020-02-13 DIAGNOSIS — J3089 Other allergic rhinitis: Secondary | ICD-10-CM

## 2020-02-13 MED ORDER — FAMOTIDINE 20 MG PO TABS: 20.0000 mg | ORAL_TABLET | Freq: Two times a day (BID) | ORAL | 5 refills | Status: DC | PRN

## 2020-02-13 NOTE — Patient Instructions (Addendum)
1. Itching with sensitizations to grasses, indoor molds, dust mites - Continue with Allegra (fexofenadine) 180-360mg  (one to two tablets) at night.  - Strongly consider Dupixent injections for the best control of your itching.  - Information provided. - Tammy will be reaching out to you to discuss.  - The Dupixent can help with the itching and the eczema.  - Start Pepcid 20mg  1-2 times daily (this might help with the itching and this is available over the counter).   2. Eczema - Continue with moisturizing twice daily as as you are doing. - Continue with triamcinolone ointment twice daily as needed.   3. Return in about 6 months (around 08/14/2020). This can be an in-person, a virtual Webex or a telephone follow up visit.   Please inform us of any Emergency Department visits, hospitalizations, or changes in symptoms. Call us before going to the ED for breathing or allergy symptoms since we might be able to fit you in for a sick visit. Feel free to contact us anytime with any questions, problems, or concerns.  It was a pleasure to see you again today!  Websites that have reliable patient information: 1. American Academy of Asthma, Allergy, and Immunology: www.aaaai.org 2. Food Allergy Research and Education (FARE): foodallergy.org 3. Mothers of Asthmatics: http://www.asthmacommunitynetwork.org 4. American College of Allergy, Asthma, and Immunology: www.acaai.org   COVID-19 Vaccine Information can be found at: ShippingScam.co.uk For questions related to vaccine distribution or appointments, please email vaccine@[email or web address] .com or call [phone].     Like [place] on [place] and Instagram for our latest updates!        Make sure you are registered to vote! If you have moved or changed any of your contact information, you will need to get this updated before voting!  In some cases, you MAY be able to register to vote online:  [email or web address]

## 2020-02-13 NOTE — Progress Notes (Signed)
FOLLOW UP  Date of Service/Encounter:  02/13/20   Assessment:   Itching- with some clear environmental allergic triggers   Eczema - on triamcinolone ointment BID PRN (interested in [place] today)  Perennialand seasonalallergicrhinitis(grasses, indoor molds and dust mites)  Elevated absolute eosinophil count of 700 (April 2019)   Plan/Recommendations:   1. Itching with sensitizations to grasses, indoor molds, dust mites - Continue with Allegra (fexofenadine) 180-360mg  (one to two tablets) at night.  - Strongly consider Dupixent injections for the best control of your itching.  - Information provided. - Tammy will be reaching out to you to discuss.  - The Dupixent can help with the itching and the eczema.  - Start Pepcid 20mg  1-2 times daily (this might help with the itching and this is available over the counter).   2. Eczema - Continue with moisturizing twice daily as as you are doing. - Continue with triamcinolone ointment twice daily as needed.   3. Return in about 6 months (around 08/14/2020). This can be an in-person, a virtual Webex or a telephone follow up visit.  Subjective:   Maria Ritter is a 74 y.o. female presenting today for follow up of  Chief Complaint  Patient presents with  . Pruritus    Maria Ritter has a history of the following: Patient Active Problem List   Diagnosis Date Noted  . Abnormal stress test 08/02/2019  . Postinflammatory skin changes 06/14/2019  . Encounter for colorectal cancer screening 06/14/2019  . Encounter for gynecological examination with Papanicolaou smear of cervix 06/14/2019  . Lymph node enlargement   . Flexural atopic dermatitis 01/31/2018  . Itching 11/01/2017  . Seasonal and perennial allergic rhinitis 11/01/2017  . History of breast cancer in female 04/21/2016  . Routine cervical smear 04/21/2016  . Vaginal atrophy 04/21/2016  . OVERWEIGHT 01/28/2010  . LEG CRAMPS, NOCTURNAL 01/28/2010  .  ELECTROCARDIOGRAM, ABNORMAL 01/28/2010  . CARCINOMA IN SITU, RIGHT BREAST 11/27/2009  . HYPERLIPIDEMIA 11/27/2009  . ARM PAIN, LEFT 11/27/2009  . HYPOTHYROIDISM 11/26/2009  . Essential hypertension 11/26/2009    History obtained from: chart review and patient.  Maria Ritter is a 74 y.o. female presenting for a follow up visit.  She was last seen in June 2020.  She has a history of itching as well as eczema and perennial and seasonal allergic rhinitis.  She has had an elevated absolute eosinophil count of 700 in the past.  At the last visit, she was doing great with Allegra 1 to 2 tablets at night.  Eczema was under good control with triamcinolone.  We had discussed initiating Dupixent at 1 point, but she declined to do that.  Did not feel that allergen immunotherapy was indicated since her symptoms were so well controlled with antihistamines alone.  Since last visit, she has continued to have problems with itching. She does see a Paediatric nurse. She reports that she has issues with some recent insect bites. These are on her upper thighs. She tells me that she gets bit and this seems to never go away and continues to itch. She was diagnosed with lichen simplex. She was placed on triamcinolone 0.5% ointment 2-3 times daily. She has a diagnosis of eczema that is long standing. She has not had a biopsy performed. There is a lesion on her left thigh that has been plesent for one year.   She uses Cetaphil for moisturizing with vaseline.  Aside from the triamcinolone 0.5% ointment, she has no other topical ointments that she uses.  She does use two Allegra daily, but she does get tired from this.  Her itching has become more severe as of late.  She is interested in talking about other treatment modalities, but she is very hung up on the side effect profile of these new ideas.  Otherwise, there have been no changes to her past medical history, surgical history, family history, or social history.    Review of  Systems  Constitutional: Negative.  Negative for chills, fever, malaise/fatigue and weight loss.  HENT: Negative for congestion, ear discharge, ear pain and sinus pain.   Eyes: Negative for pain, discharge and redness.  Respiratory: Negative for cough, sputum production, shortness of breath and wheezing.   Cardiovascular: Negative.  Negative for chest pain and palpitations.  Gastrointestinal: Negative for abdominal pain, constipation, diarrhea, heartburn, nausea and vomiting.  Skin: Positive for itching and rash.  Neurological: Negative for dizziness and headaches.  Endo/Heme/Allergies: Positive for environmental allergies. Does not bruise/bleed easily.       Objective:   Blood pressure 124/76, pulse 80, resp. rate 17, height 5' 3.5" (1.613 m), weight 184 lb 9.6 oz (83.7 kg), SpO2 96 %. Body mass index is 32.19 kg/m.   Physical Exam:  Physical Exam  Constitutional: She appears well-developed.  Talkative.   HENT:  Head: Normocephalic and atraumatic.  Right Ear: Tympanic membrane, external ear and ear canal normal.  Left Ear: Tympanic membrane, external ear and ear canal normal.  Nose: No mucosal edema, rhinorrhea, nasal deformity or septal deviation. Right sinus exhibits no maxillary sinus tenderness and no frontal sinus tenderness. Left sinus exhibits no maxillary sinus tenderness and no frontal sinus tenderness.  Mouth/Throat: Uvula is midline. Mucous membranes are not pale and not dry.  Eyes: Pupils are equal, round, and reactive to light. Conjunctivae are normal. Right eye exhibits no chemosis and no discharge. Left eye exhibits no chemosis and no discharge. Right conjunctiva is not injected. Left conjunctiva is not injected.  Cardiovascular: Normal rate, regular rhythm and normal heart sounds.  Respiratory: Effort normal and breath sounds normal. No accessory muscle usage. No tachypnea. No respiratory distress. She has no wheezes. She has no rhonchi. She has no rales. She  exhibits no tenderness.  Lymphadenopathy:    She has no cervical adenopathy.  Neurological: She is alert.  Skin: No abrasion, no petechiae and no rash noted. Rash is not papular, not vesicular and not urticarial. No erythema. No pallor.  Multiple excoriations present. There are some lesions over the bilateral lesions. No draining or honey crusting noted.            Diagnostic studies: none       Salvatore Marvel, MD  [facility]

## 2020-02-14 ENCOUNTER — Telehealth: Payer: Self-pay | Admitting: *Deleted

## 2020-02-14 ENCOUNTER — Encounter: Payer: Self-pay | Admitting: Allergy & Immunology

## 2020-02-14 NOTE — Telephone Encounter (Signed)
Tried to contact patient no answer and unable to leave meesage. Will attempt again later

## 2020-02-14 NOTE — Telephone Encounter (Signed)
-----   Message from [name], MD sent at 02/14/2020  8:30 AM EDT ----- Can you reach out to discussed Maria Ritter for eczema/pruritis control?

## 2020-02-18 NOTE — Telephone Encounter (Signed)
Called and discussed with patient. At this time she advises that she doesn't know if she wants to do injections every two weeks.  I advised her in future if she decide she wants to try medication she can reach out to me

## 2020-02-20 DIAGNOSIS — J309 Allergic rhinitis, unspecified: Secondary | ICD-10-CM | POA: Diagnosis not present

## 2020-02-20 DIAGNOSIS — I10 Essential (primary) hypertension: Secondary | ICD-10-CM | POA: Diagnosis not present

## 2020-02-20 DIAGNOSIS — I2510 Atherosclerotic heart disease of native coronary artery without angina pectoris: Secondary | ICD-10-CM | POA: Diagnosis not present

## 2020-02-20 DIAGNOSIS — E782 Mixed hyperlipidemia: Secondary | ICD-10-CM | POA: Diagnosis not present

## 2020-02-20 DIAGNOSIS — L03119 Cellulitis of unspecified part of limb: Secondary | ICD-10-CM | POA: Diagnosis not present

## 2020-02-20 DIAGNOSIS — R7301 Impaired fasting glucose: Secondary | ICD-10-CM | POA: Diagnosis not present

## 2020-02-20 DIAGNOSIS — J060 Acute laryngopharyngitis: Secondary | ICD-10-CM | POA: Diagnosis not present

## 2020-02-20 DIAGNOSIS — E039 Hypothyroidism, unspecified: Secondary | ICD-10-CM | POA: Diagnosis not present

## 2020-02-20 DIAGNOSIS — K219 Gastro-esophageal reflux disease without esophagitis: Secondary | ICD-10-CM | POA: Diagnosis not present

## 2020-02-20 DIAGNOSIS — E7849 Other hyperlipidemia: Secondary | ICD-10-CM | POA: Diagnosis not present

## 2020-02-20 DIAGNOSIS — L03818 Cellulitis of other sites: Secondary | ICD-10-CM | POA: Diagnosis not present

## 2020-02-26 DIAGNOSIS — Z853 Personal history of malignant neoplasm of breast: Secondary | ICD-10-CM | POA: Diagnosis not present

## 2020-02-26 DIAGNOSIS — J309 Allergic rhinitis, unspecified: Secondary | ICD-10-CM | POA: Diagnosis not present

## 2020-02-26 DIAGNOSIS — I2510 Atherosclerotic heart disease of native coronary artery without angina pectoris: Secondary | ICD-10-CM | POA: Diagnosis not present

## 2020-02-26 DIAGNOSIS — M170 Bilateral primary osteoarthritis of knee: Secondary | ICD-10-CM | POA: Diagnosis not present

## 2020-02-26 DIAGNOSIS — R945 Abnormal results of liver function studies: Secondary | ICD-10-CM | POA: Diagnosis not present

## 2020-02-26 DIAGNOSIS — E039 Hypothyroidism, unspecified: Secondary | ICD-10-CM | POA: Diagnosis not present

## 2020-02-26 DIAGNOSIS — R7301 Impaired fasting glucose: Secondary | ICD-10-CM | POA: Diagnosis not present

## 2020-02-26 DIAGNOSIS — E782 Mixed hyperlipidemia: Secondary | ICD-10-CM | POA: Diagnosis not present

## 2020-02-26 DIAGNOSIS — L309 Dermatitis, unspecified: Secondary | ICD-10-CM | POA: Diagnosis not present

## 2020-02-26 DIAGNOSIS — I10 Essential (primary) hypertension: Secondary | ICD-10-CM | POA: Diagnosis not present

## 2020-03-18 DIAGNOSIS — Z1211 Encounter for screening for malignant neoplasm of colon: Secondary | ICD-10-CM | POA: Diagnosis not present

## 2020-04-02 ENCOUNTER — Other Ambulatory Visit: Payer: Self-pay

## 2020-04-02 ENCOUNTER — Ambulatory Visit (HOSPITAL_COMMUNITY)
Admission: RE | Admit: 2020-04-02 | Discharge: 2020-04-02 | Disposition: A | Discharge status: Home / Self Care | Payer: Medicare HMO | Source: Ambulatory Visit | Attending: Hematology | Admitting: Hematology

## 2020-04-02 DIAGNOSIS — Z853 Personal history of malignant neoplasm of breast: Secondary | ICD-10-CM | POA: Insufficient documentation

## 2020-04-02 DIAGNOSIS — Z1231 Encounter for screening mammogram for malignant neoplasm of breast: Secondary | ICD-10-CM | POA: Diagnosis not present

## 2020-04-02 DIAGNOSIS — D0511 Intraductal carcinoma in situ of right breast: Secondary | ICD-10-CM

## 2020-04-04 ENCOUNTER — Ambulatory Visit (HOSPITAL_COMMUNITY): Payer: Medicare HMO | Admitting: Nurse Practitioner

## 2020-04-08 DIAGNOSIS — I10 Essential (primary) hypertension: Secondary | ICD-10-CM | POA: Diagnosis not present

## 2020-04-08 DIAGNOSIS — E7849 Other hyperlipidemia: Secondary | ICD-10-CM | POA: Diagnosis not present

## 2020-04-08 DIAGNOSIS — E039 Hypothyroidism, unspecified: Secondary | ICD-10-CM | POA: Diagnosis not present

## 2020-04-08 DIAGNOSIS — Z0001 Encounter for general adult medical examination with abnormal findings: Secondary | ICD-10-CM | POA: Diagnosis not present

## 2020-04-08 DIAGNOSIS — Z23 Encounter for immunization: Secondary | ICD-10-CM | POA: Diagnosis not present

## 2020-04-08 DIAGNOSIS — R945 Abnormal results of liver function studies: Secondary | ICD-10-CM | POA: Diagnosis not present

## 2020-04-08 DIAGNOSIS — Z6829 Body mass index (BMI) 29.0-29.9, adult: Secondary | ICD-10-CM | POA: Diagnosis not present

## 2020-04-08 DIAGNOSIS — M25562 Pain in left knee: Secondary | ICD-10-CM | POA: Diagnosis not present

## 2020-04-08 DIAGNOSIS — L309 Dermatitis, unspecified: Secondary | ICD-10-CM | POA: Diagnosis not present

## 2020-04-08 DIAGNOSIS — Z853 Personal history of malignant neoplasm of breast: Secondary | ICD-10-CM | POA: Diagnosis not present

## 2020-04-08 DIAGNOSIS — M1711 Unilateral primary osteoarthritis, right knee: Secondary | ICD-10-CM | POA: Diagnosis not present

## 2020-04-08 DIAGNOSIS — L603 Nail dystrophy: Secondary | ICD-10-CM | POA: Diagnosis not present

## 2020-04-08 DIAGNOSIS — R05 Cough: Secondary | ICD-10-CM | POA: Diagnosis not present

## 2020-04-08 DIAGNOSIS — J309 Allergic rhinitis, unspecified: Secondary | ICD-10-CM | POA: Diagnosis not present

## 2020-04-08 DIAGNOSIS — E782 Mixed hyperlipidemia: Secondary | ICD-10-CM | POA: Diagnosis not present

## 2020-04-08 DIAGNOSIS — R7301 Impaired fasting glucose: Secondary | ICD-10-CM | POA: Diagnosis not present

## 2020-04-08 DIAGNOSIS — M170 Bilateral primary osteoarthritis of knee: Secondary | ICD-10-CM | POA: Diagnosis not present

## 2020-04-08 DIAGNOSIS — I2510 Atherosclerotic heart disease of native coronary artery without angina pectoris: Secondary | ICD-10-CM | POA: Diagnosis not present

## 2020-04-08 DIAGNOSIS — D519 Vitamin B12 deficiency anemia, unspecified: Secondary | ICD-10-CM | POA: Diagnosis not present

## 2020-04-08 DIAGNOSIS — M62838 Other muscle spasm: Secondary | ICD-10-CM | POA: Diagnosis not present

## 2020-04-09 ENCOUNTER — Ambulatory Visit (HOSPITAL_COMMUNITY): Payer: Medicare HMO | Admitting: Nurse Practitioner

## 2020-04-10 ENCOUNTER — Telehealth: Payer: Self-pay | Admitting: *Deleted

## 2020-04-10 ENCOUNTER — Telehealth: Payer: Self-pay | Admitting: Adult Health

## 2020-04-10 NOTE — Telephone Encounter (Signed)
Would like someone to review mammogram results with her.

## 2020-04-10 NOTE — Telephone Encounter (Signed)
Patient informed mammogram was normal and can repeat in 1 year.  Pt verbalized understanding.

## 2020-04-14 DIAGNOSIS — M5030 Other cervical disc degeneration, unspecified cervical region: Secondary | ICD-10-CM | POA: Diagnosis not present

## 2020-04-14 DIAGNOSIS — M25561 Pain in right knee: Secondary | ICD-10-CM | POA: Diagnosis not present

## 2020-04-14 DIAGNOSIS — R220 Localized swelling, mass and lump, head: Secondary | ICD-10-CM | POA: Diagnosis not present

## 2020-04-14 DIAGNOSIS — M8580 Other specified disorders of bone density and structure, unspecified site: Secondary | ICD-10-CM | POA: Diagnosis not present

## 2020-04-14 DIAGNOSIS — M25551 Pain in right hip: Secondary | ICD-10-CM | POA: Diagnosis not present

## 2020-04-14 DIAGNOSIS — Z6830 Body mass index (BMI) 30.0-30.9, adult: Secondary | ICD-10-CM | POA: Diagnosis not present

## 2020-04-14 DIAGNOSIS — M5441 Lumbago with sciatica, right side: Secondary | ICD-10-CM | POA: Diagnosis not present

## 2020-04-14 DIAGNOSIS — R945 Abnormal results of liver function studies: Secondary | ICD-10-CM | POA: Diagnosis not present

## 2020-04-14 DIAGNOSIS — E669 Obesity, unspecified: Secondary | ICD-10-CM | POA: Diagnosis not present

## 2020-04-14 DIAGNOSIS — L4059 Other psoriatic arthropathy: Secondary | ICD-10-CM | POA: Diagnosis not present

## 2020-04-21 DIAGNOSIS — R69 Illness, unspecified: Secondary | ICD-10-CM | POA: Diagnosis not present

## 2020-04-22 DIAGNOSIS — I739 Peripheral vascular disease, unspecified: Secondary | ICD-10-CM | POA: Diagnosis not present

## 2020-04-24 DIAGNOSIS — L309 Dermatitis, unspecified: Secondary | ICD-10-CM | POA: Diagnosis not present

## 2020-04-24 DIAGNOSIS — E782 Mixed hyperlipidemia: Secondary | ICD-10-CM | POA: Diagnosis not present

## 2020-04-24 DIAGNOSIS — M170 Bilateral primary osteoarthritis of knee: Secondary | ICD-10-CM | POA: Diagnosis not present

## 2020-04-24 DIAGNOSIS — I10 Essential (primary) hypertension: Secondary | ICD-10-CM | POA: Diagnosis not present

## 2020-04-24 DIAGNOSIS — Z853 Personal history of malignant neoplasm of breast: Secondary | ICD-10-CM | POA: Diagnosis not present

## 2020-04-24 DIAGNOSIS — I2510 Atherosclerotic heart disease of native coronary artery without angina pectoris: Secondary | ICD-10-CM | POA: Diagnosis not present

## 2020-04-24 DIAGNOSIS — R945 Abnormal results of liver function studies: Secondary | ICD-10-CM | POA: Diagnosis not present

## 2020-04-24 DIAGNOSIS — E039 Hypothyroidism, unspecified: Secondary | ICD-10-CM | POA: Diagnosis not present

## 2020-04-24 DIAGNOSIS — J309 Allergic rhinitis, unspecified: Secondary | ICD-10-CM | POA: Diagnosis not present

## 2020-04-24 DIAGNOSIS — R7301 Impaired fasting glucose: Secondary | ICD-10-CM | POA: Diagnosis not present

## 2020-04-25 ENCOUNTER — Ambulatory Visit (HOSPITAL_COMMUNITY): Payer: Medicare HMO | Admitting: Nurse Practitioner

## 2020-04-28 ENCOUNTER — Other Ambulatory Visit: Payer: Self-pay

## 2020-04-28 ENCOUNTER — Ambulatory Visit (HOSPITAL_COMMUNITY): Payer: Medicare HMO | Attending: Internal Medicine | Admitting: Physical Therapy

## 2020-04-28 ENCOUNTER — Encounter (HOSPITAL_COMMUNITY): Payer: Self-pay | Admitting: Physical Therapy

## 2020-04-28 DIAGNOSIS — R262 Difficulty in walking, not elsewhere classified: Secondary | ICD-10-CM | POA: Insufficient documentation

## 2020-04-28 DIAGNOSIS — M5416 Radiculopathy, lumbar region: Secondary | ICD-10-CM

## 2020-04-28 NOTE — Patient Instructions (Signed)
PELVIC STABILIZATION: Basic Bridge    Exhaling, lift hips. Hold for __3_ breaths. Exhaling, release hips back to floor. Repeat _10-15__ times. Do _2__ times per day.  Copyright  VHI. All rights reserved.

## 2020-04-28 NOTE — Therapy (Signed)
[facility] [place], [place], [address] Phone: [phone]   Fax:  [phone]  Physical Therapy Evaluation  Patient Details  Name: Maria Ritter MRN: [id number] Date of Birth: [birth year] Referring Provider (PT): Maria Ritter   Encounter Date: 04/28/2020   PT End of Session - 04/28/20 1254    Visit Number 1    Number of Visits 4    Date for PT Re-Evaluation 05/28/20    Authorization Type [employer] medicare    Authorization - Visit Number 1    Authorization - Number of Visits 4    Progress Note Due on Visit 4    PT Start Time [phone]    PT Stop Time 1010    PT Time Calculation (min) 42 min    Behavior During Therapy [facility] for tasks assessed/performed           Past Medical History:  Diagnosis Date  . Breast cancer ([place]) 2010   Right Lumpectomy and XRT  . Carotid artery disease (H[facility])    Mild to moderate bilateral ICA disease, left greater than right 07/2015  . Essential hypertension   . GERD (gastroesophageal reflux disease)   . Glaucoma   . Hyperlipidemia   . Hypothyroidism   . Osteoarthritis   . Polymyalgia rheumatica (H[facility])     Past Surgical History:  Procedure Laterality Date  . AXILLARY LYMPH NODE BIOPSY Right 05/21/2019   Procedure: RIGHT AXILLARY LYMPH NODE BIOPSY;  Surgeon: Maria Signs, MD;  Location: [facility];  Service: General;  Laterality: Right;  . BASAL CELL CARCINOMA EXCISION  2010   Right breast  . BREAST LUMPECTOMY Right 2010  . ENDOMETRIAL ABLATION  2002  . FINGER SURGERY     Minor surgical intervention following a crush injury to the left fourth finger  . TOOTH EXTRACTION  01/2019    There were no vitals filed for this visit.    Subjective Assessment - 04/28/20 0943    Subjective Pt states that she fell back in December and has had pain ever since.  The pain occurs when she is up walking it is in her right side of her back and radiates anteriorly to her knee level.  The pt takes cardiac  therapy twice a week and requests to try and work on this at home.    Pertinent History HTN,    Limitations Standing;Walking;House hold activities    How long can you sit comfortably? no proble    How long can you stand comfortably? 5 minutes    How long can you walk comfortably? 10 minutes started using a cane due to her pain.    Patient Stated Goals less pain, walk and stand longer, walk without her cane    Currently in Pain? Yes    Pain Score 2    worst pain in the past week 6; best 0   Pain Location Hip    Pain Orientation Right    Pain Descriptors / Indicators Aching    Pain Type Chronic pain    Pain Radiating Towards knee    Pain Onset More than a month ago    Pain Frequency Intermittent    Aggravating Factors  standing and walking    Pain Relieving Factors sitting    Effect of Pain on Daily Activities limits              [facility] PT Assessment - 04/28/20 0001      Assessment   Medical Diagnosis Radicular  back pain     Referring Provider (PT) Maria Ritter    Onset Date/Surgical Date 08/14/19    Prior Therapy cardiac       Precautions   Precautions None      Restrictions   Weight Bearing Restrictions No      Balance Screen   Has the patient fallen in the past 6 months No    Has the patient had a decrease in activity level because of a fear of falling?  Yes    Is the patient reluctant to leave their home because of a fear of falling?  No      Prior Function   Level of Independence Independent      Cognition   Overall Cognitive Status Within Functional Limits for tasks assessed      Observation/Other Assessments   Focus on Therapeutic Outcomes (FOTO)  51 ; 49% affected      Functional Tests   Functional tests Sit to Stand      Sit to Stand   Comments 7 x in 30"       Posture/Postural Control   Posture/Postural Control Postural limitations    Postural Limitations Rounded Shoulders;Forward head;Increased thoracic kyphosis      ROM / Strength   AROM /  PROM / Strength AROM;Strength      AROM   AROM Assessment Site Hip;Lumbar    Lumbar Flexion fingers 5" from floor   no change of sx   Lumbar Extension 30   improved with reps    Lumbar - Right Side Bend 15    Lumbar - Left Side Bend 10      Strength   Overall Strength Comments --    Strength Assessment Site Hip;Knee    Right/Left Hip Right;Left    Right Hip Flexion 4+/5    Right Hip Extension 3/5    Right Hip ABduction 5/5    Left Hip Flexion 5/5    Left Hip Extension 3+/5    Left Hip ABduction 4/5    Right/Left Knee Right;Left    Right Knee Flexion 4/5    Right Knee Extension 5/5    Left Knee Flexion 4/5    Left Knee Extension 5/5                      Objective measurements completed on examination: See above findings.       [facility] PT Treatment/Exercise - 04/28/20 0001      Exercises   Exercises Lumbar   PT given decompression exercises but did not go over.      Lumbar Exercises: Stretches   Other Lumbar Stretch Exercise lumbar excursions.       Lumbar Exercises: Supine   Bridge 10 reps                  PT Education - 04/28/20 1253    Education Details HEP    Person(s) Educated Patient    Methods Explanation;Demonstration    Comprehension Verbalized understanding;Returned demonstration            PT Short Term Goals - 04/28/20 1300      PT SHORT TERM GOAL #1   Title PT to be I in HEP to allow radicular pain to not go past mid thigh level.    Time 2    Period Weeks    Status New    Target Date 05/12/20      PT SHORT TERM GOAL #2  Title Pt pain to be no greater than a 4/10    Time 2    Period Weeks    Status New             PT Long Term Goals - 04/28/20 1301      PT LONG TERM GOAL #1   Title PT to be I in an advance HEP to decrease pain to no greater than a 2/10    Time 4    Period Weeks    Status New    Target Date 05/26/20      PT LONG TERM GOAL #2   Title Pt pain to be in her hip area only    Time 4     Period Weeks    Status New      PT LONG TERM GOAL #3   Title Pt to be able to single leg stance for 30 seconds bilaterally to allow pt to feel confident in waliking without her cane .    Time 4    Period Weeks    Status New                  Plan - 04/28/20 1255    Clinical Impression Statement [name] is a 74 yo female with complaints for Rt radiculopathy anteriorly.  She has been referred to skilled PT but wants to limit her PT visits due to going to cardiac rehab at this time.  Evaluation demonstrates decreased ROM, decreased strength, decreased activity tolerance, postureal dysfunction and increased pain.  Ms. Placide will benefit from skilled PT to address these issues and maximize her funcitonal ability.    Personal Factors and Comorbidities Fitness;Age    Examination-Activity Limitations Carry;Lift;Locomotion Level;Squat;Stand    Examination-Participation Restrictions Cleaning;Laundry;Yard Work;Shop    Stability/Clinical Decision Making Evolving/Moderate complexity    Clinical Decision Making Moderate    Rehab Potential Good    PT Frequency 1x / week    PT Duration 4 weeks    PT Treatment/Interventions Therapeutic activities;Functional mobility training;Therapeutic exercise;Patient/family education;Manual techniques    PT Next Visit Plan review decompression exercises, begin knee to chest, hamstring stretches, poe and manual to improve lumbar movemetn.           Patient will benefit from skilled therapeutic intervention in order to improve the following deficits and impairments:  Decreased activity tolerance, Cardiopulmonary status limiting activity, Decreased range of motion, Decreased strength, Difficulty walking, Pain, Postural dysfunction  Visit Diagnosis: Right lumbar radiculopathy  Difficulty in walking, not elsewhere classified     Problem List Patient Active Problem List   Diagnosis Date Noted  . Abnormal stress test 08/02/2019  . Postinflammatory skin  changes 06/14/2019  . Encounter for colorectal cancer screening 06/14/2019  . Encounter for gynecological examination with Papanicolaou smear of cervix 06/14/2019  . Lymph node enlargement   . Flexural atopic dermatitis 01/31/2018  . Itching 11/01/2017  . Seasonal and perennial allergic rhinitis 11/01/2017  . History of breast cancer in female 04/21/2016  . Routine cervical smear 04/21/2016  . Vaginal atrophy 04/21/2016  . OVERWEIGHT 01/28/2010  . LEG CRAMPS, NOCTURNAL 01/28/2010  . ELECTROCARDIOGRAM, ABNORMAL 01/28/2010  . CARCINOMA IN SITU, RIGHT BREAST 11/27/2009  . HYPERLIPIDEMIA 11/27/2009  . ARM PAIN, LEFT 11/27/2009  . HYPOTHYROIDISM 11/26/2009  . Essential hypertension 11/26/2009    Rayetta Humphrey, PT CLT [phone] 04/28/2020, 1:04 PM  [facility] [address] [place], [place], [address] Phone: [phone]   Fax:  [phone]  Name: Maria Ritter MRN: [id number] Date of Birth: [birth year]

## 2020-05-02 ENCOUNTER — Other Ambulatory Visit: Payer: Self-pay

## 2020-05-02 ENCOUNTER — Other Ambulatory Visit (HOSPITAL_COMMUNITY): Payer: Self-pay | Admitting: Nurse Practitioner

## 2020-05-02 ENCOUNTER — Inpatient Hospital Stay (HOSPITAL_COMMUNITY): Payer: Medicare HMO | Attending: Nurse Practitioner | Admitting: Nurse Practitioner

## 2020-05-02 VITALS — BP 131/54 | HR 70 | Temp 98.8°F | Resp 16 | Wt 186.3 lb

## 2020-05-02 DIAGNOSIS — Z79899 Other long term (current) drug therapy: Secondary | ICD-10-CM | POA: Insufficient documentation

## 2020-05-02 DIAGNOSIS — R748 Abnormal levels of other serum enzymes: Secondary | ICD-10-CM | POA: Insufficient documentation

## 2020-05-02 DIAGNOSIS — D0511 Intraductal carcinoma in situ of right breast: Secondary | ICD-10-CM

## 2020-05-02 DIAGNOSIS — Z170 Estrogen receptor positive status [ER+]: Secondary | ICD-10-CM | POA: Insufficient documentation

## 2020-05-02 DIAGNOSIS — Z1231 Encounter for screening mammogram for malignant neoplasm of breast: Secondary | ICD-10-CM

## 2020-05-02 DIAGNOSIS — M810 Age-related osteoporosis without current pathological fracture: Secondary | ICD-10-CM | POA: Diagnosis not present

## 2020-05-02 NOTE — Progress Notes (Signed)
[employer] [address] [place] [address]   CLINIC:  Medical Oncology/Hematology  PCP:  Celene Squibb, MD [place] [address] [phone]   REASON FOR VISIT: Follow-up for breast cancer   CURRENT THERAPY: Surveillance   INTERVAL HISTORY:  Maria Ritter 74 y.o. female returns for routine follow-up for breast cancer.  Patient reports she is doing well since her last visit.  She reports her PCP is following her with labs.  She denies any new lumps or bumps present.  She denies any new bone pain. Denies any nausea, vomiting, or diarrhea. Denies any new pains. Had not noticed any recent bleeding such as epistaxis, hematuria or hematochezia. Denies recent chest pain on exertion, shortness of breath on minimal exertion, pre-syncopal episodes, or palpitations. Denies any numbness or tingling in hands or feet. Denies any recent fevers, infections, or recent hospitalizations. Patient reports appetite at 100% and energy level at 50%.     REVIEW OF SYSTEMS:  Review of Systems  Neurological: Positive for dizziness.  All other systems reviewed and are negative.    PAST MEDICAL/SURGICAL HISTORY:  Past Medical History:  Diagnosis Date  . Breast cancer ([facility]) 2010   Right Lumpectomy and XRT  . Carotid artery disease ([facility])    Mild to moderate bilateral ICA disease, left greater than right 07/2015  . Essential hypertension   . GERD (gastroesophageal reflux disease)   . Glaucoma   . Hyperlipidemia   . Hypothyroidism   . Osteoarthritis   . Polymyalgia rheumatica ([facility])    Past Surgical History:  Procedure Laterality Date  . AXILLARY LYMPH NODE BIOPSY Right 05/21/2019   Procedure: RIGHT AXILLARY LYMPH NODE BIOPSY;  Surgeon: Aviva Signs, MD;  Location: [facility];  Service: General;  Laterality: Right;  . BASAL CELL CARCINOMA EXCISION  2010   Right breast  . BREAST LUMPECTOMY Right 2010  . ENDOMETRIAL ABLATION  2002  . FINGER SURGERY     Minor  surgical intervention following a crush injury to the left fourth finger  . TOOTH EXTRACTION  01/2019     SOCIAL HISTORY:  Social History   Socioeconomic History  . Marital status: Widowed    Spouse name: Not on file  . Number of children: Not on file  . Years of education: Not on file  . Highest education level: Not on file  Occupational History  . Occupation: Retired from [employer]: RETIRED  Tobacco Use  . Smoking status: Never Smoker  . Smokeless tobacco: Never Used  Vaping Use  . Vaping Use: Never used  Substance and Sexual Activity  . Alcohol use: No    Alcohol/week: 0.0 standard drinks  . Drug use: No  . Sexual activity: Not on file    Comment: ablation  Other Topics Concern  . Not on file  Social History Narrative   Widowed with no children   Social Determinants of Health   Financial Resource Strain:   . Difficulty of Paying Living Expenses: Not on file  Food Insecurity:   . Worried About Charity fundraiser in the Last Year: Not on file  . Ran Out of Food in the Last Year: Not on file  Transportation Needs:   . Lack of Transportation (Medical): Not on file  . Lack of Transportation (Non-Medical): Not on file  Physical Activity:   . Days of Exercise per Week: Not on file  . Minutes of Exercise per Session:  Not on file  Stress:   . Feeling of Stress : Not on file  Social Connections:   . Frequency of Communication with Friends and Family: Not on file  . Frequency of Social Gatherings with Friends and Family: Not on file  . Attends Religious Services: Not on file  . Active Member of Clubs or Organizations: Not on file  . Attends Archivist Meetings: Not on file  . Marital Status: Not on file  Intimate Partner Violence:   . Fear of Current or Ex-Partner: Not on file  . Emotionally Abused: Not on file  . Physically Abused: Not on file  . Sexually Abused: Not on file    FAMILY HISTORY:  Family History  Problem Relation Age of  Onset  . Pneumonia Mother   . Parkinsonism Mother   . Heart failure Father     CURRENT MEDICATIONS:  Outpatient Encounter Medications as of 05/02/2020  Medication Sig  . amLODipine (NORVASC) 5 MG tablet Take 5 mg by mouth daily.    Marland Kitchen aspirin 81 MG tablet Take 81 mg by mouth daily.    Marland Kitchen CALCIUM CITRATE-VITAMIN D PO Take 1 tablet by mouth daily.   . Cholecalciferol (VITAMIN D3 PO) Take 2,000 Units by mouth 2 (two) times daily.  Noelle Penner ALLERGY RELIEF 180 MG tablet TAKE 1 TABLET BY MOUTH ONCE DAILY AS NEEDED FOR  ALLERGIES  OR  RHINITIS  . famotidine (PEPCID) 20 MG tablet Take 1 tablet (20 mg total) by mouth 2 (two) times daily as needed for heartburn or indigestion.  Marland Kitchen GLUCOSAMINE-CHONDROITIN PO Take 1 tablet by mouth 2 (two) times daily.   . hydrOXYzine (VISTARIL) 25 MG capsule Take 25 mg by mouth every 6 (six) hours as needed.  Marland Kitchen ibuprofen (ADVIL,MOTRIN) 200 MG tablet Take 200 mg by mouth every 6 (six) hours as needed for moderate pain.   Marland Kitchen levothyroxine (SYNTHROID) 150 MCG tablet Take 150 mcg by mouth daily.  Marland Kitchen lisinopril (PRINIVIL,ZESTRIL) 10 MG tablet Take 10 mg by mouth daily.  . Magnesium 250 MG TABS Take 250 mg by mouth daily.   . meloxicam (MOBIC) 7.5 MG tablet Take 7.5 mg by mouth daily as needed.  . methocarbamol (ROBAXIN) 500 MG tablet Take 500 mg by mouth 3 (three) times daily as needed for muscle spasms.   . metroNIDAZOLE (METROGEL) 0.75 % gel Apply 1 application topically 2 (two) times daily as needed (rosacea).   . Omega-3 1000 MG CAPS Take 1,000 mg by mouth 2 (two) times daily.  Vladimir Faster Glycol-Propyl Glycol (SYSTANE OP) Apply 1 drop to eye 2 (two) times daily as needed (dry eyes).   . rosuvastatin (CRESTOR) 10 MG tablet Take 10 mg by mouth daily.  . silver sulfADIAZINE (SILVADENE) 1 % cream Apply 1 application topically daily.  . sodium chloride (OCEAN) 0.65 % SOLN nasal spray Place 1 spray into both nostrils as needed for congestion.  . triamcinolone ointment (KENALOG) 0.5  % Apply 1 application topically 2 (two) times daily.  . Turmeric 500 MG CAPS Take 500 mg by mouth daily.  . vitamin B-12 (CYANOCOBALAMIN) 1000 MCG tablet Take 500 mcg by mouth daily.  . [DISCONTINUED] BIOTIN PO Take 5,000 mcg by mouth daily.  . [DISCONTINUED] levothyroxine (SYNTHROID, LEVOTHROID) 137 MCG tablet Take 137 mcg by mouth daily before breakfast.    No facility-administered encounter medications on file as of 05/02/2020.    ALLERGIES:  Allergies  Allergen Reactions  . Mango Flavor Hives  . Erythromycin Nausea Only  .  Ivp Dye [Iodinated Diagnostic Agents]   . Penicillins Itching    Did it involve swelling of the face/tongue/throat, SOB, or low BP? No Did it involve sudden or severe rash/hives, skin peeling, or any reaction on the inside of your mouth or nose? No Did you need to seek medical attention at a hospital or doctor's office? No When did it last happen?10 years ago If all above answers are "NO", may proceed with cephalosporin use.   . Sulfonamide Derivatives Itching  . Neosporin [Bacitracin-Polymyxin B] Itching  . Tape Hives and Rash    Tape or gel     PHYSICAL EXAM:  ECOG Performance status: 1  Vitals:   05/02/20 0947  BP: (!) 131/54  Pulse: 70  Resp: 16  Temp: 98.8 F (37.1 C)  SpO2: 100%   Filed Weights   05/02/20 0947  Weight: 186 lb 4.6 oz (84.5 kg)   Physical Exam Constitutional:      Appearance: Normal appearance. She is normal weight.  Cardiovascular:     Rate and Rhythm: Normal rate and regular rhythm.     Heart sounds: Normal heart sounds.  Pulmonary:     Effort: Pulmonary effort is normal.     Breath sounds: Normal breath sounds.  Abdominal:     General: Bowel sounds are normal.     Palpations: Abdomen is soft.  Musculoskeletal:        General: Normal range of motion.  Skin:    General: Skin is warm.  Neurological:     Mental Status: She is alert and oriented to person, place, and time. Mental status is at baseline.    Psychiatric:        Mood and Affect: Mood normal.        Behavior: Behavior normal.        Thought Content: Thought content normal.        Judgment: Judgment normal.      LABORATORY DATA:  I have reviewed the labs as listed.  CBC    Component Value Date/Time   WBC 12.0 (H) 05/26/2019 1802   RBC 4.54 05/26/2019 1802   HGB 13.4 05/26/2019 1802   HCT 43.2 05/26/2019 1802   PLT 288 05/26/2019 1802   MCV 95.2 05/26/2019 1802   MCH 29.5 05/26/2019 1802   MCHC 31.0 05/26/2019 1802   RDW 13.6 05/26/2019 1802   LYMPHSABS 2.9 05/26/2019 1802   MONOABS 1.1 (H) 05/26/2019 1802   EOSABS 0.3 05/26/2019 1802   BASOSABS 0.1 05/26/2019 1802   CMP Latest Ref Rng & Units 05/26/2019 05/17/2019 01/08/2010  Glucose 70 - 99 mg/dL 113(H) 85 97  BUN 8 - 23 mg/dL 24(H) 21 19  Creatinine 0.44 - 1.00 mg/dL 0.66 0.76 0.93  Sodium 135 - 145 mmol/L 138 137 140  Potassium 3.5 - 5.1 mmol/L 4.1 4.0 4.7  Chloride 98 - 111 mmol/L 101 100 101  CO2 22 - 32 mmol/L 26 28 29   Calcium 8.9 - 10.3 mg/dL 9.0 9.0 9.4  Total Protein 6.0 - 8.3 g/dL - - 6.8  Total Bilirubin 0.3 - 1.2 mg/dL - - 0.4  Alkaline Phos 39 - 117 units/L - - 75  AST 0 - 37 units/L - - 32  ALT 0 - 35 units/L - - 45(H)    DIAGNOSTIC IMAGING:  I have independently reviewed the mammogram scans and discussed with the patient.  ASSESSMENT & PLAN:  CARCINOMA IN SITU, RIGHT BREAST 1.  Right breast DCIS: -Diagnosed on 09/25/2008 after right breast  biopsy, right breast lumpectomy and lymph node biopsy on 10/24/2008. -Pathology showed high-grade DCIS, negative margins, 1.9 cm, 0/4 lymph nodes positive, ER 99% positive, PR 59% positive, free margins, PT ISP N0. - She took Evista since then. - Recent ultrasound on 04/10/2019 showed right axillary lymph node, ultrasound-guided core biopsy shows reactive lymph node. - Right axillary lymph node excision biopsy on 05/21/2019 shows reactive lymphoid hyperplasia with sinus histiocytes. - I have told her to  discontinue Evista. -She reports her PCP checks her yearly labs yearly. -Mammogram done on 04/02/2020 showed BI-RADS Category 1 negative. -She wishes to follow-up with her PCP yearly from here out.  2.  Osteopenia: -DEXA scan on 07/17/2018 shows T score of -1.4.  This is stable compared to DEXA scan in 2017. - She was counseled to take calcium and vitamin D supplements. -PCP will handle her next DEXA scan.  3.  Elevated liver enzymes: -She has elevated AST and ALT, mild at 61 and 36 respectively.  She had elevated LFTs last year also. - CT of the abdomen from 2011 showed mild diffuse fatty infiltration of the liver which could be contributing to enzyme elevation.     Orders placed this encounter:  Orders Placed This Encounter  Procedures  . MM 3D SCREEN BREAST BILATERAL      Francene Finders, FNP-C [place] [phone]

## 2020-05-02 NOTE — Assessment & Plan Note (Signed)
1.  Right breast DCIS: -Diagnosed on 09/25/2008 after right breast biopsy, right breast lumpectomy and lymph node biopsy on 10/24/2008. -Pathology showed high-grade DCIS, negative margins, 1.9 cm, 0/4 lymph nodes positive, ER 99% positive, PR 59% positive, free margins, PT ISP N0. - She took Evista since then. - Recent ultrasound on 04/10/2019 showed right axillary lymph node, ultrasound-guided core biopsy shows reactive lymph node. - Right axillary lymph node excision biopsy on 05/21/2019 shows reactive lymphoid hyperplasia with sinus histiocytes. - I have told her to discontinue Evista. -She reports her PCP checks her yearly labs yearly. -Mammogram done on 04/02/2020 showed BI-RADS Category 1 negative. -She wishes to follow-up with her PCP yearly from here out.  2.  Osteopenia: -DEXA scan on 07/17/2018 shows T score of -1.4.  This is stable compared to DEXA scan in 2017. - She was counseled to take calcium and vitamin D supplements. -PCP will handle her next DEXA scan.  3.  Elevated liver enzymes: -She has elevated AST and ALT, mild at 61 and 36 respectively.  She had elevated LFTs last year also. - CT of the abdomen from 2011 showed mild diffuse fatty infiltration of the liver which could be contributing to enzyme elevation.

## 2020-05-07 ENCOUNTER — Ambulatory Visit (HOSPITAL_COMMUNITY): Payer: Medicare HMO | Attending: Internal Medicine | Admitting: Physical Therapy

## 2020-05-07 ENCOUNTER — Encounter (HOSPITAL_COMMUNITY): Payer: Self-pay | Admitting: Physical Therapy

## 2020-05-07 ENCOUNTER — Other Ambulatory Visit: Payer: Self-pay

## 2020-05-07 DIAGNOSIS — M5416 Radiculopathy, lumbar region: Secondary | ICD-10-CM | POA: Insufficient documentation

## 2020-05-07 DIAGNOSIS — R262 Difficulty in walking, not elsewhere classified: Secondary | ICD-10-CM | POA: Diagnosis not present

## 2020-05-07 NOTE — Therapy (Signed)
[facility] [address] [place], [place], [address] Phone: [phone]   Fax:  [phone]  Physical Therapy Treatment  Patient Details  Name: Maria Ritter MRN: [id number] Date of Birth: July 25, 1946 Referring Provider (PT): Leigh Aurora   Encounter Date: 05/07/2020   PT End of Session - 05/07/20 0920    Visit Number 2    Number of Visits 4    Date for PT Re-Evaluation 05/28/20    Authorization Type [employer] medicare    Authorization - Visit Number 2    Authorization - Number of Visits 4    Progress Note Due on Visit 4    PT Start Time [phone]    PT Stop Time 1004    PT Time Calculation (min) 41 min    Activity Tolerance Patient tolerated treatment well    Behavior During Therapy [facility] for tasks assessed/performed           Past Medical History:  Diagnosis Date   Breast cancer ([facility]) 2010   Right Lumpectomy and XRT   Carotid artery disease ([facility])    Mild to moderate bilateral ICA disease, left greater than right 07/2015   Essential hypertension    GERD (gastroesophageal reflux disease)    Glaucoma    Hyperlipidemia    Hypothyroidism    Osteoarthritis    Polymyalgia rheumatica ([facility])     Past Surgical History:  Procedure Laterality Date   AXILLARY LYMPH NODE BIOPSY Right 05/21/2019   Procedure: RIGHT AXILLARY LYMPH NODE BIOPSY;  Surgeon: Aviva Signs, MD;  Location: [facility];  Service: General;  Laterality: Right;   BASAL CELL CARCINOMA EXCISION  2010   Right breast   BREAST LUMPECTOMY Right 2010   ENDOMETRIAL ABLATION  2002   FINGER SURGERY     Minor surgical intervention following a crush injury to the left fourth finger   TOOTH EXTRACTION  01/2019    There were no vitals filed for this visit.   Subjective Assessment - 05/07/20 0926    Subjective PT states that she did her exercises about once every other day.    Pertinent History HTN,    Limitations Standing;Walking;House hold activities    How long can  you sit comfortably? no proble    How long can you stand comfortably? 5 minutes    How long can you walk comfortably? 10 minutes started using a cane due to her pain.    Patient Stated Goals less pain, walk and stand longer, walk without her cane    Currently in Pain? No/denies    Pain Score 2     Pain Location Back    Pain Orientation Right    Pain Descriptors / Indicators Aching    Pain Type Chronic pain    Pain Radiating Towards none    Pain Onset More than a month ago                  [facility]Exercise - 05/07/20 0001      Exercises   Exercises Lumbar      Lumbar Exercises: Stretches   Active Hamstring Stretch 3 reps;20 seconds    Single Knee to Chest Stretch Right;Left;3 reps;20 seconds      Lumbar Exercises: Seated   Sit to Stand 10 reps    Other Seated Lumbar Exercises scapular retraction x 10     Other Seated Lumbar Exercises sit as tall as possible x 5       Lumbar Exercises:  Supine   Bridge 15 reps    Other Supine Lumbar Exercises decompression 1-5                   PT Education - 05/07/20 0936    Education Details HEP    Person(s) Educated Patient    Methods Explanation    Comprehension Verbalized understanding            PT Short Term Goals - 05/07/20 0925      PT SHORT TERM GOAL #1   Title PT to be I in HEP to allow radicular pain to not go past mid thigh level.    Time 2    Period Weeks    Status On-going    Target Date 05/12/20      PT SHORT TERM GOAL #2   Title Pt pain to be no greater than a 4/10    Time 2    Period Weeks    Status On-going             PT Long Term Goals - 05/07/20 5573      PT LONG TERM GOAL #1   Title PT to be I in an advance HEP to decrease pain to no greater than a 2/10    Time 4    Period Weeks    Status On-going      PT LONG TERM GOAL #2   Title Pt pain to be in her hip area only    Time 4    Period Weeks    Status On-going      PT LONG TERM GOAL #3   Title Pt to be  able to single leg stance for 30 seconds bilaterally to allow pt to feel confident in waliking without her cane .    Time 4    Period Weeks    Status On-going                 Plan - 05/07/20 2202    Clinical Impression Statement Reviewed evaluation and goals with patient.  Reviewed decompression exercises, added knee to chest, scapular retraction and hamstring stretches.    Personal Factors and Comorbidities Fitness;Age    Examination-Activity Limitations Carry;Lift;Locomotion Level;Squat;Stand    Examination-Participation Restrictions Cleaning;Laundry;Yard Work;Shop    Stability/Clinical Decision Making Evolving/Moderate complexity    Rehab Potential Good    PT Frequency 1x / week    PT Duration 4 weeks    PT Treatment/Interventions Therapeutic activities;Functional mobility training;Therapeutic exercise;Patient/family education;Manual techniques    PT Next Visit Plan , decompression t band exercises,poe and manual to improve lumbar movemetn.    PT Home Exercise Plan sit to stand , bridge, knee to chest, hamstring stretch; scapular retraction, decompression 1-5           Patient will benefit from skilled therapeutic intervention in order to improve the following deficits and impairments:  Decreased activity tolerance, Cardiopulmonary status limiting activity, Decreased range of motion, Decreased strength, Difficulty walking, Pain, Postural dysfunction  Visit Diagnosis: Right lumbar radiculopathy  Difficulty in walking, not elsewhere classified     Problem List Patient Active Problem List   Diagnosis Date Noted   Abnormal stress test 08/02/2019   Postinflammatory skin changes 06/14/2019   Encounter for colorectal cancer screening 06/14/2019   Encounter for gynecological examination with Papanicolaou smear of cervix 06/14/2019   Lymph node enlargement    Flexural atopic dermatitis 01/31/2018   Itching 11/01/2017   Seasonal and perennial allergic rhinitis  11/01/2017  History of breast cancer in female 04/21/2016   Routine cervical smear 04/21/2016   Vaginal atrophy 04/21/2016   OVERWEIGHT 01/28/2010   LEG CRAMPS, NOCTURNAL 01/28/2010   ELECTROCARDIOGRAM, ABNORMAL 01/28/2010   CARCINOMA IN SITU, RIGHT BREAST 11/27/2009   HYPERLIPIDEMIA 11/27/2009   ARM PAIN, LEFT 11/27/2009   HYPOTHYROIDISM 11/26/2009   Essential hypertension 11/26/2009    Rayetta Humphrey, PT CLT [phone] 05/07/2020, 10:01 AM  [facility] [place], [place], [address] Phone: [phone]   Fax:  [phone]  Name: Maria Ritter MRN: [id number] Date of Birth: [birth year]

## 2020-05-08 DIAGNOSIS — E1122 Type 2 diabetes mellitus with diabetic chronic kidney disease: Secondary | ICD-10-CM | POA: Diagnosis not present

## 2020-05-08 DIAGNOSIS — Z23 Encounter for immunization: Secondary | ICD-10-CM | POA: Diagnosis not present

## 2020-05-08 DIAGNOSIS — N1831 Chronic kidney disease, stage 3a: Secondary | ICD-10-CM | POA: Diagnosis not present

## 2020-05-12 ENCOUNTER — Ambulatory Visit (HOSPITAL_COMMUNITY): Payer: Medicare HMO | Admitting: Physical Therapy

## 2020-05-12 ENCOUNTER — Other Ambulatory Visit: Payer: Self-pay

## 2020-05-12 DIAGNOSIS — R262 Difficulty in walking, not elsewhere classified: Secondary | ICD-10-CM

## 2020-05-12 DIAGNOSIS — M5416 Radiculopathy, lumbar region: Secondary | ICD-10-CM

## 2020-05-12 NOTE — Patient Instructions (Signed)
Straight Leg Raise    Tighten stomach and slowly raise locked right leg _15__ inches from floor. Repeat _10__ times per set.Do __2_ sessions per day.    Knee Roll    Lying on back, with knees bent and feet flat on floor, arms outstretched to sides, slowly roll both knees to side, hold 5 seconds. Back to starting position, hold 10 seconds. Then to opposite side, hold 10 seconds. Return to starting position. Keep shoulders and arms in contact with floor.  Do _10__ reps, complete__2__ times a day

## 2020-05-12 NOTE — Therapy (Signed)
[place], [place], [address] Phone: [phone]   Fax:  [phone]  Physical Therapy Treatment  Patient Details  Name: Maria Ritter MRN: [id number] Date of Birth: 1946/08/11 Referring Provider (PT): Leigh Aurora   Encounter Date: 05/12/2020   PT End of Session - 05/12/20 1325    Visit Number 3    Number of Visits 4    Date for PT Re-Evaluation 05/28/20    Authorization Type [employer] medicare    Authorization - Visit Number 3    Authorization - Number of Visits 4    Progress Note Due on Visit 4    PT Start Time 0840    PT Stop Time 0923    PT Time Calculation (min) 43 min    Activity Tolerance Patient tolerated treatment well    Behavior During Therapy [facility] for tasks assessed/performed           Past Medical History:  Diagnosis Date  . Breast cancer ([place]) 2010   Right Lumpectomy and XRT  . Carotid artery disease (H[facility])    Mild to moderate bilateral ICA disease, left greater than right 07/2015  . Essential hypertension   . GERD (gastroesophageal reflux disease)   . Glaucoma   . Hyperlipidemia   . Hypothyroidism   . Osteoarthritis   . Polymyalgia rheumatica (HCC)     Past Surgical History:  Procedure Laterality Date  . AXILLARY LYMPH NODE BIOPSY Right 05/21/2019   Procedure: RIGHT AXILLARY LYMPH NODE BIOPSY;  Surgeon: Aviva Signs, MD;  Location: [facility];  Service: General;  Laterality: Right;  . BASAL CELL CARCINOMA EXCISION  2010   Right breast  . BREAST LUMPECTOMY Right 2010  . ENDOMETRIAL ABLATION  2002  . FINGER SURGERY     Minor surgical intervention following a crush injury to the left fourth finger  . TOOTH EXTRACTION  01/2019    There were no vitals filed for this visit.   Subjective Assessment - 05/12/20 0917    Subjective pt states she really has not hurt much lateley and feels like these exercises are helping.  Reports compliance with HEP.    Currently in Pain? No/denies                              [facility] Adult PT Treatment/Exercise - 05/12/20 0001      Lumbar Exercises: Stretches   Active Hamstring Stretch 3 reps;20 seconds    Active Hamstring Stretch Limitations with towel    Lower Trunk Rotation 10 seconds    Lower Trunk Rotation Limitations 10 reps to each side    Other Lumbar Stretch Exercise lumbar excursions.       Lumbar Exercises: Supine   Bridge 10 reps    Bridge Limitations 2 sets    Straight Leg Raise 10 reps    Straight Leg Raises Limitations 2 sets    Other Supine Lumbar Exercises theraband decompression 1-4 RTB 5X each    Other Supine Lumbar Exercises decompression 1-5                     PT Short Term Goals - 05/07/20 0925      PT SHORT TERM GOAL #1   Title PT to be I in HEP to allow radicular pain to not go past mid thigh level.    Time 2    Period Weeks    Status On-going  Target Date 05/12/20      PT SHORT TERM GOAL #2   Title Pt pain to be no greater than a 4/10    Time 2    Period Weeks    Status On-going             PT Long Term Goals - 05/07/20 6720      PT LONG TERM GOAL #1   Title PT to be I in an advance HEP to decrease pain to no greater than a 2/10    Time 4    Period Weeks    Status On-going      PT LONG TERM GOAL #2   Title Pt pain to be in her hip area only    Time 4    Period Weeks    Status On-going      PT LONG TERM GOAL #3   Title Pt to be able to single leg stance for 30 seconds bilaterally to allow pt to feel confident in waliking without her cane .    Time 4    Period Weeks    Status On-going                 Plan - 05/12/20 1324    Clinical Impression Statement Pt with overall reducing pain and dysfunction since begining therapy.  Progressed on with SLR's and Decompression therband exercises with tactile cues for form.  Pt continues to require verbal and tactile cues with existing therex as well.  Lower trunk rotations also added with patient reporting  good results with stretch.  Pt requested printouts of added therex this session.  No return of pain or complaints at end of session    Personal Factors and Comorbidities Fitness;Age    Examination-Activity Limitations Carry;Lift;Locomotion Level;Squat;Stand    Examination-Participation Restrictions Cleaning;Laundry;Yard Work;Shop    Stability/Clinical Decision Making Evolving/Moderate complexity    Rehab Potential Good    PT Frequency 1x / week    PT Duration 4 weeks    PT Treatment/Interventions Therapeutic activities;Functional mobility training;Therapeutic exercise;Patient/family education;Manual techniques    PT Next Visit Plan Next session begin POE and manual to improve lumbar movement.    PT Home Exercise Plan sit to stand , bridge, knee to chest, hamstring stretch; scapular retraction, decompression 1-5  9/13:  decompression tband, red tband, LTR           Patient will benefit from skilled therapeutic intervention in order to improve the following deficits and impairments:  Decreased activity tolerance, Cardiopulmonary status limiting activity, Decreased range of motion, Decreased strength, Difficulty walking, Pain, Postural dysfunction  Visit Diagnosis: Difficulty in walking, not elsewhere classified  Right lumbar radiculopathy     Problem List Patient Active Problem List   Diagnosis Date Noted  . Abnormal stress test 08/02/2019  . Postinflammatory skin changes 06/14/2019  . Encounter for colorectal cancer screening 06/14/2019  . Encounter for gynecological examination with Papanicolaou smear of cervix 06/14/2019  . Lymph node enlargement   . Flexural atopic dermatitis 01/31/2018  . Itching 11/01/2017  . Seasonal and perennial allergic rhinitis 11/01/2017  . History of breast cancer in female 04/21/2016  . Routine cervical smear 04/21/2016  . Vaginal atrophy 04/21/2016  . OVERWEIGHT 01/28/2010  . LEG CRAMPS, NOCTURNAL 01/28/2010  . ELECTROCARDIOGRAM, ABNORMAL  01/28/2010  . CARCINOMA IN SITU, RIGHT BREAST 11/27/2009  . HYPERLIPIDEMIA 11/27/2009  . ARM PAIN, LEFT 11/27/2009  . HYPOTHYROIDISM 11/26/2009  . Essential hypertension 11/26/2009   Teena Irani, PTA/CLT [phone]  Teena Irani 05/12/2020, 1:26 PM  [facility] [address] [place], [place], [address] Phone: [phone]   Fax:  [phone]  Name: MARLISS BUTTACAVOLI MRN: [id number] Date of Birth: [birth year]

## 2020-05-14 ENCOUNTER — Encounter (HOSPITAL_COMMUNITY): Payer: Medicare HMO | Admitting: Physical Therapy

## 2020-05-19 ENCOUNTER — Ambulatory Visit (HOSPITAL_COMMUNITY): Payer: Medicare HMO | Admitting: Physical Therapy

## 2020-05-19 ENCOUNTER — Other Ambulatory Visit: Payer: Self-pay

## 2020-05-19 ENCOUNTER — Encounter (HOSPITAL_COMMUNITY): Payer: Self-pay | Admitting: Physical Therapy

## 2020-05-19 DIAGNOSIS — M5416 Radiculopathy, lumbar region: Secondary | ICD-10-CM | POA: Diagnosis not present

## 2020-05-19 DIAGNOSIS — R262 Difficulty in walking, not elsewhere classified: Secondary | ICD-10-CM | POA: Diagnosis not present

## 2020-05-19 IMAGING — US ULTRASOUND RIGHT BREAST LIMITED
1 series · 12 of 12 positions shown · non-contrast
Comparison: Previous exam(s).

CLINICAL DATA: Screening recall for a possible mass or enlarged
lymph node in the right axilla. Patient has a history right
lumpectomy 4111 for breast carcinoma.

EXAM:
ULTRASOUND OF THE BILATERAL AXILLA

[Series 1: ultrasound right breast limited · 0.07mm/px · 12 of 12 slices shown]
[im 1/12]
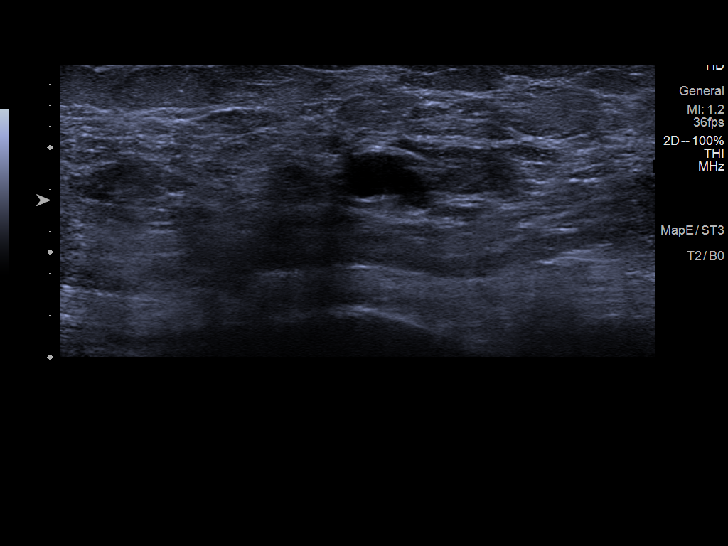
[im 2/12]
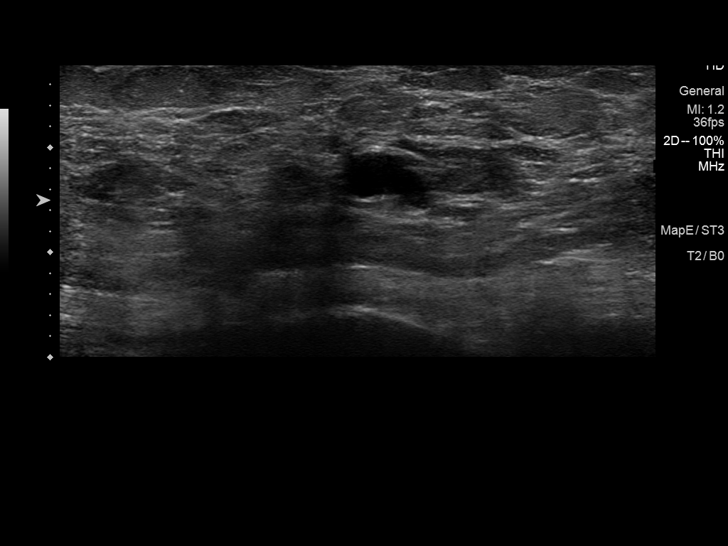
[im 3/12]
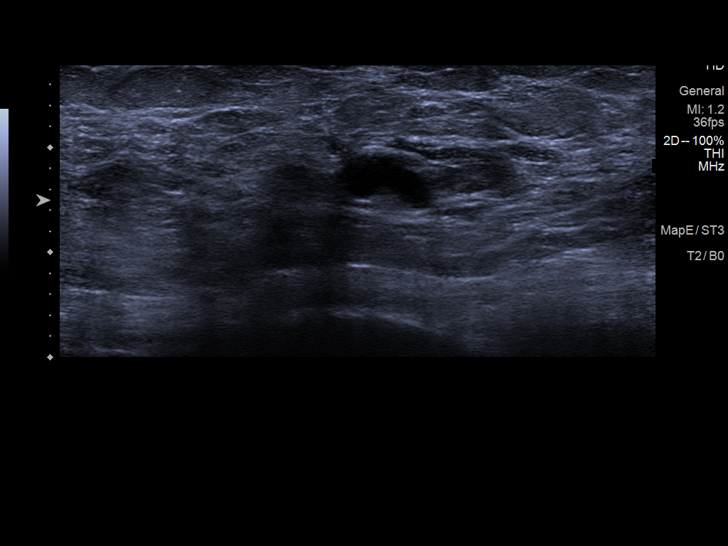
[im 4/12]
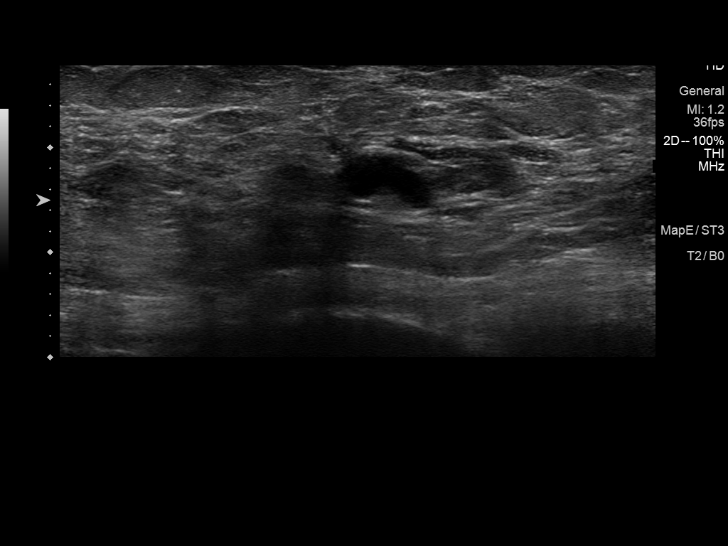
[im 5/12]
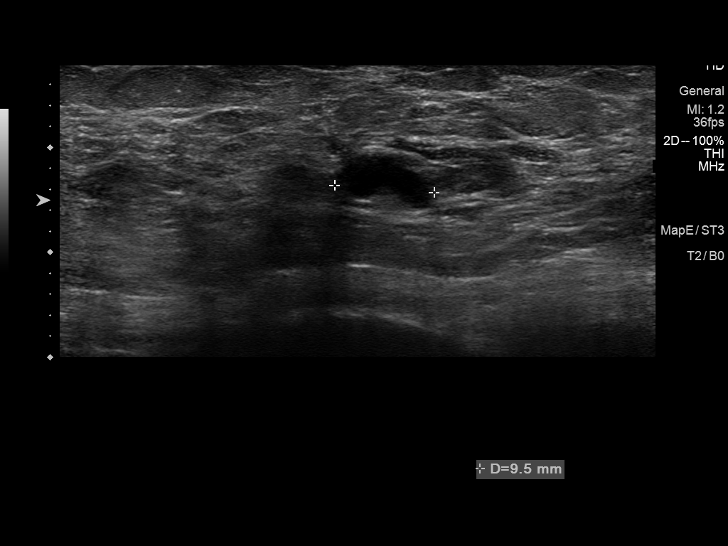
[im 6/12]
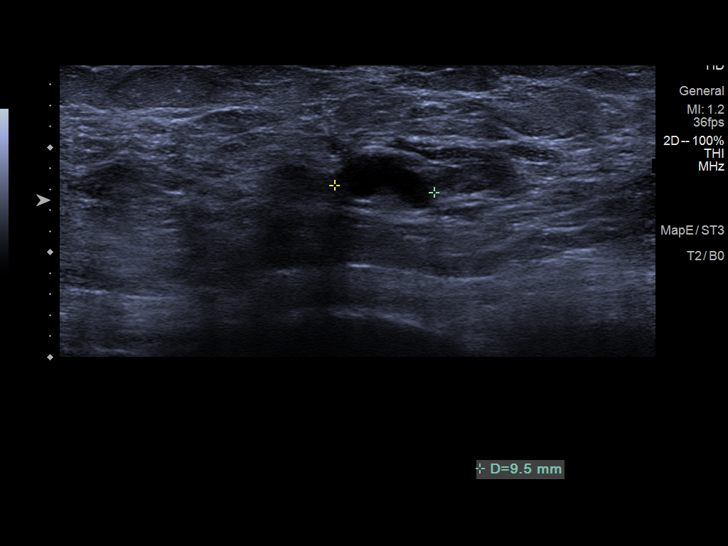
[im 7/12]
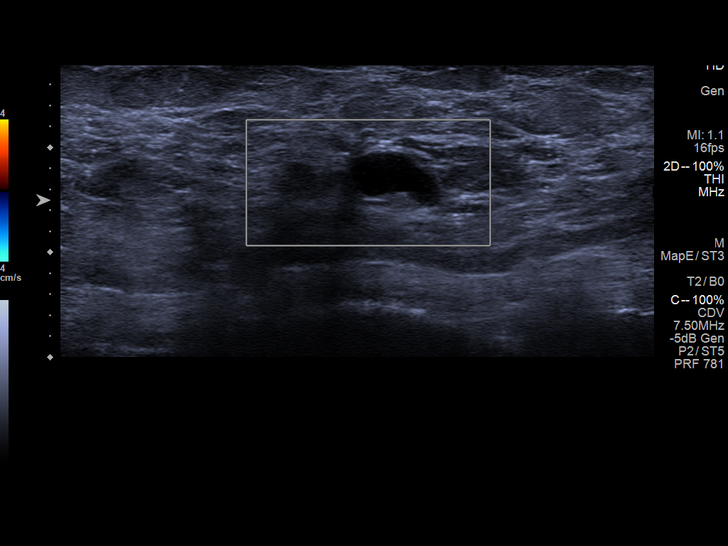
[im 8/12]
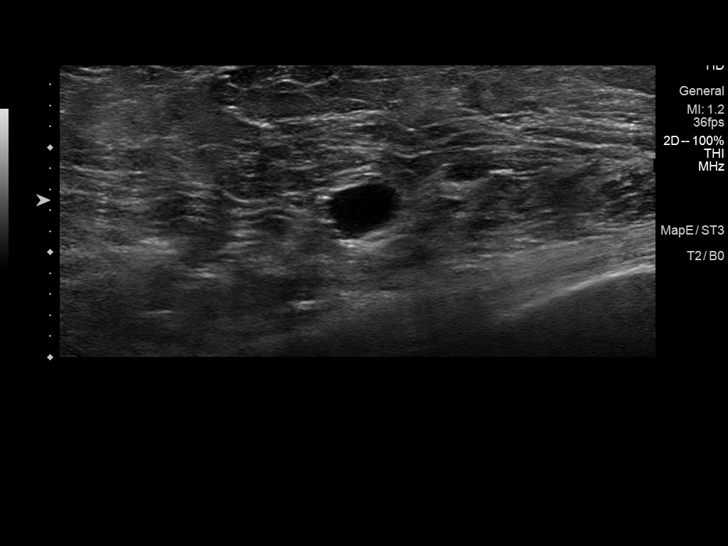
[im 9/12]
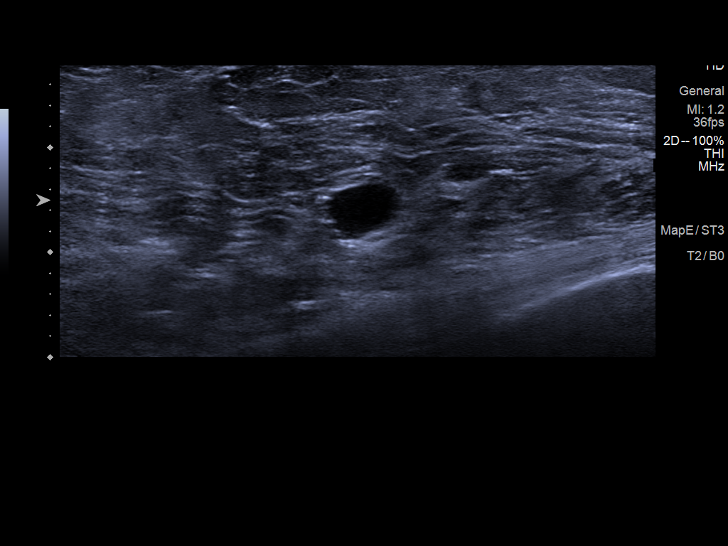
[im 10/12]
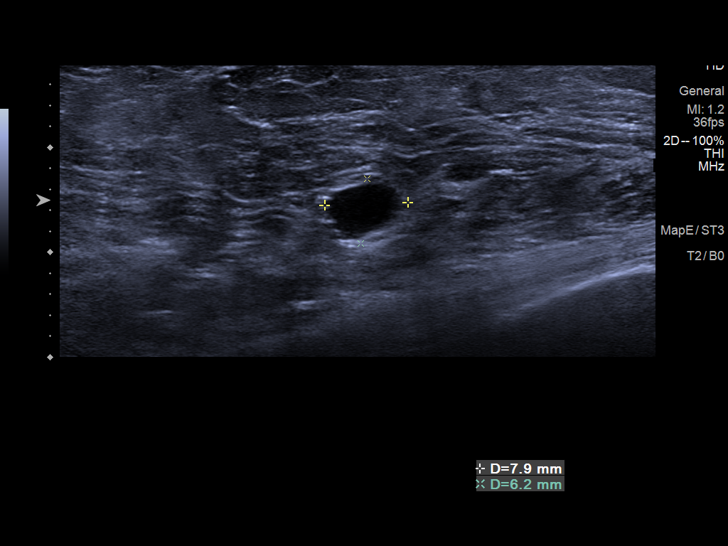
[im 11/12]
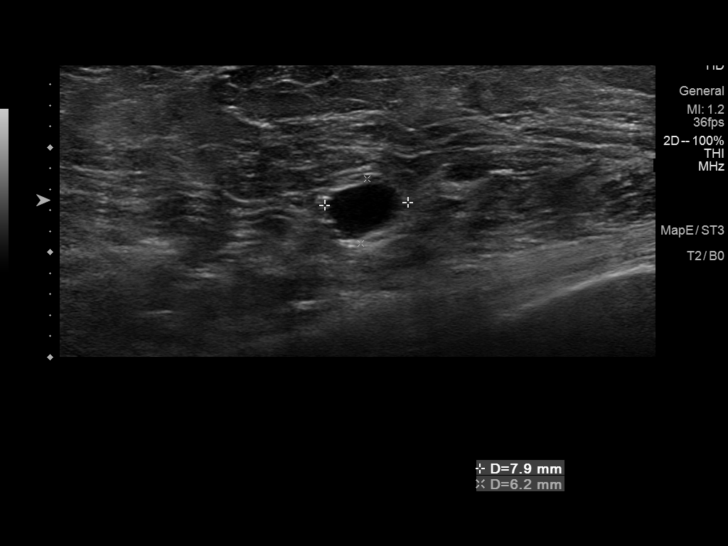
[im 12/12]
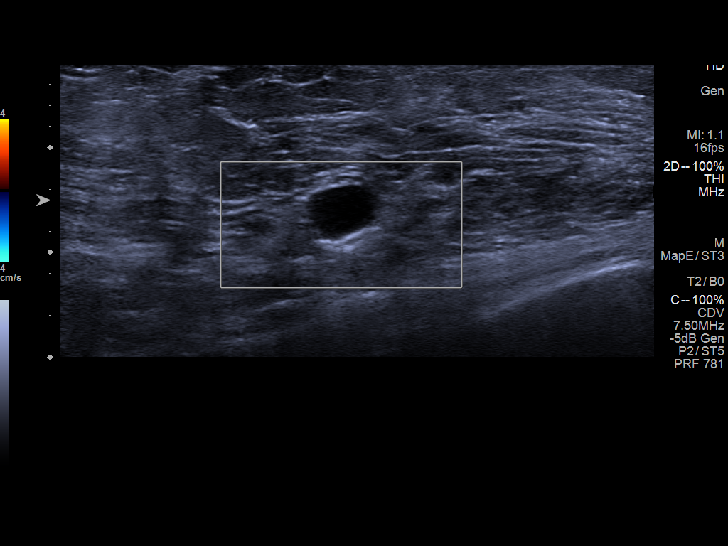

[12 of 12 positions shown; findings below may reference images not displayed]

FINDINGS: On physical exam, no mass is palpated in the right axilla.

Targeted right axilla ultrasound is performed, showing a single
abnormal lymph node, measuring 1.1 cm in short axis, with no hilar
fat. This corresponds to the abnormal lymph node/mass seen
mammographically. No other abnormal right axillary lymph nodes.

Targeted ultrasound is performed, showing several normal sized,
normal morphology lymph nodes. No abnormal left axillary lymph
nodes.
IMPRESSION: 1. Single enlarged right axillary lymph node with abnormal
morphology. Tissue sampling is indicated.

RECOMMENDATION:
1. Ultrasound-guided core needle biopsy of the abnormal right
axillary lymph node.

I have discussed the findings and recommendations with the patient.
Results were also provided in writing at the conclusion of the
visit. If applicable, a reminder letter will be sent to the patient
regarding the next appointment.

BI-RADS CATEGORY  4: Suspicious.

## 2020-05-19 NOTE — Therapy (Signed)
[facility] [address] [place], [place], [address] Phone: [phone]   Fax:  [phone]  Physical Therapy Treatment  Patient Details  Name: Maria Ritter MRN: [id number] Date of Birth: 20-Feb-1946 Referring Provider (PT): Leigh Aurora  PHYSICAL THERAPY DISCHARGE SUMMARY  Visits from Start of Care: 4  Current functional level related to goals / functional outcomes: See below    Remaining deficits: See below    Education / Equipment: See assessment  Plan: Patient agrees to discharge.  Patient goals were partially met. Patient is being discharged due to being pleased with the current functional level.  ?????      Encounter Date: 05/19/2020   PT End of Session - 05/19/20 0817    Visit Number 4    Number of Visits 4    Date for PT Re-Evaluation 05/28/20    Authorization Type [employer] medicare    Authorization - Visit Number 4    Authorization - Number of Visits 4    Progress Note Due on Visit 4    PT Start Time 0820    PT Stop Time 0915    PT Time Calculation (min) 55 min    Activity Tolerance Patient tolerated treatment well    Behavior During Therapy WFL for tasks assessed/performed           Past Medical History:  Diagnosis Date  . Breast cancer ([place]) 2010   Right Lumpectomy and XRT  . Carotid artery disease (HCC)    Mild to moderate bilateral ICA disease, left greater than right 07/2015  . Essential hypertension   . GERD (gastroesophageal reflux disease)   . Glaucoma   . Hyperlipidemia   . Hypothyroidism   . Osteoarthritis   . Polymyalgia rheumatica (HCC)     Past Surgical History:  Procedure Laterality Date  . AXILLARY LYMPH NODE BIOPSY Right 05/21/2019   Procedure: RIGHT AXILLARY LYMPH NODE BIOPSY;  Surgeon: Aviva Signs, MD;  Location: [facility];  Service: General;  Laterality: Right;  . BASAL CELL CARCINOMA EXCISION  2010   Right breast  . BREAST LUMPECTOMY Right 2010  . ENDOMETRIAL ABLATION  2002  .  FINGER SURGERY     Minor surgical intervention following a crush injury to the left fourth finger  . TOOTH EXTRACTION  01/2019    There were no vitals filed for this visit.   Subjective Assessment - 05/19/20 0829    Subjective Patient says she feels that exercises have been helpful. Says she has some trouble doing HEP on her bed because it is soft. Patetin says she has ongoing aching, but has had less pain. Patient reports about 50% improvement since starting therapy.    Pertinent History HTN,    Limitations Standing;Walking;House hold activities    How long can you stand comfortably? 5 minutes    How long can you walk comfortably? 10 minutes started using a cane due to her pain.    Patient Stated Goals less pain, walk and stand longer, walk without her cane    Currently in Pain? No/denies   no pain, just aching             [facility] PT Assessment - 05/19/20 0001      Assessment   Medical Diagnosis Radicular back pain     Referring Provider (PT) Leigh Aurora    Onset Date/Surgical Date 08/14/19    Prior Therapy cardiac       Precautions   Precautions None  Restrictions   Weight Bearing Restrictions No      Prior Function   Level of Independence Independent      Cognition   Overall Cognitive Status Within Functional Limits for tasks assessed      Observation/Other Assessments   Focus on Therapeutic Outcomes (FOTO)  46% limited    was 49% limited      Balance   Balance Assessed Yes      Static Standing Balance   Static Standing Balance -  Activities  Tandam Stance - Right Leg;Tandam Stance - Left Leg    Static Standing - Comment/# of Minutes 14 sec, 16 sec                          [facility] Adult PT Treatment/Exercise - 05/19/20 0001      Lumbar Exercises: Supine   Ab Set 5 reps;5 seconds    Bridge 5 reps;5 seconds    Straight Leg Raise 5 reps                  PT Education - 05/19/20 1317    Education Details on transition to HEP for DC     Person(s) Educated Patient    Methods Explanation;Handout    Comprehension Verbalized understanding            PT Short Term Goals - 05/19/20 0831      PT SHORT TERM GOAL #1   Title PT to be I in HEP to allow radicular pain to not go past mid thigh level.    Baseline Reports centralization to posterior hip area    Time 2    Period Weeks    Status Achieved    Target Date 05/12/20      PT SHORT TERM GOAL #2   Title Pt pain to be no greater than a 4/10    Baseline Reports 3-4/10 average    Time 2    Period Weeks    Status Achieved             PT Long Term Goals - 05/19/20 0258      PT LONG TERM GOAL #1   Title PT to be I in an advance HEP to decrease pain to no greater than a 2/10    Baseline Reports compliance with HEP but pain still 3-4/10 intermittently depending on level of activity    Time 4    Period Weeks    Status Not Met      PT LONG TERM GOAL #2   Title Pt pain to be in her hip area only    Baseline Reports centralization to posterior hip area    Time 4    Period Weeks    Status Achieved      PT LONG TERM GOAL #3   Title Pt to be able to single leg stance for 30 seconds bilaterally to allow pt to feel confident in walking without her cane.    Baseline See objective measures    Time 4    Period Weeks    Status Not Met                 Plan - 05/19/20 1321    Clinical Impression Statement Patient has made some progress toward long term therapy goals. Patient reports overall decrease in symptom severity and centralization of radicular pain. Patient still limited by ongoing pain with activity and balance deficits. Patient says she feels that exercises  are helping and feels comfortable with HEP mostly. Was unsure of form with a few of the more recent band decompression exercises. Performed HEP review, including verbal cues, demo and updated HEP handout with instruction to video link of all prescribed ther ex. Patetin had several questions regarding  HEP, progressions and activity to avoid. All patient questions answered. Patient agrees with plan to DC today to HEP and was instructed to follow up with therapy services with any further questions or concerns. Patetin educated on and issued updated HEP handout.    Personal Factors and Comorbidities Fitness;Age    Examination-Activity Limitations Carry;Lift;Locomotion Level;Squat;Stand    Examination-Participation Restrictions Cleaning;Laundry;Yard Work;Shop    Stability/Clinical Decision Making Evolving/Moderate complexity    Rehab Potential Good    PT Treatment/Interventions Therapeutic activities;Functional mobility training;Therapeutic exercise;Patient/family education;Manual techniques    PT Next Visit Plan DC to HEP    PT Home Exercise Plan sit to stand , bridge, knee to chest, hamstring stretch; scapular retraction, decompression 1-5  9/13:  decompression tband, red tband, LTR           Patient will benefit from skilled therapeutic intervention in order to improve the following deficits and impairments:  Decreased activity tolerance, Cardiopulmonary status limiting activity, Decreased range of motion, Decreased strength, Difficulty walking, Pain, Postural dysfunction  Visit Diagnosis: Difficulty in walking, not elsewhere classified  Right lumbar radiculopathy     Problem List Patient Active Problem List   Diagnosis Date Noted  . Abnormal stress test 08/02/2019  . Postinflammatory skin changes 06/14/2019  . Encounter for colorectal cancer screening 06/14/2019  . Encounter for gynecological examination with Papanicolaou smear of cervix 06/14/2019  . Lymph node enlargement   . Flexural atopic dermatitis 01/31/2018  . Itching 11/01/2017  . Seasonal and perennial allergic rhinitis 11/01/2017  . History of breast cancer in female 04/21/2016  . Routine cervical smear 04/21/2016  . Vaginal atrophy 04/21/2016  . OVERWEIGHT 01/28/2010  . LEG CRAMPS, NOCTURNAL 01/28/2010  .  ELECTROCARDIOGRAM, ABNORMAL 01/28/2010  . CARCINOMA IN SITU, RIGHT BREAST 11/27/2009  . HYPERLIPIDEMIA 11/27/2009  . ARM PAIN, LEFT 11/27/2009  . HYPOTHYROIDISM 11/26/2009  . Essential hypertension 11/26/2009   1:31 PM, 05/19/20 Josue Hector PT DPT  Physical Therapist with [facility]  [phone] [place] [address] [place], [place], [address] Phone: [phone]   Fax:  [phone]  Name: Maria Ritter MRN: [id number] Date of Birth: 04/03/1946

## 2020-05-19 NOTE — Patient Instructions (Addendum)
  Access Code: [facility] URL: [email or web address] Date: 05/19/2020 Prepared by: Josue Hector  Exercises Hook Lying Single Knee to Chest Stretch with Towel - 1 x daily - 4 x weekly - 1 sets - 5 reps - 10 sec hold Supine Lower Trunk Rotation - 1 x daily - 4 x weekly - 1 sets - 5 reps - 10 sec hold Supine Transversus Abdominis Bracing - Hands on Stomach - 1 x daily - 4 x weekly - 2 sets - 10 reps - 5 sec hold Supine March - 1 x daily - 4 x weekly - 2 sets - 10 reps Supine Bridge - 1 x daily - 4 x weekly - 2 sets - 10 reps - 5 sec hold Active Straight Leg Raise with Quad Set - 1 x daily - 4 x weekly - 2 sets - 10 reps Sit to Stand without Arm Support - 1 x daily - 4 x weekly - 2 sets - 10 reps Supine Shoulder External Rotation with Resistance - 1 x daily - 4 x weekly - 2 sets - 10 reps Supine Shoulder Horizontal Abduction with Resistance - 1 x daily - 4 x weekly - 2 sets - 10 reps

## 2020-05-21 ENCOUNTER — Encounter (HOSPITAL_COMMUNITY): Payer: Medicare HMO | Admitting: Physical Therapy

## 2020-05-26 ENCOUNTER — Encounter (HOSPITAL_COMMUNITY): Payer: Medicare HMO | Admitting: Physical Therapy

## 2020-05-28 ENCOUNTER — Encounter (HOSPITAL_COMMUNITY): Payer: Medicare HMO | Admitting: Physical Therapy

## 2020-06-19 ENCOUNTER — Ambulatory Visit: Payer: Medicare HMO | Attending: Internal Medicine

## 2020-06-19 DIAGNOSIS — Z23 Encounter for immunization: Secondary | ICD-10-CM

## 2020-06-19 NOTE — Progress Notes (Signed)
   Covid-19 Vaccination Clinic  Name:  Maria Ritter    MRN: [id number] DOB: 06-Feb-1946  06/19/2020  Maria Ritter was observed post Covid-19 immunization for 15 minutes without incident. She was provided with Vaccine Information Sheet and instruction to access the V-Safe system.   Maria Ritter was instructed to call 911 with any severe reactions post vaccine: Marland Kitchen Difficulty breathing  . Swelling of face and throat  . A fast heartbeat  . A bad rash all over body  . Dizziness and weakness

## 2020-07-09 ENCOUNTER — Other Ambulatory Visit: Payer: Self-pay | Admitting: Allergy & Immunology

## 2020-07-15 DIAGNOSIS — I739 Peripheral vascular disease, unspecified: Secondary | ICD-10-CM | POA: Diagnosis not present

## 2020-07-21 DIAGNOSIS — L821 Other seborrheic keratosis: Secondary | ICD-10-CM | POA: Diagnosis not present

## 2020-07-21 DIAGNOSIS — L570 Actinic keratosis: Secondary | ICD-10-CM | POA: Diagnosis not present

## 2020-07-21 DIAGNOSIS — L218 Other seborrheic dermatitis: Secondary | ICD-10-CM | POA: Diagnosis not present

## 2020-07-21 DIAGNOSIS — L280 Lichen simplex chronicus: Secondary | ICD-10-CM | POA: Diagnosis not present

## 2020-07-21 DIAGNOSIS — M25551 Pain in right hip: Secondary | ICD-10-CM | POA: Diagnosis not present

## 2020-07-30 DIAGNOSIS — M5030 Other cervical disc degeneration, unspecified cervical region: Secondary | ICD-10-CM | POA: Diagnosis not present

## 2020-07-30 DIAGNOSIS — M8580 Other specified disorders of bone density and structure, unspecified site: Secondary | ICD-10-CM | POA: Diagnosis not present

## 2020-07-30 DIAGNOSIS — M25551 Pain in right hip: Secondary | ICD-10-CM | POA: Diagnosis not present

## 2020-07-30 DIAGNOSIS — M25561 Pain in right knee: Secondary | ICD-10-CM | POA: Diagnosis not present

## 2020-07-30 DIAGNOSIS — R945 Abnormal results of liver function studies: Secondary | ICD-10-CM | POA: Diagnosis not present

## 2020-07-30 DIAGNOSIS — L4059 Other psoriatic arthropathy: Secondary | ICD-10-CM | POA: Diagnosis not present

## 2020-07-30 DIAGNOSIS — R220 Localized swelling, mass and lump, head: Secondary | ICD-10-CM | POA: Diagnosis not present

## 2020-07-30 DIAGNOSIS — M5441 Lumbago with sciatica, right side: Secondary | ICD-10-CM | POA: Diagnosis not present

## 2020-07-30 DIAGNOSIS — Z6830 Body mass index (BMI) 30.0-30.9, adult: Secondary | ICD-10-CM | POA: Diagnosis not present

## 2020-07-30 DIAGNOSIS — E669 Obesity, unspecified: Secondary | ICD-10-CM | POA: Diagnosis not present

## 2020-07-30 HISTORY — PX: TOE SURGERY: SHX1073

## 2020-08-04 DIAGNOSIS — H40013 Open angle with borderline findings, low risk, bilateral: Secondary | ICD-10-CM | POA: Diagnosis not present

## 2020-08-04 DIAGNOSIS — Z961 Presence of intraocular lens: Secondary | ICD-10-CM | POA: Diagnosis not present

## 2020-08-04 DIAGNOSIS — H43811 Vitreous degeneration, right eye: Secondary | ICD-10-CM | POA: Diagnosis not present

## 2020-08-05 DIAGNOSIS — M79674 Pain in right toe(s): Secondary | ICD-10-CM | POA: Diagnosis not present

## 2020-08-05 DIAGNOSIS — L600 Ingrowing nail: Secondary | ICD-10-CM | POA: Diagnosis not present

## 2020-08-05 DIAGNOSIS — L03031 Cellulitis of right toe: Secondary | ICD-10-CM | POA: Diagnosis not present

## 2020-08-05 DIAGNOSIS — M79671 Pain in right foot: Secondary | ICD-10-CM | POA: Diagnosis not present

## 2020-08-11 DIAGNOSIS — R69 Illness, unspecified: Secondary | ICD-10-CM | POA: Diagnosis not present

## 2020-08-13 ENCOUNTER — Ambulatory Visit: Payer: Medicare HMO | Admitting: Allergy & Immunology

## 2020-08-19 DIAGNOSIS — M79674 Pain in right toe(s): Secondary | ICD-10-CM | POA: Diagnosis not present

## 2020-08-19 DIAGNOSIS — M79671 Pain in right foot: Secondary | ICD-10-CM | POA: Diagnosis not present

## 2020-08-19 DIAGNOSIS — L600 Ingrowing nail: Secondary | ICD-10-CM | POA: Diagnosis not present

## 2020-08-25 DIAGNOSIS — M25562 Pain in left knee: Secondary | ICD-10-CM | POA: Diagnosis not present

## 2020-08-25 DIAGNOSIS — M1711 Unilateral primary osteoarthritis, right knee: Secondary | ICD-10-CM | POA: Diagnosis not present

## 2020-08-25 DIAGNOSIS — Z0001 Encounter for general adult medical examination with abnormal findings: Secondary | ICD-10-CM | POA: Diagnosis not present

## 2020-08-25 DIAGNOSIS — E7849 Other hyperlipidemia: Secondary | ICD-10-CM | POA: Diagnosis not present

## 2020-08-25 DIAGNOSIS — M62838 Other muscle spasm: Secondary | ICD-10-CM | POA: Diagnosis not present

## 2020-08-25 DIAGNOSIS — L603 Nail dystrophy: Secondary | ICD-10-CM | POA: Diagnosis not present

## 2020-08-25 DIAGNOSIS — M542 Cervicalgia: Secondary | ICD-10-CM | POA: Diagnosis not present

## 2020-08-25 DIAGNOSIS — Z79899 Other long term (current) drug therapy: Secondary | ICD-10-CM | POA: Diagnosis not present

## 2020-08-25 DIAGNOSIS — R7301 Impaired fasting glucose: Secondary | ICD-10-CM | POA: Diagnosis not present

## 2020-08-25 DIAGNOSIS — Z23 Encounter for immunization: Secondary | ICD-10-CM | POA: Diagnosis not present

## 2020-08-25 DIAGNOSIS — Z6829 Body mass index (BMI) 29.0-29.9, adult: Secondary | ICD-10-CM | POA: Diagnosis not present

## 2020-08-28 DIAGNOSIS — E039 Hypothyroidism, unspecified: Secondary | ICD-10-CM | POA: Diagnosis not present

## 2020-08-28 DIAGNOSIS — L309 Dermatitis, unspecified: Secondary | ICD-10-CM | POA: Diagnosis not present

## 2020-08-28 DIAGNOSIS — E782 Mixed hyperlipidemia: Secondary | ICD-10-CM | POA: Diagnosis not present

## 2020-08-28 DIAGNOSIS — M170 Bilateral primary osteoarthritis of knee: Secondary | ICD-10-CM | POA: Diagnosis not present

## 2020-08-28 DIAGNOSIS — R945 Abnormal results of liver function studies: Secondary | ICD-10-CM | POA: Diagnosis not present

## 2020-08-28 DIAGNOSIS — I2510 Atherosclerotic heart disease of native coronary artery without angina pectoris: Secondary | ICD-10-CM | POA: Diagnosis not present

## 2020-08-28 DIAGNOSIS — R7301 Impaired fasting glucose: Secondary | ICD-10-CM | POA: Diagnosis not present

## 2020-08-28 DIAGNOSIS — Z853 Personal history of malignant neoplasm of breast: Secondary | ICD-10-CM | POA: Diagnosis not present

## 2020-08-28 DIAGNOSIS — I10 Essential (primary) hypertension: Secondary | ICD-10-CM | POA: Diagnosis not present

## 2020-08-28 DIAGNOSIS — J309 Allergic rhinitis, unspecified: Secondary | ICD-10-CM | POA: Diagnosis not present

## 2020-08-29 DIAGNOSIS — I10 Essential (primary) hypertension: Secondary | ICD-10-CM | POA: Diagnosis not present

## 2020-08-29 DIAGNOSIS — E7849 Other hyperlipidemia: Secondary | ICD-10-CM | POA: Diagnosis not present

## 2020-08-29 DIAGNOSIS — Z23 Encounter for immunization: Secondary | ICD-10-CM | POA: Diagnosis not present

## 2020-08-29 DIAGNOSIS — K219 Gastro-esophageal reflux disease without esophagitis: Secondary | ICD-10-CM | POA: Diagnosis not present

## 2020-09-08 DIAGNOSIS — L03031 Cellulitis of right toe: Secondary | ICD-10-CM | POA: Diagnosis not present

## 2020-09-08 DIAGNOSIS — M79671 Pain in right foot: Secondary | ICD-10-CM | POA: Diagnosis not present

## 2020-09-08 DIAGNOSIS — M79674 Pain in right toe(s): Secondary | ICD-10-CM | POA: Diagnosis not present

## 2020-09-08 DIAGNOSIS — L600 Ingrowing nail: Secondary | ICD-10-CM | POA: Diagnosis not present

## 2020-09-09 ENCOUNTER — Ambulatory Visit: Payer: Medicare HMO | Admitting: Cardiology

## 2020-09-10 ENCOUNTER — Other Ambulatory Visit: Payer: Self-pay

## 2020-09-10 ENCOUNTER — Encounter: Payer: Self-pay | Admitting: Allergy & Immunology

## 2020-09-10 ENCOUNTER — Ambulatory Visit: Payer: Medicare HMO | Admitting: Allergy & Immunology

## 2020-09-10 VITALS — BP 122/62 | HR 80 | Temp 97.5°F | Resp 18 | Ht 64.17 in | Wt 177.8 lb

## 2020-09-10 DIAGNOSIS — L299 Pruritus, unspecified: Secondary | ICD-10-CM

## 2020-09-10 DIAGNOSIS — L2089 Other atopic dermatitis: Secondary | ICD-10-CM

## 2020-09-10 MED ORDER — METHYLPREDNISOLONE ACETATE 80 MG/ML IJ SUSP: 40.0000 mg | Freq: Once | INTRAMUSCULAR | Status: DC

## 2020-09-10 MED ORDER — TRIAMCINOLONE ACETONIDE 0.1 % EX OINT: 1.0000 "application " | TOPICAL_OINTMENT | Freq: Two times a day (BID) | CUTANEOUS | 1 refills | Status: DC

## 2020-09-10 MED ORDER — METHYLPREDNISOLONE ACETATE 80 MG/ML IJ SUSP
80.0000 mg | Freq: Once | INTRAMUSCULAR | Status: AC
  Administered 2020-09-10: 80 mg via INTRAMUSCULAR

## 2020-09-10 NOTE — Patient Instructions (Addendum)
1. Itching with sensitizations to grasses, indoor molds, dust mites - Continue with Allegra (fexofenadine) 180-360mg  (one to two tablets) at night.  - Continue with Pepcid 20mg  1-2 times daily (this might help with the itching and this is available over the counter).   2. Eczema - Continue with moisturizing twice daily as as you are doing. - Stop current triamcinolone and start triamcinolone 0.1% ointment mixed 1:1 with Eucerin. - You can just mix it yourself to save money.  - You can put this over your entire body. - The Dupixent would help with your eczema.   - Tammy will reach out to you again to discuss costs and whatnot.  - DepoMedrol 80mg  injection given today.  - Labs ordered (we will get labs from Dr. Nevada Crane so we do not duplicate them).   3. Return in about 2 weeks (around 09/24/2020).    Please inform us of any Emergency Department visits, hospitalizations, or changes in symptoms. Call us before going to the ED for breathing or allergy symptoms since we might be able to fit you in for a sick visit. Feel free to contact us anytime with any questions, problems, or concerns.  It was a pleasure to see you again today!  Websites that have reliable patient information: 1. American Academy of Asthma, Allergy, and Immunology: www.aaaai.org 2. Food Allergy Research and Education (FARE): foodallergy.org 3. Mothers of Asthmatics: http://www.asthmacommunitynetwork.org 4. American College of Allergy, Asthma, and Immunology: www.acaai.org   COVID-19 Vaccine Information can be found at: ShippingScam.co.uk For questions related to vaccine distribution or appointments, please email [email or web address] or call [phone].     "Like" [place] on Facebook and Instagram for our latest updates!       Make sure you are registered to vote! If you have moved or changed any of your contact information, you will need to get this updated  before voting!  In some cases, you MAY be able to register to vote online: [email or web address]

## 2020-09-10 NOTE — Progress Notes (Signed)
FOLLOW UP  Date of Service/Encounter:  09/10/20   Assessment:   Itching-with some clear environmental allergic triggers   Eczema - on triamcinolone ointment BID PRN(interested in [place] today)  Perennialand seasonalallergicrhinitis(grasses, indoor molds and dust mites)  Elevated absolute eosinophil count of 700 (April 2019)  Plan/Recommendations:   1. Itching with sensitizations to grasses, indoor molds, dust mites - Continue with Allegra (fexofenadine) 180-360mg  (one to two tablets) at night.  - Continue with Pepcid 20mg  1-2 times daily (this might help with the itching and this is available over the counter).   2. Eczema - Continue with moisturizing twice daily as as you are doing. - Stop current triamcinolone and start triamcinolone 0.1% ointment mixed 1:1 with Eucerin. - You can just mix it yourself to save money.  - You can put this over your entire body. - The Dupixent would help with your eczema.   - Tammy will reach out to you again to discuss costs and whatnot.  - DepoMedrol 80mg  injection given today.  - Labs ordered (we will get labs from Dr. Nevada Crane so we do not duplicate them).   3. Return in about 2 weeks (around 09/24/2020).   Subjective:   Maria Ritter is a 75 y.o. female presenting today for follow up of  Chief Complaint  Patient presents with  . Other    Itching    Maria Ritter has a history of the following: Patient Active Problem List   Diagnosis Date Noted  . Abnormal stress test 08/02/2019  . Postinflammatory skin changes 06/14/2019  . Encounter for colorectal cancer screening 06/14/2019  . Encounter for gynecological examination with Papanicolaou smear of cervix 06/14/2019  . Lymph node enlargement   . Flexural atopic dermatitis 01/31/2018  . Itching 11/01/2017  . Seasonal and perennial allergic rhinitis 11/01/2017  . History of breast cancer in female 04/21/2016  . Routine cervical smear 04/21/2016  . Vaginal atrophy  04/21/2016  . OVERWEIGHT 01/28/2010  . LEG CRAMPS, NOCTURNAL 01/28/2010  . ELECTROCARDIOGRAM, ABNORMAL 01/28/2010  . CARCINOMA IN SITU, RIGHT BREAST 11/27/2009  . HYPERLIPIDEMIA 11/27/2009  . ARM PAIN, LEFT 11/27/2009  . HYPOTHYROIDISM 11/26/2009  . Essential hypertension 11/26/2009    History obtained from: chart review and patient.  Maria Ritter is a 75 y.o. female presenting for a follow up visit.  She was last seen in June 2021.  At that time, we continue with Allegra 1 to 2 tablets at night.  We also started Pepcid 20 mg twice daily to help with itching.  We discussed initiation of Dupixent.  For her eczema, will continue with triamcinolone as needed and moisturizing twice daily.  Since the last visit, she has continued to have problems. But overall her symptoms come and go. She was supposed to see me at the end of December, but she cancelled it because she was doing well at the time.   She is having itching that is primarily at night. She has a list of things she might be triggering her symptoms. Over the past week or so, this is when she started having these problems. She has lost two good friends to moving away. This was upsetting to her. Then on 12-24-22 her dog died. Her brother-in-law died a couple of hours later. She has had a lot of stress.   She is wondering whether this might be related to foods, although she is unable to tell me about particularly triggering foods. She thinks it might be sugar or salt.  She was told that she is prediabetic. Her Hgb A1c has been a problem, around 6.1. She is on a lot of medication. The newest thing is Tylenol arthritis 650mg  daily. She started this a couple of weeks ago. She has tried hydroxyzine, which has helped somewhat. She does use Allegra and Pepcid somewhat consistently, although later in the visit she asks whether she needs to take it consistently.   She has something on her left leg called "lichen simplex". This remains stable over time.  She does report that it gets better when she uses triamcinolone regularly.   Review of labs from her primary care provider obtained late last month show an absolute eosinophil count of 300.  CMP was unremarkable aside from a hemoglobin A1c of 6.1.  Otherwise, there have been no changes to her past medical history, surgical history, family history, or social history.    Review of Systems  Constitutional: Negative.  Negative for chills, fever, malaise/fatigue and weight loss.  HENT: Positive for congestion and sinus pain. Negative for ear discharge and ear pain.   Eyes: Negative for pain, discharge and redness.  Respiratory: Negative for cough, sputum production, shortness of breath and wheezing.   Cardiovascular: Negative.  Negative for chest pain and palpitations.  Gastrointestinal: Negative for abdominal pain, constipation, diarrhea, heartburn, nausea and vomiting.  Skin: Negative.  Negative for itching and rash.  Neurological: Negative for dizziness and headaches.  Endo/Heme/Allergies: Positive for environmental allergies. Does not bruise/bleed easily.       Objective:   Blood pressure 122/62, pulse 80, temperature (!) 97.5 F (36.4 C), temperature source Temporal, resp. rate 18, height 5' 4.17" (1.63 m), weight 177 lb 12.8 oz (80.6 kg), SpO2 99 %. Body mass index is 30.35 kg/m.   Physical Exam:  Physical Exam Constitutional:      Appearance: She is well-developed.     Comments: Very talkative.   HENT:     Head: Normocephalic and atraumatic.     Right Ear: Tympanic membrane, ear canal and external ear normal.     Left Ear: Tympanic membrane, ear canal and external ear normal.     Nose: No nasal deformity, septal deviation, mucosal edema, rhinorrhea or epistaxis.     Right Turbinates: Enlarged and swollen.     Left Turbinates: Enlarged and swollen.     Right Sinus: No maxillary sinus tenderness or frontal sinus tenderness.     Left Sinus: No maxillary sinus tenderness or  frontal sinus tenderness.     Mouth/Throat:     Mouth: Oropharynx is clear and moist. Mucous membranes are not pale and not dry.     Pharynx: Uvula midline.  Eyes:     General:        Right eye: No discharge.        Left eye: No discharge.     Extraocular Movements: EOM normal.     Conjunctiva/sclera: Conjunctivae normal.     Right eye: Right conjunctiva is not injected. No chemosis.    Left eye: Left conjunctiva is not injected. No chemosis.    Pupils: Pupils are equal, round, and reactive to light.  Cardiovascular:     Rate and Rhythm: Normal rate and regular rhythm.     Heart sounds: Normal heart sounds.  Pulmonary:     Effort: Pulmonary effort is normal. No tachypnea, accessory muscle usage or respiratory distress.     Breath sounds: Normal breath sounds. No wheezing, rhonchi or rales.  Chest:     Chest wall:  No tenderness.  Lymphadenopathy:     Cervical: No cervical adenopathy.  Skin:    Coloration: Skin is not pale.     Findings: No abrasion, erythema, petechiae or rash. Rash is not papular, urticarial or vesicular.     Comments: There is a flat erythematous macules on her left thigh. She also has thickened patches on bilateral thighs (which she calls lichen simplex). She has a number of papular lesions on her bilateral legs with scabbing and excoriations.  Neurological:     Mental Status: She is alert.  Psychiatric:        Mood and Affect: Mood and affect normal.        Behavior: Behavior is cooperative.      Diagnostic studies: labs sent instead      Salvatore Marvel, MD  [facility] [place] [facility] [place]

## 2020-09-24 ENCOUNTER — Ambulatory Visit: Payer: Medicare HMO | Admitting: Cardiology

## 2020-09-24 ENCOUNTER — Encounter: Payer: Self-pay | Admitting: Cardiology

## 2020-09-24 ENCOUNTER — Other Ambulatory Visit: Payer: Self-pay

## 2020-09-24 VITALS — BP 136/78 | HR 75 | Ht 65.0 in | Wt 175.0 lb

## 2020-09-24 DIAGNOSIS — E782 Mixed hyperlipidemia: Secondary | ICD-10-CM | POA: Diagnosis not present

## 2020-09-24 DIAGNOSIS — I6523 Occlusion and stenosis of bilateral carotid arteries: Secondary | ICD-10-CM | POA: Diagnosis not present

## 2020-09-24 DIAGNOSIS — R9439 Abnormal result of other cardiovascular function study: Secondary | ICD-10-CM

## 2020-09-24 DIAGNOSIS — I10 Essential (primary) hypertension: Secondary | ICD-10-CM

## 2020-09-24 NOTE — Progress Notes (Signed)
Cardiology Office Note  Date: 09/24/2020   ID: Maria, Ritter 05/26/1946, MRN [id number]  PCP:  Celene Squibb, MD  Cardiologist:  Rozann Lesches, MD Electrophysiologist:  None   Chief Complaint  Patient presents with  . Cardiac follow-up    History of Present Illness: Maria Ritter is a 75 y.o. female last seen in December 2020.  She presents for a follow-up visit.  Overall doing reasonably well, does not report any exertional chest pain, still participating in maintenance cardiac rehabilitation for exercise.  I reviewed her medications which are outlined below.  She is on Crestor 10 mg daily at this point, recent follow-up lab work done by Dr. Nevada Crane showed good LDL control of 64.  Last carotid Dopplers were in December 2019 indicating mild to moderate atherosclerosis.  She is due for a follow-up study.  I personally reviewed her ECG today which shows sinus rhythm.  Past Medical History:  Diagnosis Date  . Breast cancer ([name]) 2010   Right Lumpectomy and XRT  . Carotid artery disease (HCC)    Mild to moderate bilateral ICA disease, left greater than right 07/2015  . Essential hypertension   . GERD (gastroesophageal reflux disease)   . Glaucoma   . Hyperlipidemia   . Hypothyroidism   . Osteoarthritis   . Polymyalgia rheumatica (HCC)     Past Surgical History:  Procedure Laterality Date  . AXILLARY LYMPH NODE BIOPSY Right 05/21/2019   Procedure: RIGHT AXILLARY LYMPH NODE BIOPSY;  Surgeon: Aviva Signs, MD;  Location: [facility];  Service: General;  Laterality: Right;  . BASAL CELL CARCINOMA EXCISION  2010   Right breast  . BREAST LUMPECTOMY Right 2010  . ENDOMETRIAL ABLATION  2002  . FINGER SURGERY     Minor surgical intervention following a crush injury to the left fourth finger  . TOE SURGERY Right 07/30/2020   2nd toe on right foot  . TOOTH EXTRACTION  01/2019    Current Outpatient Medications  Medication Sig Dispense Refill  . amLODipine (NORVASC) 5 MG  tablet Take 5 mg by mouth daily.    Marland Kitchen CALCIUM CITRATE-VITAMIN D PO Take 1 tablet by mouth daily.     . Cholecalciferol (VITAMIN D3 PO) Take 2,000 Units by mouth 2 (two) times daily.    Noelle Penner ALLERGY RELIEF 180 MG tablet TAKE 1 TABLET BY MOUTH ONCE DAILY AS NEEDED FOR  ALLERGIES  OR  RHINITIS 30 tablet 0  . famotidine (PEPCID) 20 MG tablet Take 1 tablet (20 mg total) by mouth 2 (two) times daily as needed for heartburn or indigestion. 60 tablet 5  . GLUCOSAMINE-CHONDROITIN PO Take 1 tablet by mouth 2 (two) times daily.     . hydrOXYzine (VISTARIL) 25 MG capsule Take 25 mg by mouth every 6 (six) hours as needed.    Marland Kitchen levothyroxine (SYNTHROID) 150 MCG tablet Take 150 mcg by mouth daily.    Marland Kitchen lisinopril (PRINIVIL,ZESTRIL) 10 MG tablet Take 10 mg by mouth daily.    . Magnesium 250 MG TABS Take 250 mg by mouth daily.     . methocarbamol (ROBAXIN) 500 MG tablet Take 500 mg by mouth 3 (three) times daily as needed for muscle spasms.     . Omega-3 1000 MG CAPS Take 1,000 mg by mouth 2 (two) times daily.    . rosuvastatin (CRESTOR) 10 MG tablet Take 10 mg by mouth daily.    . silver sulfADIAZINE (SILVADENE) 1 % cream Apply 1 application topically daily.  25 g 0  . sodium chloride (OCEAN) 0.65 % SOLN nasal spray Place 1 spray into both nostrils as needed for congestion.    . triamcinolone ointment (KENALOG) 0.1 % Apply 1 application topically 2 (two) times daily. 454 g 1  . Turmeric 500 MG CAPS Take 500 mg by mouth daily.    . vitamin B-12 (CYANOCOBALAMIN) 1000 MCG tablet Take 500 mcg by mouth daily.     No current facility-administered medications for this visit.   Allergies:  Mango flavor, Erythromycin, Ivp dye [iodinated diagnostic agents], Penicillins, Sulfonamide derivatives, Neosporin [bacitracin-polymyxin b], and Tape   ROS: No palpitations or syncope.  Physical Exam: VS:  BP 136/78   Pulse 75   Ht 5\' 5"  (1.651 m)   Wt 175 lb (79.4 kg)   SpO2 93%   BMI 29.12 kg/m , BMI Body mass index is  29.12 kg/m.  Wt Readings from Last 3 Encounters:  09/24/20 175 lb (79.4 kg)  09/10/20 177 lb 12.8 oz (80.6 kg)  05/02/20 186 lb 4.6 oz (84.5 kg)    General: Patient appears comfortable at rest. HEENT: Conjunctiva and lids normal, wearing a mask. Neck: Supple, no elevated JVP or carotid bruits, no thyromegaly. Lungs: Clear to auscultation, nonlabored breathing at rest. Cardiac: Regular rate and rhythm, no S3 or significant systolic murmur, no pericardial rub. Extremities: No pitting edema.  ECG:  An ECG dated 05/17/2019 was personally reviewed today and demonstrated:  Sinus rhythm with PACs.  Recent Labwork:  No recent lab work for review today.  Other Studies Reviewed Today:  Carotid Dopplers 08/21/2018: IMPRESSION: 1. Mild (1-49%) stenosis proximal right internal carotid artery secondary to heterogenous atherosclerotic plaque. 2. Mild (1-49%) stenosis proximal left internal carotid artery secondary to heterogenous atherosclerotic plaque. 3. No significant interval progression of internal carotid artery disease compared to 08/18/2015. 4. Vertebral arteries are patent with normal antegrade flow.  Exercise Cardiolite 10/23/2015:  Horizontal ST segment depression ST segment depression was noted during stress in the II leads.  Defect 1: There is a medium defect of mild severity present in the mid inferolateral location. It is non-reversible and while it is most likely due to soft tissue attenuation artifact, scar cannot entirely be excluded given the corresponding ECG abnormalities.  This is a low risk study.  Nuclear stress EF: 63%.  Assessment and Plan:  1.  Mild to moderate carotid artery disease, asymptomatic.  She continues on aspirin and is also on Crestor with good LDL control.  Follow-up carotid Dopplers will be obtained.  2.  Mixed hyperlipidemia, recent LDL 64 on Crestor.  3.  Essential hypertension, systolic blood pressure 737T on Norvasc and lisinopril.  4.   History of abnormal Myoview without active angina symptoms on medical therapy.  Continue aspirin, Norvasc, lisinopril, and Crestor.  ECG is normal today.  Medication Adjustments/Labs and Tests Ordered: Current medicines are reviewed at length with the patient today.  Concerns regarding medicines are outlined above.   Tests Ordered: Orders Placed This Encounter  Procedures  . US Carotid Duplex Bilateral  . EKG 12-Lead    Medication Changes: No orders of the defined types were placed in this encounter.   Disposition:  Follow up 1 year in the [place] office.  Signed, Satira Sark, MD, [facility] 09/24/2020 12:53 PM    [facility] at [facility] [address], [place], [place] [address] Phone: [phone]; Fax: [phone]

## 2020-09-24 NOTE — Patient Instructions (Signed)
Medication Instructions:  °Your physician recommends that you continue on your current medications as directed. Please refer to the Current Medication list given to you today. ° °*If you need a refill on your cardiac medications before your next appointment, please call your pharmacy* ° ° °Lab Work: °NONE  ° °If you have labs (blood work) drawn today and your tests are completely normal, you will receive your results only by: °MyChart Message (if you have MyChart) OR °A paper copy in the mail °If you have any lab test that is abnormal or we need to change your treatment, we will call you to review the results. ° ° °Testing/Procedures: °Your physician has requested that you have a carotid duplex. This test is an ultrasound of the carotid arteries in your neck. It looks at blood flow through these arteries that supply the brain with blood. Allow one hour for this exam. There are no restrictions or special instructions. ° ° ° °Follow-Up: °At [facility], you and your health needs are our priority.  As part of our continuing mission to provide you with exceptional heart care, we have created designated Provider Care Teams.  These Care Teams include your primary Cardiologist (physician) and Advanced Practice Providers (APPs -  Physician Assistants and Nurse Practitioners) who all work together to provide you with the care you need, when you need it. ° °We recommend signing up for the patient portal called "MyChart".  Sign up information is provided on this After Visit Summary.  MyChart is used to connect with patients for Virtual Visits (Telemedicine).  Patients are able to view lab/test results, encounter notes, upcoming appointments, etc.  Non-urgent messages can be sent to your provider as well.   °To learn more about what you can do with MyChart, go to [email or web address].   ° °Your next appointment:   °1 year(s) ° °The format for your next appointment:   °In Person ° °Provider:   °Samuel McDowell, MD   ° ° °Other Instructions °Thank you for choosing [facility]! ° ° ° °

## 2020-09-25 DIAGNOSIS — L600 Ingrowing nail: Secondary | ICD-10-CM | POA: Diagnosis not present

## 2020-09-25 DIAGNOSIS — M79674 Pain in right toe(s): Secondary | ICD-10-CM | POA: Diagnosis not present

## 2020-09-25 DIAGNOSIS — M79671 Pain in right foot: Secondary | ICD-10-CM | POA: Diagnosis not present

## 2020-10-01 ENCOUNTER — Ambulatory Visit: Payer: Medicare HMO | Admitting: Allergy & Immunology

## 2020-10-06 ENCOUNTER — Ambulatory Visit (HOSPITAL_COMMUNITY): Payer: Medicare HMO

## 2020-10-07 DIAGNOSIS — I739 Peripheral vascular disease, unspecified: Secondary | ICD-10-CM | POA: Diagnosis not present

## 2020-10-10 ENCOUNTER — Ambulatory Visit (HOSPITAL_COMMUNITY)
Admission: RE | Admit: 2020-10-10 | Discharge: 2020-10-10 | Disposition: A | Discharge status: Home / Self Care | Payer: Medicare HMO | Source: Ambulatory Visit | Attending: Cardiology | Admitting: Cardiology

## 2020-10-10 ENCOUNTER — Other Ambulatory Visit: Payer: Self-pay

## 2020-10-10 DIAGNOSIS — I6523 Occlusion and stenosis of bilateral carotid arteries: Secondary | ICD-10-CM | POA: Diagnosis not present

## 2020-10-17 ENCOUNTER — Encounter: Payer: Self-pay | Admitting: Allergy & Immunology

## 2020-10-17 ENCOUNTER — Ambulatory Visit: Payer: Medicare HMO | Admitting: Allergy & Immunology

## 2020-10-17 ENCOUNTER — Other Ambulatory Visit: Payer: Self-pay

## 2020-10-17 VITALS — BP 102/70 | HR 69 | Resp 18 | Ht 65.0 in | Wt 175.0 lb

## 2020-10-17 DIAGNOSIS — L299 Pruritus, unspecified: Secondary | ICD-10-CM | POA: Diagnosis not present

## 2020-10-17 DIAGNOSIS — L2089 Other atopic dermatitis: Secondary | ICD-10-CM

## 2020-10-17 MED ORDER — FEXOFENADINE HCL 180 MG PO TABS: 180.0000 mg | ORAL_TABLET | Freq: Every day | ORAL | 2 refills | Status: DC

## 2020-10-17 MED ORDER — METHYLPREDNISOLONE ACETATE 80 MG/ML IJ SUSP
80.0000 mg | Freq: Once | INTRAMUSCULAR | Status: AC
  Administered 2020-10-17: 80 mg via INTRAMUSCULAR

## 2020-10-17 MED ORDER — LEVOTHYROXINE SODIUM 150 MCG PO TABS: 150.0000 ug | ORAL_TABLET | Freq: Every day | ORAL | 0 refills | Status: DC

## 2020-10-17 NOTE — Progress Notes (Signed)
FOLLOW UP  Date of Service/Encounter:  10/17/20   Assessment:   Itching-with some clear environmental allergic triggers   Eczema - on triamcinolone ointment BID PRN(once again interested inDupixenttoday)  Perennialand seasonalallergicrhinitis(grasses, indoor molds and dust mites)  Elevated absolute eosinophil count of 700 (April 2019)   Plan/Recommendations:   1. Itching with sensitizations to grasses, indoor molds, dust mites - Continue with Allegra (fexofenadine) 180mg  + Pepcid 20mg  twice daily daily. - You can use the hydroxyzine every 6-8 hours on top of that.  - We are sending in brand name Synthroid to see if   2. Eczema - Continue with moisturizing twice daily as as you are doing. - Continue with triamcinolone 0.1% ointment mixed 1:1 with Eucerin. - The Dupixent would help with your eczema.    she will need to supply her income such as tax form or social security benefit letter that states what she gets paid monthly - Maria Ritter will reach out to you again to discuss costs and whatnot [phone]) - You will to supply her income such as tax form or social security benefit letter that states what she gets paid monthly.   - Form provided to fill out for coverage of the drug.  - Labs ordered (go to [place]).   3. Return in about 6 months (around 04/16/2021).   Subjective:   Maria Ritter is a 75 y.o. female presenting today for follow up of  Chief Complaint  Patient presents with  . Pruritis    Back itiching   . Eczema    Maria Ritter has a history of the following: Patient Active Problem List   Diagnosis Date Noted  . Abnormal stress test 08/02/2019  . Postinflammatory skin changes 06/14/2019  . Encounter for colorectal cancer screening 06/14/2019  . Encounter for gynecological examination with Papanicolaou smear of cervix 06/14/2019  . Lymph node enlargement   . Flexural atopic dermatitis 01/31/2018  . Itching 11/01/2017  . Seasonal and  perennial allergic rhinitis 11/01/2017  . History of breast cancer in female 04/21/2016  . Routine cervical smear 04/21/2016  . Vaginal atrophy 04/21/2016  . OVERWEIGHT 01/28/2010  . LEG CRAMPS, NOCTURNAL 01/28/2010  . ELECTROCARDIOGRAM, ABNORMAL 01/28/2010  . CARCINOMA IN SITU, RIGHT BREAST 11/27/2009  . HYPERLIPIDEMIA 11/27/2009  . ARM PAIN, LEFT 11/27/2009  . HYPOTHYROIDISM 11/26/2009  . Essential hypertension 11/26/2009    History obtained from: chart review and patient.  Maria Ritter is a 75 y.o. female presenting for a follow up visit.  She was last seen in January 2022.  At that time, she was having worsening itching.  Eczema was not under good control.  We continue with moisturizing twice daily.  We also stopped her triamcinolone and instead started triamcinolone compounded with Eucerin twice daily.  We recommended mixing up her self safe money.  We also discussed Dupixent, which we had discussed on a number of occasions.  She was interested in it.  We did give her Depo-Medrol.  We also ordered some more labs.  She has a previous sensitization to grasses, indoor mold, and dust mites.  We continue with Allegra 1 to 2 tablets at night as well as Pepcid 1-2 times daily.  Since the last visit, she went to get the triamcinolone. She also got the Eucerin. She used this over her entire body and it worked well.  However, it does not last long.  She remains interested in Maria Ritter.  She says she never heard from Maria Ritter, or she never answered.  I did give her Maria Ritter's number today she did not think that numbers have been familiar.  Regardless, I also talked to Maria Ritter and she faxed the requisite forms to get company assistance.  Maria Ritter was very relieved that she might be able to receive the drug for free.  I think that might be an issue that was holding her back.  She has been on stronger steroids in the past, at least 3.  I believe she has been on Elidel as well.  BUT her back is very inflamed now. She  has issues at night and is up for two hours with itching and scratching. She got a new bed. She tells me that she thought it might be part of the problem. But there might have been dust mites. So the bed should not be a problem.  She is using the Allegra and the Pepcid twice daily each.  She also has hydroxyzine and is unsure when she should use that in relationship to the Allegra and Pepcid.  She did report that the Depo-Medrol helped at the last visit.  She is wondering if she can get something now to help.  She never did get the blood test done at the last visit.  Otherwise, there have been no changes to her past medical history, surgical history, family history, or social history.    Review of Systems  Constitutional: Negative.  Negative for chills, fever, malaise/fatigue and weight loss.  HENT: Negative.  Negative for congestion, ear discharge, ear pain, sinus pain and sore throat.   Eyes: Negative for pain, discharge and redness.  Respiratory: Negative for cough, sputum production, shortness of breath and wheezing.   Cardiovascular: Negative.  Negative for chest pain and palpitations.  Gastrointestinal: Negative for abdominal pain, constipation, diarrhea, heartburn, nausea and vomiting.  Skin: Positive for itching and rash.  Neurological: Negative for dizziness and headaches.  Endo/Heme/Allergies: Negative for environmental allergies. Does not bruise/bleed easily.       Objective:   Blood pressure 102/70, pulse 69, resp. rate 18, height 5\' 5"  (1.651 m), weight 175 lb (79.4 kg), SpO2 95 %. Body mass index is 29.12 kg/m.   Physical Exam:  Physical Exam Constitutional:      Appearance: She is well-developed.  HENT:     Head: Normocephalic and atraumatic.     Right Ear: Tympanic membrane, ear canal and external ear normal.     Left Ear: Tympanic membrane and ear canal normal.     Nose: No nasal deformity, septal deviation, mucosal edema, rhinorrhea or epistaxis.     Right  Sinus: No maxillary sinus tenderness or frontal sinus tenderness.     Left Sinus: No maxillary sinus tenderness or frontal sinus tenderness.     Mouth/Throat:     Mouth: Oropharynx is clear and moist. Mucous membranes are not pale and not dry.     Pharynx: Uvula midline.  Eyes:     General:        Right eye: No discharge.        Left eye: No discharge.     Extraocular Movements: EOM normal.     Conjunctiva/sclera: Conjunctivae normal.     Right eye: Right conjunctiva is not injected. No chemosis.    Left eye: Left conjunctiva is not injected. No chemosis.    Pupils: Pupils are equal, round, and reactive to light.  Cardiovascular:     Rate and Rhythm: Normal rate and regular rhythm.     Heart sounds: Normal heart sounds.  Pulmonary:     Effort: Pulmonary effort is normal. No tachypnea, accessory muscle usage or respiratory distress.     Breath sounds: Normal breath sounds. No wheezing, rhonchi or rales.  Chest:     Chest wall: No tenderness.  Lymphadenopathy:     Cervical: No cervical adenopathy.  Skin:    General: Skin is warm.     Capillary Refill: Capillary refill takes less than 2 seconds.     Coloration: Skin is not pale.     Findings: No abrasion, erythema, petechiae or rash. Rash is not papular, urticarial or vesicular.     Comments: Some excoriations on the back.  She does have some pustules with eschars.  Overall, this is mostly concentrated on her lower back.  It does not seem to be associated with her bra.  She does have some eczematous lesions on her bilateral arms.  However, her back is the main problem.  Neurological:     Mental Status: She is alert.  Psychiatric:        Mood and Affect: Mood and affect normal.      Diagnostic studies: none      Salvatore Marvel, MD  All[facility] and [place] [facility] [place]

## 2020-10-17 NOTE — Patient Instructions (Addendum)
1. Itching with sensitizations to grasses, indoor molds, dust mites - Continue with Allegra (fexofenadine) 180mg  + Pepcid 20mg  twice daily daily. - You can use the hydroxyzine every 6-8 hours on top of that.  - We are sending in brand name Synthroid to see if   2. Eczema - Continue with moisturizing twice daily as as you are doing. - Continue with triamcinolone 0.1% ointment mixed 1:1 with Eucerin. - The Dupixent would help with your eczema.    she will need to supply her income such as tax form or social security benefit letter that states what she gets paid monthly - Tammy will reach out to you again to discuss costs and whatnot ([phone]) - You will to supply her income such as tax form or social security benefit letter that states what she gets paid monthly.   - Form provided to fill out for coverage of the drug.  - Labs ordered (go to [facility]).   3. Return in about 6 months (around 04/16/2021).    Please inform us of any Emergency Department visits, hospitalizations, or changes in symptoms. Call us before going to the ED for breathing or allergy symptoms since we might be able to fit you in for a sick visit. Feel free to contact us anytime with any questions, problems, or concerns.  It was a pleasure to see you again today!  Websites that have reliable patient information: 1. American Academy of Asthma, Allergy, and Immunology: www.aaaai.org 2. Food Allergy Research and Education (FARE): foodallergy.org 3. Mothers of Asthmatics: http://www.asthmacommunitynetwork.org 4. American College of Allergy, Asthma, and Immunology: www.acaai.org   COVID-19 Vaccine Information can be found at: ShippingScam.co.uk For questions related to vaccine distribution or appointments, please email [email or web address] or call [phone].   We realize that you might be concerned about having an allergic reaction to the COVID19 vaccines.  To help with that concern, WE ARE OFFERING THE COVID19 VACCINES IN OUR OFFICE! Ask the front desk for dates!     "Like" [place] on Facebook and Instagram for our latest updates!      A healthy democracy works best when [employer] participate! Make sure you are registered to vote! If you have moved or changed any of your contact information, you will need to get this updated before voting!  In some cases, you MAY be able to register to vote online: [email or web address]

## 2020-10-20 DIAGNOSIS — L299 Pruritus, unspecified: Secondary | ICD-10-CM | POA: Diagnosis not present

## 2020-10-21 DIAGNOSIS — M25561 Pain in right knee: Secondary | ICD-10-CM | POA: Diagnosis not present

## 2020-10-21 DIAGNOSIS — E663 Overweight: Secondary | ICD-10-CM | POA: Diagnosis not present

## 2020-10-21 DIAGNOSIS — L4059 Other psoriatic arthropathy: Secondary | ICD-10-CM | POA: Diagnosis not present

## 2020-10-21 DIAGNOSIS — R220 Localized swelling, mass and lump, head: Secondary | ICD-10-CM | POA: Diagnosis not present

## 2020-10-21 DIAGNOSIS — M5441 Lumbago with sciatica, right side: Secondary | ICD-10-CM | POA: Diagnosis not present

## 2020-10-21 DIAGNOSIS — Z6829 Body mass index (BMI) 29.0-29.9, adult: Secondary | ICD-10-CM | POA: Diagnosis not present

## 2020-10-21 DIAGNOSIS — M8580 Other specified disorders of bone density and structure, unspecified site: Secondary | ICD-10-CM | POA: Diagnosis not present

## 2020-10-21 DIAGNOSIS — M25551 Pain in right hip: Secondary | ICD-10-CM | POA: Diagnosis not present

## 2020-10-21 DIAGNOSIS — M5030 Other cervical disc degeneration, unspecified cervical region: Secondary | ICD-10-CM | POA: Diagnosis not present

## 2020-10-21 DIAGNOSIS — R945 Abnormal results of liver function studies: Secondary | ICD-10-CM | POA: Diagnosis not present

## 2020-10-21 LAB — CBC WITH DIFFERENTIAL
Basophils Absolute: 0.1 10*3/uL (ref 0.0–0.2)
Basos: 1 %
EOS (ABSOLUTE): 0.3 10*3/uL (ref 0.0–0.4)
Eos: 5 %
Hematocrit: 43.6 % (ref 34.0–46.6)
Hemoglobin: 14.4 g/dL (ref 11.1–15.9)
Immature Grans (Abs): 0 10*3/uL (ref 0.0–0.1)
Immature Granulocytes: 0 %
Lymphocytes Absolute: 2 10*3/uL (ref 0.7–3.1)
Lymphs: 27 %
MCH: 28.9 pg (ref 26.6–33.0)
MCHC: 33 g/dL (ref 31.5–35.7)
MCV: 87 fL (ref 79–97)
Monocytes Absolute: 0.6 10*3/uL (ref 0.1–0.9)
Monocytes: 8 %
Neutrophils Absolute: 4.5 10*3/uL (ref 1.4–7.0)
Neutrophils: 59 %
RBC: 4.99 x10E6/uL (ref 3.77–5.28)
RDW: 13.5 % (ref 11.7–15.4)
WBC: 7.6 10*3/uL (ref 3.4–10.8)

## 2020-10-21 LAB — TRYPTASE: Tryptase: 8.2 ug/L (ref 2.2–13.2)

## 2020-10-21 LAB — C-REACTIVE PROTEIN: CRP: 1 mg/L (ref 0–10)

## 2020-10-21 LAB — SEDIMENTATION RATE: Sed Rate: 12 mm/hr (ref 0–40)

## 2020-10-21 LAB — ANA W/REFLEX IF POSITIVE: Anti Nuclear Antibody (ANA): NEGATIVE

## 2020-10-24 LAB — ALPHA-GAL PANEL
Allergen Lamb IgE: <0.1 kU/L
Beef IgE: <0.1 kU/L
IgE (Immunoglobulin E), Serum: 307 IU/mL (ref 6–495)
O215-IgE Alpha-Gal: 0.16 kU/L — AB
Pork IgE: <0.1 kU/L

## 2020-10-29 ENCOUNTER — Ambulatory Visit: Payer: Medicare HMO | Admitting: Allergy & Immunology

## 2020-10-29 DIAGNOSIS — D485 Neoplasm of uncertain behavior of skin: Secondary | ICD-10-CM | POA: Diagnosis not present

## 2020-10-29 DIAGNOSIS — C44319 Basal cell carcinoma of skin of other parts of face: Secondary | ICD-10-CM | POA: Diagnosis not present

## 2020-10-29 DIAGNOSIS — L299 Pruritus, unspecified: Secondary | ICD-10-CM | POA: Diagnosis not present

## 2020-11-03 ENCOUNTER — Telehealth: Payer: Self-pay

## 2020-11-03 NOTE — Telephone Encounter (Signed)
I will let Webb Silversmith know as well. She was going to be doing the challenge.  Salvatore Marvel, MD [facility] [place] [facility] [place]

## 2020-11-03 NOTE — Telephone Encounter (Signed)
Patient called and she would like to hold off on doing Dupixent, as she really isn't comfortable with doing the injections right now.  Patient also canceled her synthroid challange for Friday stating the medication is going to cost too much each month. I informed her we just needed the medication for the challenge, but the patient still canceled.  Just a FYI update for you both.  Thanks

## 2020-11-07 ENCOUNTER — Encounter: Payer: Medicare HMO | Admitting: Family Medicine

## 2020-11-13 DIAGNOSIS — C4401 Basal cell carcinoma of skin of lip: Secondary | ICD-10-CM | POA: Diagnosis not present

## 2020-12-11 DIAGNOSIS — Z23 Encounter for immunization: Secondary | ICD-10-CM | POA: Diagnosis not present

## 2020-12-23 DIAGNOSIS — I739 Peripheral vascular disease, unspecified: Secondary | ICD-10-CM | POA: Diagnosis not present

## 2020-12-24 DIAGNOSIS — M1991 Primary osteoarthritis, unspecified site: Secondary | ICD-10-CM | POA: Diagnosis not present

## 2020-12-24 DIAGNOSIS — E782 Mixed hyperlipidemia: Secondary | ICD-10-CM | POA: Diagnosis not present

## 2020-12-24 DIAGNOSIS — M1712 Unilateral primary osteoarthritis, left knee: Secondary | ICD-10-CM | POA: Diagnosis not present

## 2020-12-24 DIAGNOSIS — E039 Hypothyroidism, unspecified: Secondary | ICD-10-CM | POA: Diagnosis not present

## 2020-12-24 DIAGNOSIS — E559 Vitamin D deficiency, unspecified: Secondary | ICD-10-CM | POA: Diagnosis not present

## 2020-12-24 DIAGNOSIS — R7301 Impaired fasting glucose: Secondary | ICD-10-CM | POA: Diagnosis not present

## 2020-12-24 DIAGNOSIS — E7849 Other hyperlipidemia: Secondary | ICD-10-CM | POA: Diagnosis not present

## 2020-12-24 DIAGNOSIS — M1711 Unilateral primary osteoarthritis, right knee: Secondary | ICD-10-CM | POA: Diagnosis not present

## 2020-12-24 DIAGNOSIS — I10 Essential (primary) hypertension: Secondary | ICD-10-CM | POA: Diagnosis not present

## 2020-12-24 DIAGNOSIS — M170 Bilateral primary osteoarthritis of knee: Secondary | ICD-10-CM | POA: Diagnosis not present

## 2020-12-28 DIAGNOSIS — J309 Allergic rhinitis, unspecified: Secondary | ICD-10-CM | POA: Diagnosis not present

## 2020-12-28 DIAGNOSIS — I10 Essential (primary) hypertension: Secondary | ICD-10-CM | POA: Diagnosis not present

## 2020-12-28 DIAGNOSIS — R945 Abnormal results of liver function studies: Secondary | ICD-10-CM | POA: Diagnosis not present

## 2020-12-28 DIAGNOSIS — E039 Hypothyroidism, unspecified: Secondary | ICD-10-CM | POA: Diagnosis not present

## 2020-12-28 DIAGNOSIS — Z853 Personal history of malignant neoplasm of breast: Secondary | ICD-10-CM | POA: Diagnosis not present

## 2020-12-28 DIAGNOSIS — K219 Gastro-esophageal reflux disease without esophagitis: Secondary | ICD-10-CM | POA: Diagnosis not present

## 2020-12-28 DIAGNOSIS — M170 Bilateral primary osteoarthritis of knee: Secondary | ICD-10-CM | POA: Diagnosis not present

## 2020-12-28 DIAGNOSIS — R7301 Impaired fasting glucose: Secondary | ICD-10-CM | POA: Diagnosis not present

## 2020-12-28 DIAGNOSIS — L309 Dermatitis, unspecified: Secondary | ICD-10-CM | POA: Diagnosis not present

## 2020-12-28 DIAGNOSIS — I2510 Atherosclerotic heart disease of native coronary artery without angina pectoris: Secondary | ICD-10-CM | POA: Diagnosis not present

## 2020-12-28 DIAGNOSIS — E782 Mixed hyperlipidemia: Secondary | ICD-10-CM | POA: Diagnosis not present

## 2020-12-29 DIAGNOSIS — Z853 Personal history of malignant neoplasm of breast: Secondary | ICD-10-CM | POA: Diagnosis not present

## 2020-12-29 DIAGNOSIS — R7301 Impaired fasting glucose: Secondary | ICD-10-CM | POA: Diagnosis not present

## 2020-12-29 DIAGNOSIS — I10 Essential (primary) hypertension: Secondary | ICD-10-CM | POA: Diagnosis not present

## 2020-12-29 DIAGNOSIS — M170 Bilateral primary osteoarthritis of knee: Secondary | ICD-10-CM | POA: Diagnosis not present

## 2020-12-29 DIAGNOSIS — E782 Mixed hyperlipidemia: Secondary | ICD-10-CM | POA: Diagnosis not present

## 2020-12-29 DIAGNOSIS — I2510 Atherosclerotic heart disease of native coronary artery without angina pectoris: Secondary | ICD-10-CM | POA: Diagnosis not present

## 2020-12-29 DIAGNOSIS — R945 Abnormal results of liver function studies: Secondary | ICD-10-CM | POA: Diagnosis not present

## 2020-12-29 DIAGNOSIS — J309 Allergic rhinitis, unspecified: Secondary | ICD-10-CM | POA: Diagnosis not present

## 2020-12-29 DIAGNOSIS — L309 Dermatitis, unspecified: Secondary | ICD-10-CM | POA: Diagnosis not present

## 2020-12-29 DIAGNOSIS — E039 Hypothyroidism, unspecified: Secondary | ICD-10-CM | POA: Diagnosis not present

## 2021-01-19 DIAGNOSIS — L570 Actinic keratosis: Secondary | ICD-10-CM | POA: Diagnosis not present

## 2021-01-19 DIAGNOSIS — L218 Other seborrheic dermatitis: Secondary | ICD-10-CM | POA: Diagnosis not present

## 2021-01-19 DIAGNOSIS — L821 Other seborrheic keratosis: Secondary | ICD-10-CM | POA: Diagnosis not present

## 2021-01-19 DIAGNOSIS — Z85828 Personal history of other malignant neoplasm of skin: Secondary | ICD-10-CM | POA: Diagnosis not present

## 2021-02-04 DIAGNOSIS — H40013 Open angle with borderline findings, low risk, bilateral: Secondary | ICD-10-CM | POA: Diagnosis not present

## 2021-02-04 DIAGNOSIS — I10 Essential (primary) hypertension: Secondary | ICD-10-CM | POA: Diagnosis not present

## 2021-02-04 DIAGNOSIS — H43811 Vitreous degeneration, right eye: Secondary | ICD-10-CM | POA: Diagnosis not present

## 2021-02-04 DIAGNOSIS — R600 Localized edema: Secondary | ICD-10-CM | POA: Diagnosis not present

## 2021-02-04 DIAGNOSIS — J302 Other seasonal allergic rhinitis: Secondary | ICD-10-CM | POA: Diagnosis not present

## 2021-02-04 DIAGNOSIS — Z961 Presence of intraocular lens: Secondary | ICD-10-CM | POA: Diagnosis not present

## 2021-02-06 ENCOUNTER — Ambulatory Visit: Payer: Medicare HMO | Admitting: Allergy & Immunology

## 2021-02-10 DIAGNOSIS — I739 Peripheral vascular disease, unspecified: Secondary | ICD-10-CM | POA: Diagnosis not present

## 2021-02-10 DIAGNOSIS — I10 Essential (primary) hypertension: Secondary | ICD-10-CM | POA: Diagnosis not present

## 2021-02-10 DIAGNOSIS — R600 Localized edema: Secondary | ICD-10-CM | POA: Diagnosis not present

## 2021-02-20 DIAGNOSIS — K219 Gastro-esophageal reflux disease without esophagitis: Secondary | ICD-10-CM | POA: Diagnosis not present

## 2021-02-20 DIAGNOSIS — I10 Essential (primary) hypertension: Secondary | ICD-10-CM | POA: Diagnosis not present

## 2021-02-25 DIAGNOSIS — Z6830 Body mass index (BMI) 30.0-30.9, adult: Secondary | ICD-10-CM | POA: Diagnosis not present

## 2021-02-25 DIAGNOSIS — M5441 Lumbago with sciatica, right side: Secondary | ICD-10-CM | POA: Diagnosis not present

## 2021-02-25 DIAGNOSIS — M5030 Other cervical disc degeneration, unspecified cervical region: Secondary | ICD-10-CM | POA: Diagnosis not present

## 2021-02-25 DIAGNOSIS — M25551 Pain in right hip: Secondary | ICD-10-CM | POA: Diagnosis not present

## 2021-02-25 DIAGNOSIS — R945 Abnormal results of liver function studies: Secondary | ICD-10-CM | POA: Diagnosis not present

## 2021-02-25 DIAGNOSIS — R220 Localized swelling, mass and lump, head: Secondary | ICD-10-CM | POA: Diagnosis not present

## 2021-02-25 DIAGNOSIS — M8580 Other specified disorders of bone density and structure, unspecified site: Secondary | ICD-10-CM | POA: Diagnosis not present

## 2021-02-25 DIAGNOSIS — M25561 Pain in right knee: Secondary | ICD-10-CM | POA: Diagnosis not present

## 2021-02-25 DIAGNOSIS — L4059 Other psoriatic arthropathy: Secondary | ICD-10-CM | POA: Diagnosis not present

## 2021-02-25 DIAGNOSIS — E669 Obesity, unspecified: Secondary | ICD-10-CM | POA: Diagnosis not present

## 2021-03-10 DIAGNOSIS — I739 Peripheral vascular disease, unspecified: Secondary | ICD-10-CM | POA: Diagnosis not present

## 2021-03-11 DIAGNOSIS — I809 Phlebitis and thrombophlebitis of unspecified site: Secondary | ICD-10-CM | POA: Diagnosis not present

## 2021-03-18 DIAGNOSIS — L814 Other melanin hyperpigmentation: Secondary | ICD-10-CM | POA: Diagnosis not present

## 2021-03-18 DIAGNOSIS — L570 Actinic keratosis: Secondary | ICD-10-CM | POA: Diagnosis not present

## 2021-03-18 DIAGNOSIS — L821 Other seborrheic keratosis: Secondary | ICD-10-CM | POA: Diagnosis not present

## 2021-03-19 DIAGNOSIS — I739 Peripheral vascular disease, unspecified: Secondary | ICD-10-CM | POA: Diagnosis not present

## 2021-03-19 DIAGNOSIS — K219 Gastro-esophageal reflux disease without esophagitis: Secondary | ICD-10-CM | POA: Diagnosis not present

## 2021-03-19 DIAGNOSIS — I809 Phlebitis and thrombophlebitis of unspecified site: Secondary | ICD-10-CM | POA: Diagnosis not present

## 2021-04-01 ENCOUNTER — Other Ambulatory Visit (HOSPITAL_COMMUNITY): Payer: Self-pay | Admitting: Vascular Surgery

## 2021-04-01 ENCOUNTER — Other Ambulatory Visit: Payer: Self-pay

## 2021-04-01 ENCOUNTER — Ambulatory Visit: Payer: Medicare HMO | Admitting: Vascular Surgery

## 2021-04-01 ENCOUNTER — Encounter: Payer: Self-pay | Admitting: Vascular Surgery

## 2021-04-01 ENCOUNTER — Ambulatory Visit (INDEPENDENT_AMBULATORY_CARE_PROVIDER_SITE_OTHER): Payer: Medicare HMO

## 2021-04-01 VITALS — BP 167/82 | HR 59 | Temp 97.0°F | Ht 61.0 in | Wt 184.4 lb

## 2021-04-01 DIAGNOSIS — I739 Peripheral vascular disease, unspecified: Secondary | ICD-10-CM | POA: Diagnosis not present

## 2021-04-01 DIAGNOSIS — I87303 Chronic venous hypertension (idiopathic) without complications of bilateral lower extremity: Secondary | ICD-10-CM

## 2021-04-01 NOTE — Progress Notes (Signed)
[facility] [place]  Patient name: Maria Ritter MRN: [id number] DOB: 1946/04/06 Sex: female  REASON FOR CONSULT: Evaluation lower extremity swelling and discoloration  HPI: Maria Ritter is a 75 y.o. female, who is here today for evaluation of her lower extremity swelling and discoloration.  She reports that in May of this year she was weed eating and had an episode of swelling and inflammation since then.  She was placed on Lasix and potassium supplements.  She has had some resolution of swelling but continues to have erythema and thickening of her skin bilaterally from her mid calf down to her ankle.  She has no history of DVT.  No history of lower extremity arterial insufficiency.  She does have known mild to moderate carotid stenosis which is asymptomatic.  She does not have any claudication type symptoms.  She does report a episode of "phlebitis" when she was 75 years old.  Past Medical History:  Diagnosis Date   Breast cancer ([facility]) 2010   Right Lumpectomy and XRT   Carotid artery disease ([facility])    Mild to moderate bilateral ICA disease, left greater than right 07/2015   Essential hypertension    GERD (gastroesophageal reflux disease)    Glaucoma    Hyperlipidemia    Hypothyroidism    Osteoarthritis    Polymyalgia rheumatica (HCC)     Family History  Problem Relation Age of Onset   Pneumonia Mother    Parkinsonism Mother    Heart failure Father     SOCIAL HISTORY: Social History   Socioeconomic History   Marital status: Widowed    Spouse name: Not on file   Number of children: Not on file   Years of education: Not on file   Highest education level: Not on file  Occupational History   Occupation: Retired from [employer]    Employer: RETIRED  Tobacco Use   Smoking status: Never   Smokeless tobacco: Never  Vaping Use   Vaping Use: Never used  Substance and Sexual Activity   Alcohol use: No     Alcohol/week: 0.0 standard drinks   Drug use: No   Sexual activity: Not on file    Comment: ablation  Other Topics Concern   Not on file  Social History Narrative   Widowed with no children   Social Determinants of Health   Financial Resource Strain: Not on file  Food Insecurity: Not on file  Transportation Needs: Not on file  Physical Activity: Not on file  Stress: Not on file  Social Connections: Not on file  Intimate Partner Violence: Not on file    Allergies  Allergen Reactions   Mango Flavor Hives   Diclofenac Sodium Other (See Comments)    Too strong, bloating, didn't agree with stomach   Erythromycin Nausea Only   Ivp Dye [Iodinated Diagnostic Agents]    Penicillins Itching    Did it involve swelling of the face/tongue/throat, SOB, or low BP? No Did it involve sudden or severe rash/hives, skin peeling, or any reaction on the inside of your mouth or nose? No Did you need to seek medical attention at a hospital or doctor's office? No When did it last happen?   10 years ago   If all above answers are "NO", may proceed with cephalosporin use.    Sulfonamide Derivatives Itching   Neosporin [Bacitracin-Polymyxin B] Itching   Tape Hives and Rash    Tape or gel    Current Outpatient  Medications  Medication Sig Dispense Refill   amLODipine (NORVASC) 5 MG tablet Take 5 mg by mouth daily.     CALCIUM CITRATE-VITAMIN D PO Take 1 tablet by mouth daily.      Cholecalciferol (VITAMIN D3 PO) Take 2,000 Units by mouth 2 (two) times daily.     famotidine (PEPCID) 20 MG tablet Take 1 tablet (20 mg total) by mouth 2 (two) times daily as needed for heartburn or indigestion. 60 tablet 5   fexofenadine (ALLEGRA) 180 MG tablet Take 1 tablet (180 mg total) by mouth daily. 90 tablet 2   fluticasone (FLONASE) 50 MCG/ACT nasal spray Place into both nostrils daily.     furosemide (LASIX) 40 MG tablet Take 40 mg by mouth.     GLUCOSAMINE-CHONDROITIN PO Take 1 tablet by mouth 2 (two) times  daily.      hydrOXYzine (VISTARIL) 25 MG capsule Take 25 mg by mouth every 6 (six) hours as needed.     ketoconazole (NIZORAL) 2 % cream Apply 1 application topically daily.     levothyroxine (SYNTHROID) 150 MCG tablet Take 150 mcg by mouth daily.     lisinopril (PRINIVIL,ZESTRIL) 10 MG tablet Take 10 mg by mouth daily.     Magnesium 250 MG TABS Take 250 mg by mouth daily.      meloxicam (MOBIC) 7.5 MG tablet Take 7.5 mg by mouth daily.     methocarbamol (ROBAXIN) 500 MG tablet Take 500 mg by mouth 3 (three) times daily as needed for muscle spasms.      metroNIDAZOLE (METROGEL) 0.75 % gel Apply 1 application topically 2 (two) times daily.     montelukast (SINGULAIR) 10 MG tablet Take 10 mg by mouth at bedtime.     Omega-3 1000 MG CAPS Take 1,000 mg by mouth 2 (two) times daily.     rosuvastatin (CRESTOR) 10 MG tablet Take 10 mg by mouth daily.     silver sulfADIAZINE (SILVADENE) 1 % cream Apply 1 application topically daily. 25 g 0   sodium chloride (OCEAN) 0.65 % SOLN nasal spray Place 1 spray into both nostrils as needed for congestion.     triamcinolone ointment (KENALOG) 0.1 % Apply 1 application topically 2 (two) times daily. 454 g 1   Turmeric 500 MG CAPS Take 500 mg by mouth daily.     vitamin B-12 (CYANOCOBALAMIN) 1000 MCG tablet Take 500 mcg by mouth daily.     zinc gluconate 50 MG tablet Take 50 mg by mouth daily.     levothyroxine (SYNTHROID) 150 MCG tablet Take 1 tablet (150 mcg total) by mouth daily before breakfast. 30 tablet 0   No current facility-administered medications for this visit.    REVIEW OF SYSTEMS:  '[X]'$  denotes positive finding, '[ ]'$  denotes negative finding Cardiac  Comments:  Chest pain or chest pressure:    Shortness of breath upon exertion:    Short of breath when lying flat:    Irregular heart rhythm:        Vascular    Pain in calf, thigh, or hip brought on by ambulation:    Pain in feet at night that wakes you up from your sleep:     Blood clot in  your veins:    Leg swelling:  x       Pulmonary    Oxygen at home:    Productive cough:     Wheezing:         Neurologic    Sudden weakness in arms  or legs:     Sudden numbness in arms or legs:     Sudden onset of difficulty speaking or slurred speech:    Temporary loss of vision in one eye:     Problems with dizziness:         Gastrointestinal    Blood in stool:     Vomited blood:         Genitourinary    Burning when urinating:     Blood in urine:        Psychiatric    Major depression:         Hematologic    Bleeding problems:    Problems with blood clotting too easily:        Skin    Rashes or ulcers:        Constitutional    Fever or chills:      PHYSICAL EXAM: Vitals:   04/01/21 1333  BP: (!) 167/82  Pulse: (!) 59  Temp: (!) 97 F (36.1 C)  TempSrc: Temporal  SpO2: 97%  Weight: 184 lb 6.4 oz (83.6 kg)  Height: '5\' 1"'$  (1.549 m)    GENERAL: The patient is a well-nourished female, in no acute distress. The vital signs are documented above. CARDIOVASCULAR: 2+ radial and 2+ dorsalis pedis pulses bilaterally.  He does have pitting edema from below her knees down to her ankles bilaterally.  She has erythema of most significantly on the medial aspect of the pretibial area bilaterally.  She has no skin ulceration PULMONARY: There is good air exchange  MUSCULOSKELETAL: There are no major deformities or cyanosis. NEUROLOGIC: No focal weakness or paresthesias are detected. SKIN: There are no ulcers or rashes noted.  She does have erythema on the medial aspect of her calves but laterally.  Multiple scattered telangiectasia over both legs and prominent around her ankles bilaterally PSYCHIATRIC: The patient has a normal affect.  DATA:  She underwent lower extremity noninvasive arterial studies today revealing normal ankle arm index and normal waveforms bilaterally  I imaged her saphenous vein with SonoSite.  This shows no enlargement on the right and mild  enlargement at the level of her knee on the left.  MEDICAL ISSUES: Had long discussion with the patient regarding this.  I explained that she does not have any evidence of arterial insufficiency.  I do feel that she has swelling related to venous hypertension from a clinical standpoint.  She did not have a formal venous reflux study.  She has very typical physical findings with the erythema and swelling in both legs and also the extensive telangiectasia around both ankles.  I explained the conservative treatment of this.  I recommended the importance of elevation when possible with her leg slightly higher than her heart.  Also have recommended knee-high graduated compression garments 20 to 30 mmHg.  Instructed her on the use of these and placing them when she feels first arises in the morning and then taking them off in the evening.  Regarding Lasix failure be appropriate to use this on an as-needed basis when her swelling progresses.  She may have a component of fluid overload causing this as well.  She was reassured with this discussion will see [place] again on an as-needed basis   Rosetta Posner, MD [facility] [phone] Pager [phone]  Note: Portions of this report may have been transcribed using voice recognition software.  Every effort has been made to ensure accuracy;  however, inadvertent computerized transcription errors may still be present.

## 2021-04-09 ENCOUNTER — Other Ambulatory Visit: Payer: Self-pay

## 2021-04-09 ENCOUNTER — Ambulatory Visit (HOSPITAL_COMMUNITY)
Admission: RE | Admit: 2021-04-09 | Discharge: 2021-04-09 | Disposition: A | Discharge status: Home / Self Care | Payer: Medicare HMO | Source: Ambulatory Visit | Attending: Nurse Practitioner | Admitting: Nurse Practitioner

## 2021-04-09 ENCOUNTER — Ambulatory Visit (HOSPITAL_COMMUNITY): Payer: Medicare HMO

## 2021-04-09 ENCOUNTER — Other Ambulatory Visit (HOSPITAL_COMMUNITY): Payer: Self-pay | Admitting: Hematology

## 2021-04-09 DIAGNOSIS — Z1231 Encounter for screening mammogram for malignant neoplasm of breast: Secondary | ICD-10-CM

## 2021-04-10 DIAGNOSIS — L503 Dermatographic urticaria: Secondary | ICD-10-CM | POA: Diagnosis not present

## 2021-04-15 ENCOUNTER — Other Ambulatory Visit: Payer: Self-pay

## 2021-04-15 ENCOUNTER — Other Ambulatory Visit: Payer: Self-pay | Admitting: Allergy & Immunology

## 2021-04-15 ENCOUNTER — Ambulatory Visit: Payer: Medicare HMO | Admitting: Allergy & Immunology

## 2021-04-15 ENCOUNTER — Encounter: Payer: Self-pay | Admitting: Allergy & Immunology

## 2021-04-15 VITALS — BP 132/76 | HR 67 | Temp 97.4°F | Resp 18 | Ht 63.0 in | Wt 184.4 lb

## 2021-04-15 DIAGNOSIS — L2089 Other atopic dermatitis: Secondary | ICD-10-CM | POA: Diagnosis not present

## 2021-04-15 DIAGNOSIS — J3089 Other allergic rhinitis: Secondary | ICD-10-CM

## 2021-04-15 DIAGNOSIS — J302 Other seasonal allergic rhinitis: Secondary | ICD-10-CM | POA: Diagnosis not present

## 2021-04-15 DIAGNOSIS — L299 Pruritus, unspecified: Secondary | ICD-10-CM

## 2021-04-15 MED ORDER — FAMOTIDINE 20 MG PO TABS: 20.0000 mg | ORAL_TABLET | Freq: Two times a day (BID) | ORAL | 5 refills | Status: DC | PRN

## 2021-04-15 MED ORDER — MONTELUKAST SODIUM 10 MG PO TABS: 10.0000 mg | ORAL_TABLET | Freq: Every day | ORAL | 5 refills | Status: DC

## 2021-04-15 MED ORDER — TRIAMCINOLONE ACETONIDE 0.1 % EX OINT: 1.0000 "application " | TOPICAL_OINTMENT | Freq: Two times a day (BID) | CUTANEOUS | 1 refills | Status: DC

## 2021-04-15 MED ORDER — CLOBETASOL PROPIONATE 0.05 % EX OINT: 1.0000 "application " | TOPICAL_OINTMENT | Freq: Two times a day (BID) | CUTANEOUS | 2 refills | Status: DC

## 2021-04-15 MED ORDER — FEXOFENADINE HCL 180 MG PO TABS: 180.0000 mg | ORAL_TABLET | Freq: Every day | ORAL | 2 refills | Status: DC

## 2021-04-15 NOTE — Progress Notes (Signed)
FOLLOW UP  Date of Service/Encounter:  04/15/21   Assessment:   Itching - with some clear environmental allergic triggers    Eczema - on triamcinolone ointment BID PRN (once again declined Dupixent today)   Perennial and seasonal allergic rhinitis (grasses, indoor molds and dust mites)   Elevated absolute eosinophil count of 700 (April 2019)   Plan/Recommendations:   1. Itching with sensitizations to grasses, indoor molds, dust mites - Continue with Allegra (fexofenadine) 172m UP TO TWICE DAILY + Pepcid 259mUP TO TWICE DAILY - Continue with Singulair (montelukast) 1038mAILY. - You can use the hydroxyzine every 6-8 hours on top of that.   2. Eczema - Continue with moisturizing twice daily as as you are doing. - Continue with triamcinolone 0.1% ointment mixed 1:1 with Eucerin twice daily as needed. - ADD ON clobetasol 0.05% ointment twice daily for TWO WEEKS at a time (can use with thick skinned areas, avoiding the face, fingers, axilla, and groin. - The clobetasol ointment should clear up the rash on your arm and your thigh.  - Dupixent could address both the itching and the eczema.   3. Return in about 6 months (around 10/16/2021).   Subjective:   PatARMIDA VICKROY a 74 62o. female presenting today for follow up of  Chief Complaint  Patient presents with   Allergic Rhinitis    Pruritus    Saw her PCP for constant itching on her arms and legs - red/ inflamed with possible hive. Took hydroxyzine and that helped. She     PatQUANITA BARONAs a history of the following: Patient Active Problem List   Diagnosis Date Noted   Abnormal stress test 08/02/2019   Postinflammatory skin changes 06/14/2019   Encounter for colorectal cancer screening 06/14/2019   Encounter for gynecological examination with Papanicolaou smear of cervix 06/14/2019   Lymph node enlargement    Flexural atopic dermatitis 01/31/2018   Itching 11/01/2017   Seasonal and perennial allergic rhinitis  11/01/2017   History of breast cancer in female 04/21/2016   Routine cervical smear 04/21/2016   Vaginal atrophy 04/21/2016   OVERWEIGHT 01/28/2010   LEG CRAMPS, NOCTURNAL 01/28/2010   ELECTROCARDIOGRAM, ABNORMAL 01/28/2010   CARCINOMA IN SITU, RIGHT BREAST 11/27/2009   HYPERLIPIDEMIA 11/27/2009   ARM PAIN, LEFT 11/27/2009   HYPOTHYROIDISM 11/26/2009   Essential hypertension 11/26/2009    History obtained from: chart review and patient.  PatTramya a 74 25o. female presenting for a follow up visit.  She has a longstanding history of itching with some clear environmental triggers.  She has eczema as well as perennial and seasonal allergic rhinitis.  She is sensitized to grasses, indoor molds, and dust mites.  She also had an elevated absolute eosinophil count of 700.  At the last visit, we continue with Allegra and Pepcid twice daily.  We also recommended hydroxyzine every 6-8 hours as needed.  She was concerned this was related to generic Synthroid, so I sent in brand-name Synthroid to use instead.  For her eczema, would continue with triamcinolone mixed with Eucerin as needed.  We discussed Dupixent on a number of occasions but she has refused.  We did do labs at the last visit which showed mildly elevated IgE to alpha gal of 0.16.  I recommended avoiding mammalian meats to see if that helped at all, although it was not very high.  Otherwise, CBC, CRP, tryptase, ESR, and ANA were all negative.  Since the last visit, she has been  fair. She went to get a total new bed system. She used the dust mite coverings on the new bedding. This helped a lot with the itching at night.  She had a rash on her left arm. She has had hives. She also the same eczematous area on her right thigh. She does have TAC 0.5% ointment to use as needed.   Her Allegra was stopped by the NP. Montelukast and famotidine was recommended instead. She has been using hydroxyzine as needed. She is frustrated that she is getting  various recommendations from many different providers.   She was diagnosed with venous insufficiency and she was Dr. Donnetta Hutching in case she needed surgery. This was NOT recommended so she is following up as needed with him. She has this mostly on her lower extremities.    Otherwise, there have been no changes to her past medical history, surgical history, family history, or social history.    Review of Systems  Constitutional: Negative.  Negative for chills, fever, malaise/fatigue and weight loss.  HENT: Negative.  Negative for congestion, ear discharge and ear pain.   Eyes:  Negative for pain, discharge and redness.  Respiratory:  Negative for cough, sputum production, shortness of breath and wheezing.   Cardiovascular: Negative.  Negative for chest pain and palpitations.  Gastrointestinal:  Negative for abdominal pain, constipation, diarrhea, heartburn, nausea and vomiting.  Skin:  Positive for itching and rash.  Neurological:  Negative for dizziness and headaches.  Endo/Heme/Allergies:  Negative for environmental allergies. Does not bruise/bleed easily.      Objective:   Blood pressure 132/76, pulse 67, temperature (!) 97.4 F (36.3 C), resp. rate 18, height $RemoveBe'5\' 3"'qbuiMMfkk$  (1.6 m), weight 184 lb 6.4 oz (83.6 kg), SpO2 97 %. Body mass index is 32.66 kg/m.   Physical Exam:  Physical Exam Constitutional:      Appearance: She is well-developed.  HENT:     Head: Normocephalic and atraumatic.     Right Ear: Tympanic membrane, ear canal and external ear normal.     Left Ear: Tympanic membrane, ear canal and external ear normal.     Nose: No nasal deformity, septal deviation, mucosal edema or rhinorrhea.     Right Turbinates: Enlarged and swollen.     Left Turbinates: Enlarged and swollen.     Right Sinus: No maxillary sinus tenderness or frontal sinus tenderness.     Left Sinus: No maxillary sinus tenderness or frontal sinus tenderness.     Mouth/Throat:     Mouth: Mucous membranes are not  pale and not dry.     Pharynx: Uvula midline.  Eyes:     General: Lids are normal. No allergic shiner.       Right eye: No discharge.        Left eye: No discharge.     Conjunctiva/sclera: Conjunctivae normal.     Right eye: Right conjunctiva is not injected. No chemosis.    Left eye: Left conjunctiva is not injected. No chemosis.    Pupils: Pupils are equal, round, and reactive to light.  Cardiovascular:     Rate and Rhythm: Normal rate and regular rhythm.     Heart sounds: Normal heart sounds.  Pulmonary:     Effort: Pulmonary effort is normal. No tachypnea, accessory muscle usage or respiratory distress.     Breath sounds: Normal breath sounds. No wheezing, rhonchi or rales.  Chest:     Chest wall: No tenderness.  Lymphadenopathy:     Cervical: No cervical adenopathy.  Skin:    General: Skin is warm.     Capillary Refill: Capillary refill takes less than 2 seconds.     Coloration: Skin is not pale.     Findings: Rash present. No abrasion, erythema or petechiae. Rash is crusting and papular. Rash is not vesicular.     Comments: She does have some rather large eczematous lesions.   Neurological:     Mental Status: She is alert.  Psychiatric:        Behavior: Behavior is cooperative.     Diagnostic studies: none       Salvatore Marvel, MD  [facility] [place] [facility] [place]

## 2021-04-15 NOTE — Patient Instructions (Addendum)
1. Itching with sensitizations to grasses, indoor molds, dust mites - Continue with Allegra (fexofenadine) '180mg'$  UP TO TWICE DAILY + Pepcid '20mg'$  UP TO TWICE DAILY - Continue with Singulair (montelukast) '10mg'$  DAILY. - You can use the hydroxyzine every 6-8 hours on top of that.   2. Eczema - Continue with moisturizing twice daily as as you are doing. - Continue with triamcinolone 0.1% ointment mixed 1:1 with Eucerin twice daily as needed. - ADD ON clobetasol 0.05% ointment twice daily for TWO WEEKS at a time (can use with thick skinned areas, avoiding the face, fingers, axilla, and groin. - The clobetasol ointment should clear up the rash on your arm and your thigh.   3. Return in about 6 months (around 10/16/2021).    Please inform us of any Emergency Department visits, hospitalizations, or changes in symptoms. Call us before going to the ED for breathing or allergy symptoms since we might be able to fit you in for a sick visit. Feel free to contact us anytime with any questions, problems, or concerns.  It was a pleasure to see you again today!  Websites that have reliable patient information: 1. American Academy of Asthma, Allergy, and Immunology: www.aaaai.org 2. Food Allergy Research and Education (FARE): foodallergy.org 3. Mothers of Asthmatics: http://www.asthmacommunitynetwork.org 4. American College of Allergy, Asthma, and Immunology: www.acaai.org   COVID-19 Vaccine Information can be found at: ShippingScam.co.uk For questions related to vaccine distribution or appointments, please email [email or web address] or call [phone].   We realize that you might be concerned about having an allergic reaction to the COVID19 vaccines. To help with that concern, WE ARE OFFERING THE COVID19 VACCINES IN OUR OFFICE! Ask the front desk for dates!     "Like" [place] on Facebook and Instagram for our latest updates!      A healthy  democracy works best when [employer] participate! Make sure you are registered to vote! If you have moved or changed any of your contact information, you will need to get this updated before voting!  In some cases, you MAY be able to register to vote online: [email or web address]

## 2021-04-22 ENCOUNTER — Ambulatory Visit: Payer: Medicare HMO | Admitting: Cardiovascular Disease

## 2021-04-23 ENCOUNTER — Other Ambulatory Visit (HOSPITAL_COMMUNITY): Payer: Self-pay | Admitting: Internal Medicine

## 2021-04-23 DIAGNOSIS — Z1231 Encounter for screening mammogram for malignant neoplasm of breast: Secondary | ICD-10-CM

## 2021-05-03 ENCOUNTER — Other Ambulatory Visit: Payer: Self-pay

## 2021-05-03 ENCOUNTER — Emergency Department (HOSPITAL_COMMUNITY): Payer: Medicare HMO

## 2021-05-03 ENCOUNTER — Emergency Department (HOSPITAL_COMMUNITY)
Admission: EM | Admit: 2021-05-03 | Discharge: 2021-05-03 | Disposition: A | Discharge status: Home / Self Care | Payer: Medicare HMO | Attending: Emergency Medicine | Admitting: Emergency Medicine

## 2021-05-03 DIAGNOSIS — W108XXA Fall (on) (from) other stairs and steps, initial encounter: Secondary | ICD-10-CM | POA: Insufficient documentation

## 2021-05-03 DIAGNOSIS — Z79899 Other long term (current) drug therapy: Secondary | ICD-10-CM | POA: Diagnosis not present

## 2021-05-03 DIAGNOSIS — S42125A Nondisplaced fracture of acromial process, left shoulder, initial encounter for closed fracture: Secondary | ICD-10-CM | POA: Insufficient documentation

## 2021-05-03 DIAGNOSIS — I2510 Atherosclerotic heart disease of native coronary artery without angina pectoris: Secondary | ICD-10-CM | POA: Insufficient documentation

## 2021-05-03 DIAGNOSIS — Z853 Personal history of malignant neoplasm of breast: Secondary | ICD-10-CM | POA: Insufficient documentation

## 2021-05-03 DIAGNOSIS — S42035A Nondisplaced fracture of lateral end of left clavicle, initial encounter for closed fracture: Secondary | ICD-10-CM | POA: Diagnosis not present

## 2021-05-03 DIAGNOSIS — M7989 Other specified soft tissue disorders: Secondary | ICD-10-CM | POA: Diagnosis not present

## 2021-05-03 DIAGNOSIS — E039 Hypothyroidism, unspecified: Secondary | ICD-10-CM | POA: Insufficient documentation

## 2021-05-03 DIAGNOSIS — S42032A Displaced fracture of lateral end of left clavicle, initial encounter for closed fracture: Secondary | ICD-10-CM | POA: Diagnosis not present

## 2021-05-03 DIAGNOSIS — S4992XA Unspecified injury of left shoulder and upper arm, initial encounter: Secondary | ICD-10-CM | POA: Diagnosis not present

## 2021-05-03 MED ORDER — HYDROCODONE-ACETAMINOPHEN 5-325 MG PO TABS: 1.0000 | ORAL_TABLET | Freq: Four times a day (QID) | ORAL | 0 refills | Status: DC | PRN

## 2021-05-03 NOTE — ED Provider Notes (Signed)
[facility] EMERGENCY DEPARTMENT Provider Note  CSN: [id number] Arrival date & time: 05/03/21 1644    History Chief Complaint  Patient presents with   Maria Ritter is a 75 y.o. female reports she stumbled and fell landing on her L shoulder earlier today. She is complaining of pain in L shoulder, worse with movement. Denies any head injury or LOC.    Past Medical History:  Diagnosis Date   Breast cancer ([place]) 2010   Right Lumpectomy and XRT   Carotid artery disease (HCC)    Mild to moderate bilateral ICA disease, left greater than right 07/2015   Essential hypertension    GERD (gastroesophageal reflux disease)    Glaucoma    Hyperlipidemia    Hypothyroidism    Osteoarthritis    Polymyalgia rheumatica (HCC)     Past Surgical History:  Procedure Laterality Date   AXILLARY LYMPH NODE BIOPSY Right 05/21/2019   Procedure: RIGHT AXILLARY LYMPH NODE BIOPSY;  Surgeon: Aviva Signs, MD;  Location: [facility];  Service: General;  Laterality: Right;   BASAL CELL CARCINOMA EXCISION  2010   Right breast   BREAST LUMPECTOMY Right 2010   ENDOMETRIAL ABLATION  2002   FINGER SURGERY     Minor surgical intervention following a crush injury to the left fourth finger   TOE SURGERY Right 07/30/2020   2nd toe on right foot   TOOTH EXTRACTION  01/2019    Family History  Problem Relation Age of Onset   Pneumonia Mother    Parkinsonism Mother    Heart failure Father     Social History   Tobacco Use   Smoking status: Never   Smokeless tobacco: Never  Vaping Use   Vaping Use: Never used  Substance Use Topics   Alcohol use: No    Alcohol/week: 0.0 standard drinks   Drug use: No     Home Medications Prior to Admission medications   Medication Sig Start Date End Date Taking? Authorizing Provider  amLODipine (NORVASC) 5 MG tablet Take 5 mg by mouth daily.    [provider]  CALCIUM CITRATE-VITAMIN D PO Take 1 tablet by mouth daily.     [provider]   Cholecalciferol (VITAMIN D3 PO) Take 2,000 Units by mouth 2 (two) times daily.    [provider]  clobetasol ointment (TEMOVATE) [id number] % Apply 1 application topically 2 (two) times daily. Do not use longer than two weeks at a time. 04/15/21   [name], MD  famotidine (PEPCID) 20 MG tablet Take 1 tablet (20 mg total) by mouth 2 (two) times daily as needed for heartburn or indigestion. 04/15/21   [name], MD  fexofenadine (ALLEGRA) 180 MG tablet Take 1 tablet (180 mg total) by mouth daily. 04/15/21   [name], MD  fluticasone [facility]) 50 MCG/ACT nasal spray Place into both nostrils daily.    [provider]  furosemide (LASIX) 40 MG tablet Take 40 mg by mouth.    [provider]  GLUCOSAMINE-CHONDROITIN PO Take 1 tablet by mouth 2 (two) times daily.     [provider]  HYDROcodone-acetaminophen (NORCO/VICODIN) 5-325 MG tablet Take 1-2 tablets by mouth every 6 (six) hours as needed. 05/03/21   Truddie Hidden, MD  hydrOXYzine (VISTARIL) 25 MG capsule Take 25 mg by mouth every 6 (six) hours as needed. 01/02/20   [provider]  ketoconazole (NIZORAL) 2 % cream Apply 1 application topically daily.  [provider]  levothyroxine (SYNTHROID) 150 MCG tablet Take 150 mcg by mouth daily. 04/15/20   [provider]  levothyroxine (SYNTHROID) 150 MCG tablet Take 1 tablet (150 mcg total) by mouth daily before breakfast. 10/17/20 11/16/20  [name], MD  lisinopril (PRINIVIL,ZESTRIL) 10 MG tablet Take 10 mg by mouth daily.    [provider]  Magnesium 250 MG TABS Take 250 mg by mouth daily.     [provider]  meloxicam (MOBIC) 7.5 MG tablet Take 7.5 mg by mouth daily.    [provider]  methocarbamol (ROBAXIN) 500 MG tablet Take 500 mg by mouth 3 (three) times daily as needed for muscle spasms.  04/04/19   [provider]  metroNIDAZOLE (METROGEL) 0.75 % gel  Apply 1 application topically 2 (two) times daily.    [provider]  montelukast (SINGULAIR) 10 MG tablet Take 1 tablet (10 mg total) by mouth at bedtime. 04/15/21   [name], MD  Omega-3 1000 MG CAPS Take 1,000 mg by mouth 2 (two) times daily.    [provider]  rosuvastatin (CRESTOR) 10 MG tablet Take 10 mg by mouth daily. 04/24/20   [provider]  silver sulfADIAZINE (SILVADENE) 1 % cream Apply 1 application topically daily. 06/14/19   Estill Dooms, NP  sodium chloride (OCEAN) 0.65 % SOLN nasal spray Place 1 spray into both nostrils as needed for congestion.    [provider]  triamcinolone ointment (KENALOG) 0.1 % Apply 1 application topically 2 (two) times daily. 04/15/21   [name], MD  Turmeric 500 MG CAPS Take 500 mg by mouth daily.    [provider]  vitamin B-12 (CYANOCOBALAMIN) 1000 MCG tablet Take 500 mcg by mouth daily.    [provider]  zinc gluconate 50 MG tablet Take 50 mg by mouth daily.    [provider]     Allergies    Mango flavor, Diclofenac sodium, Erythromycin, Ivp dye [iodinated diagnostic agents], Penicillins, Sulfonamide derivatives, Neosporin [bacitracin-polymyxin b], and Tape   Review of Systems   Review of Systems A comprehensive review of systems was completed and negative except as noted in HPI.    Physical Exam BP (!) 176/98 (BP Location: Right Arm)   Pulse 97   Temp 98.6 F (37 C) (Oral)   Ht '5\' 3"'$  (1.6 m)   Wt 84 kg   SpO2 95%   BMI 32.80 kg/m   Physical Exam Vitals and nursing note reviewed.  HENT:     Head: Normocephalic.     Nose: Nose normal.  Eyes:     Extraocular Movements: Extraocular movements intact.  Pulmonary:     Effort: Pulmonary effort is normal.  Musculoskeletal:        General: Swelling and tenderness present.     Cervical back: Neck supple.     Comments: Tenderness L shoulder, decreased ROM, NVI  Skin:    Findings:  Bruising (L shoulder) present. No rash (on exposed skin).  Neurological:     Mental Status: She is alert and oriented to person, place, and time.  Psychiatric:        Mood and Affect: Mood normal.     ED Results / Procedures / Treatments   Labs (all labs ordered are listed, but only abnormal results are displayed) Labs Reviewed - No data to display  EKG None  Radiology DG Shoulder Left  Result Date: 05/03/2021 CLINICAL DATA:  Fall approximally 5 steps with  shoulder pain. EXAM: LEFT SHOULDER - 2+ VIEW COMPARISON:  None. FINDINGS: There is a comminuted fracture of the distal clavicle. The under surface of the clavicle appears well aligned with the a chromium, however the acromioclavicular distance appears increased which may be a consequence of the fracture. There is no dislocation of the glenohumeral joint. There is associated soft tissue swelling. IMPRESSION: Comminuted distal left clavicle fracture. Electronically Signed   By: Zerita Boers M.D.   On: 05/03/2021 18:59    Procedures Procedures  Medications Ordered in the ED Medications - No data to display   MDM Rules/Calculators/A&P MDM Xray shows distal clavicle fracture. Will place in sling, Norco for pain. Ortho follow up.   ED Course  I have reviewed the triage vital signs and the nursing notes.  Pertinent labs & imaging results that were available during my care of the patient were reviewed by me and considered in my medical decision making (see chart for details).     Final Clinical Impression(s) / ED Diagnoses Final diagnoses:  Closed nondisplaced fracture of acromial end of left clavicle, initial encounter    Rx / DC Orders ED Discharge Orders          Ordered    HYDROcodone-acetaminophen (NORCO/VICODIN) 5-325 MG tablet  Every 6 hours PRN,   Status:  Discontinued        05/03/21 1933    HYDROcodone-acetaminophen (NORCO/VICODIN) 5-325 MG tablet  Every 6 hours PRN        05/03/21 1933              Truddie Hidden, MD 05/03/21 1936

## 2021-05-03 NOTE — ED Notes (Signed)
ED Provider at bedside. 

## 2021-05-03 NOTE — ED Triage Notes (Signed)
Pt fell off her back steps today striking her left shoulder. Pt requesting a xray. Pt hypertensive w/ hx of the same.

## 2021-05-03 NOTE — ED Notes (Signed)
Pt verbalized understanding of no driving and to use caution within 4 hours of taking pain meds due to meds cause drowsiness 

## 2021-05-05 ENCOUNTER — Encounter: Payer: Self-pay | Admitting: Orthopedic Surgery

## 2021-05-05 ENCOUNTER — Ambulatory Visit: Payer: Medicare HMO | Admitting: Orthopedic Surgery

## 2021-05-05 ENCOUNTER — Other Ambulatory Visit: Payer: Self-pay

## 2021-05-05 VITALS — BP 147/80 | HR 87 | Ht 63.0 in | Wt 185.0 lb

## 2021-05-05 DIAGNOSIS — S42032A Displaced fracture of lateral end of left clavicle, initial encounter for closed fracture: Secondary | ICD-10-CM | POA: Diagnosis not present

## 2021-05-05 MED FILL — Hydrocodone-Acetaminophen Tab 5-325 MG: ORAL | Qty: 6 | Status: CN

## 2021-05-05 NOTE — Progress Notes (Signed)
New Patient Visit  Assessment: Maria Ritter is a 75 y.o. RHD female with the following: 1. Closed displaced fracture of acromial end of left clavicle, initial encounter  Plan: Reviewed radiographs with patient today which demonstrates a left, minimally displaced distal clavicle fracture.  On physical exam, she has tenderness to palpation over the anterior chest.  She has pain with range of motion approaching shoulder height.  The ecchymosis is impressive at this point.  I discussed the anticipated outcomes.  I advised her to remain in a sling when she is upright for the next 2 weeks.  If sitting at home, she can take the sling off, work on elbow and wrist range of motion.  She stated her understanding.  We will see her back in 2-3 weeks.  At that time, we may allow her to start initiating some gentle shoulder range of motion.  All questions were answered and she is amenable to this plan.  She can continue with medications as needed.   Follow-up: Return for 2-3 weeks.  Subjective:  Chief Complaint  Patient presents with   Fracture    Lt clavicle fx DOI 05/03/21    History of Present Illness: Maria Ritter is a 75 y.o. female who presents for evaluation of left shoulder pain.  2 days ago, she states she was walking up the steps at her neighbor's house, when she slipped due to the rain.  She is not sure how she fell.  She noted immediate pain in her left shoulder.  She presented to the emergency department.  She was diagnosed with a distal left clavicle fracture, placed in a sling.  She has been wearing the sling at all times since the.  She is taking Tylenol for pain.  She was given narcotic pain medications, but she has not been taking this.  She also notes a small amount of numbness and tingling in the small finger to the left hand.   Review of Systems: No fevers or chills + Numbness and tingling No chest pain No shortness of breath No bowel or bladder dysfunction No GI distress No  headaches   Medical History:  Past Medical History:  Diagnosis Date   Breast cancer ([place]) 2010   Right Lumpectomy and XRT   Carotid artery disease (HCC)    Mild to moderate bilateral ICA disease, left greater than right 07/2015   Essential hypertension    GERD (gastroesophageal reflux disease)    Glaucoma    Hyperlipidemia    Hypothyroidism    Osteoarthritis    Polymyalgia rheumatica (HCC)     Past Surgical History:  Procedure Laterality Date   AXILLARY LYMPH NODE BIOPSY Right 05/21/2019   Procedure: RIGHT AXILLARY LYMPH NODE BIOPSY;  Surgeon: Aviva Signs, MD;  Location: [facility];  Service: General;  Laterality: Right;   BASAL CELL CARCINOMA EXCISION  2010   Right breast   BREAST LUMPECTOMY Right 2010   ENDOMETRIAL ABLATION  2002   FINGER SURGERY     Minor surgical intervention following a crush injury to the left fourth finger   TOE SURGERY Right 07/30/2020   2nd toe on right foot   TOOTH EXTRACTION  01/2019    Family History  Problem Relation Age of Onset   Pneumonia Mother    Parkinsonism Mother    Heart failure Father    Social History   Tobacco Use   Smoking status: Never   Smokeless tobacco: Never  Vaping Use   Vaping Use: Never used  Substance Use Topics   Alcohol use: No    Alcohol/week: 0.0 standard drinks   Drug use: No    Allergies  Allergen Reactions   Mango Flavor Hives   Diclofenac Sodium Other (See Comments)    Too strong, bloating, didn't agree with stomach   Erythromycin Nausea Only   Ivp Dye [Iodinated Diagnostic Agents]    Penicillins Itching    Did it involve swelling of the face/tongue/throat, SOB, or low BP? No Did it involve sudden or severe rash/hives, skin peeling, or any reaction on the inside of your mouth or nose? No Did you need to seek medical attention at a hospital or doctor's office? No When did it last happen?   10 years ago   If all above answers are "NO", may proceed with cephalosporin use.    Sulfonamide  Derivatives Itching   Neosporin [Bacitracin-Polymyxin B] Itching   Tape Hives and Rash    Tape or gel    No outpatient medications have been marked as taking for the 05/05/21 encounter (Office Visit) with Mordecai Rasmussen, MD.    Objective: BP (!) 147/80   Pulse 87   Ht '5\' 3"'$  (1.6 m)   Wt 185 lb (83.9 kg)   BMI 32.77 kg/m   Physical Exam:  General: Elderly female., Alert and oriented., and No acute distress. Gait: Normal gait.  Evaluation of left shoulder demonstrates ecchymosis over the shoulder, and anterior chest wall.  She has tenderness to palpation along the length of the left clavicle.  She tolerates elbow and wrist range of motion, below the level of her shoulder.  She has pain with forward flexion approaching shoulder height.  Fingers are warm and well-perfused.  Slightly decreased sensation to the small finger.  2+ radial pulse.  IMAGING: I personally reviewed images previously obtained from the ED  X-ray of the left shoulder demonstrates a mildly comminuted, minimally displaced distal left clavicle fracture  New Medications:  No orders of the defined types were placed in this encounter.     Mordecai Rasmussen, MD  05/05/2021 3:03 PM

## 2021-05-07 ENCOUNTER — Telehealth: Payer: Self-pay | Admitting: Orthopedic Surgery

## 2021-05-07 DIAGNOSIS — Z23 Encounter for immunization: Secondary | ICD-10-CM | POA: Diagnosis not present

## 2021-05-07 NOTE — Telephone Encounter (Signed)
Patient called and states she has about 3 questions for the nurse.  She asks that you call her.  Thanks

## 2021-05-07 NOTE — Telephone Encounter (Signed)
Returned pt call, and questions answered. Pt verbalized understanding, and no further questions at this time.

## 2021-05-14 DIAGNOSIS — Z23 Encounter for immunization: Secondary | ICD-10-CM | POA: Diagnosis not present

## 2021-05-19 ENCOUNTER — Encounter: Payer: Self-pay | Admitting: Orthopedic Surgery

## 2021-05-19 ENCOUNTER — Ambulatory Visit (INDEPENDENT_AMBULATORY_CARE_PROVIDER_SITE_OTHER): Payer: Medicare HMO | Admitting: Orthopedic Surgery

## 2021-05-19 ENCOUNTER — Ambulatory Visit: Payer: Medicare HMO

## 2021-05-19 ENCOUNTER — Other Ambulatory Visit: Payer: Self-pay

## 2021-05-19 DIAGNOSIS — S42032D Displaced fracture of lateral end of left clavicle, subsequent encounter for fracture with routine healing: Secondary | ICD-10-CM | POA: Diagnosis not present

## 2021-05-19 DIAGNOSIS — I739 Peripheral vascular disease, unspecified: Secondary | ICD-10-CM | POA: Diagnosis not present

## 2021-05-19 NOTE — Progress Notes (Signed)
Orthopaedic Clinic Return  Assessment: Maria Ritter is a 75 y.o. female with the following: Left distal clavicle fracture; nonop in a sling  Plan: Radiographs demonstrates stable overall alignment.  Ok to gradually come out of the sling more often.  Limit overhead lifting.  No lifting more than a gallon of milk at this point. In 2-3 weeks, can start to use the left arm on a more consistent basis.  Anticipate allowing her to use arm as tolerated in 1 month.    Follow-up: Return in about 4 weeks (around 06/16/2021).   Subjective:  Chief Complaint  Patient presents with   Fracture    Lt clavicle fx DOI 05/03/21     History of Present Illness: Maria Ritter is a 75 y.o. female who returns to clinic for repeat evaluation of her left shoulder.  She sustained a distal clavicle fracture 2 weeks ago.  She states her pain and swelling is improving.  She is using the sling most of the time.  She states she is able to move her shoulder more freely.  No numbness or tingling.    Review of Systems: No fevers or chills No numbness or tingling No chest pain No shortness of breath No bowel or bladder dysfunction No GI distress No headaches   Objective: There were no vitals taken for this visit.  Physical Exam:  Alert and oriented, no acute distress  Left shoulder with improved swelling.  Persistent ecchymosis, but this is improving.  Tenderness within the superior shoulder musculature.  Tender over the distal clavicle.  Sensation intact to the left hand.  Fingers are warm and well perfused.   IMAGING: I personally ordered and reviewed the following images:  XR of the left clavicle demonstrates a comminuted distal clavicle fracture.  Mild elevation of clavicle in relation to the acromion.  Small fracture fragments with mild displacement.  No interval displacement.  No obvious callus formation.  No acute injury.  Impression: comminuted left distal clavicle fracture in stable and  acceptable alignment.   Mordecai Rasmussen, MD 05/19/2021 10:41 AM

## 2021-06-16 ENCOUNTER — Other Ambulatory Visit: Payer: Self-pay

## 2021-06-16 ENCOUNTER — Encounter: Payer: Self-pay | Admitting: Orthopedic Surgery

## 2021-06-16 ENCOUNTER — Ambulatory Visit (INDEPENDENT_AMBULATORY_CARE_PROVIDER_SITE_OTHER): Payer: Medicare HMO | Admitting: Orthopedic Surgery

## 2021-06-16 ENCOUNTER — Ambulatory Visit: Payer: Medicare HMO

## 2021-06-16 DIAGNOSIS — S42032D Displaced fracture of lateral end of left clavicle, subsequent encounter for fracture with routine healing: Secondary | ICD-10-CM

## 2021-06-16 NOTE — Progress Notes (Signed)
Orthopaedic Clinic Return  Assessment: Maria Ritter is a 75 y.o. female with the following: Left distal clavicle fracture; nonop in a sling  Plan: Radiographs remain unchanged.  Evidence of healing.  His pain is improved.  She cannot come out of the sling.  Gradually increase her activity using her left arm.  She should limit her lifting for an additional 2 weeks, or until she has regained full range of motion.  It is okay for her to attempt to use her riding lawnmower.  Follow-up in approximately 4 weeks for repeat evaluation.   Follow-up: Return in about 4 weeks (around 07/14/2021).   Subjective:  Chief Complaint  Patient presents with   Clavicle Fx    LEFT// fracture care/ xrays    History of Present Illness: Maria Ritter is a 75 y.o. female who returns to clinic for repeat evaluation of her left shoulder.  Injury was sustained approximately 6 weeks ago.  Her pain is improved.  She notes that she can move her arm more, and tolerates more activity without pain.  No numbness or tingling.  She is hoping to get out of the sling.  Review of Systems: No fevers or chills No numbness or tingling No chest pain No shortness of breath No bowel or bladder dysfunction No GI distress No headaches   Objective: There were no vitals taken for this visit.  Physical Exam:  Alert and oriented, no acute distress  Left shoulder without residual swelling.  Mild tenderness within the superior shoulder musculature.  Sensation intact to the left hand.  Fingers are warm and well perfused.  Abduction to 90 degrees.  Forward flexion to 130 degrees.  Internal rotation to her lumbar spine.  She is unable to touch the contralateral shoulder with her hand, but is getting very close.   IMAGING: I personally ordered and reviewed the following images:  X-ray of the left clavicle were obtained in clinic today and demonstrates a comminuted, distal clavicle fracture.  Overall alignment remains  unchanged.  There has been some interval consolidation at the fracture site.  No further displacement is noted.  No acute injuries.  Impression: Distal left clavicle fracture in stable alignment.  Mordecai Rasmussen, MD 06/16/2021 12:51 PM

## 2021-06-16 NOTE — Patient Instructions (Signed)

## 2021-06-17 ENCOUNTER — Ambulatory Visit (INDEPENDENT_AMBULATORY_CARE_PROVIDER_SITE_OTHER): Payer: Medicare HMO | Admitting: Adult Health

## 2021-06-17 ENCOUNTER — Encounter: Payer: Self-pay | Admitting: Adult Health

## 2021-06-17 VITALS — BP 150/71 | HR 52 | Ht 62.25 in | Wt 181.0 lb

## 2021-06-17 DIAGNOSIS — Z1211 Encounter for screening for malignant neoplasm of colon: Secondary | ICD-10-CM | POA: Diagnosis not present

## 2021-06-17 DIAGNOSIS — Z853 Personal history of malignant neoplasm of breast: Secondary | ICD-10-CM | POA: Diagnosis not present

## 2021-06-17 DIAGNOSIS — Z01419 Encounter for gynecological examination (general) (routine) without abnormal findings: Secondary | ICD-10-CM | POA: Diagnosis not present

## 2021-06-17 LAB — HEMOCCULT GUIAC POC 1CARD (OFFICE): Fecal Occult Blood, POC: NEGATIVE

## 2021-06-17 MED ORDER — SILVER SULFADIAZINE 1 % EX CREA: 1.0000 "application " | TOPICAL_CREAM | Freq: Every day | CUTANEOUS | 1 refills | Status: AC

## 2021-06-17 NOTE — Progress Notes (Signed)
Patient ID: Maria Ritter, female   DOB: 01-Jun-1946, 75 y.o.   MRN: [id number] History of Present Illness: Jyllian is a 75 year old white female,widowed, PM in for a well woman gyn exam. Lab Results  Component Value Date   DIAGPAP  06/14/2019    - Negative for intraepithelial lesion or malignancy (NILM)   Hemlock Negative 06/14/2019   She fractured left collar bone in September, doing good, is using a cane has OA in knees. PCP is Dr Nevada Crane.   Current Medications, Allergies, Past Medical History, Past Surgical History, Family History and Social History were reviewed in [employer] record.     Review of Systems: Patient denies any headaches, hearing loss, fatigue, blurred vision, shortness of breath, chest pain, abdominal pain, problems with bowel movements, urination, or intercourse. (Not active). No mood swings.  Has OA in knees Denies any vaginal bleeding  Physical Exam:BP (!) 150/71 (BP Location: Right Arm, Patient Position: Sitting, Cuff Size: Normal)   Pulse (!) 52   Ht 5' 2.25" (1.581 m)   Wt 181 lb (82.1 kg)   BMI 32.84 kg/m   General:  Well developed, well nourished, no acute distress Skin:  Warm and dry Neck:  Midline trachea, normal thyroid, good ROM, no lymphadenopathy,no carotid bruits heard Lungs; Clear to auscultation bilaterally Breast:  No dominant palpable mass, retraction, or nipple discharge, has firmness right outer breast sp lumpectomy and radiation Cardiovascular: Regular rate and rhythm Abdomen:  Soft, non tender, no hepatosplenomegaly Pelvic:  External genitalia is normal in appearance, no lesions.  The vagina is pale,dry and atrophic, pediatric speculum used. Urethra has no lesions or masses. The cervix is atrophic.  Uterus is felt to be normal size, shape, and contour.  No adnexal masses or tenderness noted.Bladder is non tender, no masses felt. Rectal: Good sphincter tone, no polyps, or hemorrhoids felt.  Hemoccult  negative. Extremities/musculoskeletal:  No swelling,+varicosities noted, no clubbing or cyanosis Psych:  No mood changes, alert and cooperative,seems happy AA is 0 Fall risk is moderate Depression screen [facility] 2/9 06/17/2021 06/14/2019  Decreased Interest 0 0  Down, Depressed, Hopeless 0 0  PHQ - 2 Score 0 0  Altered sleeping 0 -  Tired, decreased energy 1 -  Change in appetite 0 -  Feeling bad or failure about yourself  0 -  Trouble concentrating 0 -  Moving slowly or fidgety/restless 0 -  Suicidal thoughts 0 -  PHQ-9 Score 1 -    GAD 7 : Generalized Anxiety Score 06/17/2021  Nervous, Anxious, on Edge 0  Control/stop worrying 0  Worry too much - different things 0  Trouble relaxing 0  Restless 0  Easily annoyed or irritable 0  Afraid - awful might happen 0  Total GAD 7 Score 0      Upstream - 06/17/21 0847       Pregnancy Intention Screening   Does the patient want to become pregnant in the next year? No    Does the patient's partner want to become pregnant in the next year? No    Would the patient like to discuss contraceptive options today? No      Contraception Wrap Up   Current Method Abstinence   PM   End Method Abstinence   PM   Contraception Counseling Provided No           .Examination chaperoned by Levy Pupa LPN  Impression and Plan: 1. Encounter for well woman exam with routine gynecological exam Paps no  longer needed Can come in 2 years if desired or prn problems Sees PCP regularly Labs with PCP  Mammogram yearly Can stop colonoscopy if no problems,could do cologuard, but talk with PCP  2. Encounter for screening fecal occult blood testing   3. History of breast cancer in female Get mammogram yearly Refilled silvadene, at her request, uses if has irritation on breast, post radiation Meds ordered this encounter  Medications   silver sulfADIAZINE (SILVADENE) 1 % cream    Sig: Apply 1 application topically daily.    Dispense:  25 g     Refill:  1    Order Specific Question:   Supervising Provider    Answer:   Tania Ade H [[id number]]

## 2021-06-22 ENCOUNTER — Telehealth: Payer: Self-pay

## 2021-06-22 NOTE — Telephone Encounter (Signed)
Patient called stating that she hit her left arm just below the elbow on Saturday and it jarred her L Shoulder. She is asking should she come in and get an xray to be sure there is no more damage done.  Please advise

## 2021-06-23 ENCOUNTER — Other Ambulatory Visit: Payer: Self-pay

## 2021-06-23 ENCOUNTER — Ambulatory Visit (INDEPENDENT_AMBULATORY_CARE_PROVIDER_SITE_OTHER): Payer: Medicare HMO | Admitting: Orthopedic Surgery

## 2021-06-23 ENCOUNTER — Encounter: Payer: Self-pay | Admitting: Orthopedic Surgery

## 2021-06-23 ENCOUNTER — Ambulatory Visit: Payer: Medicare HMO

## 2021-06-23 VITALS — Ht 62.25 in | Wt 181.0 lb

## 2021-06-23 DIAGNOSIS — M25512 Pain in left shoulder: Secondary | ICD-10-CM | POA: Diagnosis not present

## 2021-06-23 DIAGNOSIS — S42032D Displaced fracture of lateral end of left clavicle, subsequent encounter for fracture with routine healing: Secondary | ICD-10-CM

## 2021-06-23 NOTE — Progress Notes (Signed)
Orthopaedic Clinic Return  Assessment: Maria Ritter is a 75 y.o. female with the following: Left distal clavicle fracture; nonop in a sling  Plan: Patient had acute onset of pain in her left shoulder, after having her left elbow hit with a storm door.  Her pain is gradually improved, but she had concerns nonetheless.  Repeat radiographs are stable.  No concern for worsening of her injury.  Continue with the plan as previously discussed.  Gradual increase in activities.  We will see her back in approximately 3-4 weeks.  Follow-up: Return in about 4 weeks (around 07/21/2021).   Subjective:  Chief Complaint  Patient presents with   Fracture    Lt shoulder pain after door hit around her elbow DOI 06/20/21    History of Present Illness: Maria Ritter is a 75 y.o. female who returns to clinic for repeat evaluation of her left shoulder.  She was seen in clinic last week, and was doing well.  However, a storm door shot behind her, banging her elbow, injuring her shoulder.  She had increased pain in the left shoulder.  She was concerned as a result.  Since being hit by the door, her pain has improved.  She has remained out of the sling.  She returns to clinic today for a checkup, to ensure that she sustained no further injuries.    Review of Systems: No fevers or chills No numbness or tingling No chest pain No shortness of breath No bowel or bladder dysfunction No GI distress No headaches   Objective: Ht 5' 2.25" (1.581 m)   Wt 181 lb (82.1 kg)   BMI 32.84 kg/m   Physical Exam:  Alert and oriented, no acute distress  Left shoulder appears unchanged.  No bruising.  No additional tenderness to palpation.  She does have some bruising over the left elbow.  No active bleeding.  Sensation is intact distally.  IMAGING: I personally ordered and reviewed the following images:  X-ray of the left clavicle was obtained in clinic today, compared to previous x-rays.  There is been no  interval displacement of the fracture.  No acute injuries are noted.  Shoulder remains well reduced.  Impression: Healing left distal clavicle fracture in unchanged alignment, compared to previous x-rays  Mordecai Rasmussen, MD 06/23/2021 10:56 PM

## 2021-07-08 DIAGNOSIS — I10 Essential (primary) hypertension: Secondary | ICD-10-CM | POA: Diagnosis not present

## 2021-07-08 DIAGNOSIS — R7301 Impaired fasting glucose: Secondary | ICD-10-CM | POA: Diagnosis not present

## 2021-07-08 DIAGNOSIS — E039 Hypothyroidism, unspecified: Secondary | ICD-10-CM | POA: Diagnosis not present

## 2021-07-13 ENCOUNTER — Other Ambulatory Visit (HOSPITAL_COMMUNITY): Payer: Self-pay | Admitting: Internal Medicine

## 2021-07-13 DIAGNOSIS — K7689 Other specified diseases of liver: Secondary | ICD-10-CM | POA: Diagnosis not present

## 2021-07-13 DIAGNOSIS — M5136 Other intervertebral disc degeneration, lumbar region: Secondary | ICD-10-CM | POA: Diagnosis not present

## 2021-07-13 DIAGNOSIS — M179 Osteoarthritis of knee, unspecified: Secondary | ICD-10-CM | POA: Diagnosis not present

## 2021-07-13 DIAGNOSIS — I10 Essential (primary) hypertension: Secondary | ICD-10-CM | POA: Diagnosis not present

## 2021-07-13 DIAGNOSIS — Z853 Personal history of malignant neoplasm of breast: Secondary | ICD-10-CM | POA: Diagnosis not present

## 2021-07-13 DIAGNOSIS — Z0001 Encounter for general adult medical examination with abnormal findings: Secondary | ICD-10-CM | POA: Diagnosis not present

## 2021-07-13 DIAGNOSIS — E782 Mixed hyperlipidemia: Secondary | ICD-10-CM | POA: Diagnosis not present

## 2021-07-13 DIAGNOSIS — E039 Hypothyroidism, unspecified: Secondary | ICD-10-CM | POA: Diagnosis not present

## 2021-07-13 DIAGNOSIS — M8580 Other specified disorders of bone density and structure, unspecified site: Secondary | ICD-10-CM

## 2021-07-13 DIAGNOSIS — R7301 Impaired fasting glucose: Secondary | ICD-10-CM | POA: Diagnosis not present

## 2021-07-13 DIAGNOSIS — M25551 Pain in right hip: Secondary | ICD-10-CM | POA: Diagnosis not present

## 2021-07-14 ENCOUNTER — Ambulatory Visit (INDEPENDENT_AMBULATORY_CARE_PROVIDER_SITE_OTHER): Payer: Medicare HMO | Admitting: Orthopedic Surgery

## 2021-07-14 ENCOUNTER — Other Ambulatory Visit: Payer: Self-pay

## 2021-07-14 ENCOUNTER — Other Ambulatory Visit: Payer: Self-pay | Admitting: Orthopedic Surgery

## 2021-07-14 ENCOUNTER — Ambulatory Visit: Payer: Medicare HMO

## 2021-07-14 ENCOUNTER — Encounter: Payer: Self-pay | Admitting: Orthopedic Surgery

## 2021-07-14 DIAGNOSIS — S42032D Displaced fracture of lateral end of left clavicle, subsequent encounter for fracture with routine healing: Secondary | ICD-10-CM | POA: Diagnosis not present

## 2021-07-14 NOTE — Progress Notes (Signed)
[facility] Return  Assessment: Maria Ritter is a 75 y.o. female with the following: Left distal clavicle fracture; nonop in a sling  Plan: Patient has improved pain.  Radiographs are stable.  Injury was sustained greater than 2 months ago.  At this point, she is sufficiently healed to return to her activities as tolerated.  I encouraged her to return cautiously, to ensure that she does not rely on the left arm, and sustained another fall, or dropped something leading to another injury.  She stated understanding.  Follow-up as needed  Follow-up: No follow-ups on file.   Subjective:  Chief Complaint  Patient presents with   fractured clavicle left    Follow up/fracture care xrays    History of Present Illness: Maria Ritter is a 75 y.o. female who returns to clinic for repeat evaluation of her left shoulder.  She states that her pain continues to improve.  She has improved range of motion.  She has been very cautious with using the left arm.  She is no longer using a sling.  Since she was seen in clinic, she has had no further issues, or recent stumbles.  Review of Systems: No fevers or chills No numbness or tingling No chest pain No shortness of breath No bowel or bladder dysfunction No GI distress No headaches   Objective: There were no vitals taken for this visit.  Physical Exam:  Alert and oriented, no acute distress  Left shoulder appears unchanged.  No bruising.  No additional tenderness to palpation.  She does have some bruising over the left elbow.  No active bleeding.  Sensation is intact distally.  IMAGING: I personally ordered and reviewed the following images:  X-ray of the left clavicle was obtained in clinic today, compared to previous x-rays.  There is been no interval displacement of the distal clavicle fracture.  Fracture fragments remain in essentially the same position.  There is no obvious callus formation, but interval healing is  appreciated.  No acute injuries are noted.  Impression: Healing left distal clavicle fracture, in unchanged alignment.  Mordecai Rasmussen, MD 07/14/2021 11:29 AM

## 2021-07-20 DIAGNOSIS — Z85828 Personal history of other malignant neoplasm of skin: Secondary | ICD-10-CM | POA: Diagnosis not present

## 2021-07-20 DIAGNOSIS — L570 Actinic keratosis: Secondary | ICD-10-CM | POA: Diagnosis not present

## 2021-07-20 DIAGNOSIS — D0439 Carcinoma in situ of skin of other parts of face: Secondary | ICD-10-CM | POA: Diagnosis not present

## 2021-07-20 DIAGNOSIS — Z1283 Encounter for screening for malignant neoplasm of skin: Secondary | ICD-10-CM | POA: Diagnosis not present

## 2021-07-28 ENCOUNTER — Ambulatory Visit (HOSPITAL_COMMUNITY)
Admission: RE | Admit: 2021-07-28 | Discharge: 2021-07-28 | Disposition: A | Discharge status: Home / Self Care | Payer: Medicare HMO | Source: Ambulatory Visit | Attending: Internal Medicine | Admitting: Internal Medicine

## 2021-07-28 ENCOUNTER — Other Ambulatory Visit: Payer: Self-pay

## 2021-07-28 DIAGNOSIS — M85851 Other specified disorders of bone density and structure, right thigh: Secondary | ICD-10-CM | POA: Diagnosis not present

## 2021-07-28 DIAGNOSIS — Z13820 Encounter for screening for osteoporosis: Secondary | ICD-10-CM | POA: Insufficient documentation

## 2021-07-28 DIAGNOSIS — M8580 Other specified disorders of bone density and structure, unspecified site: Secondary | ICD-10-CM

## 2021-07-28 DIAGNOSIS — M810 Age-related osteoporosis without current pathological fracture: Secondary | ICD-10-CM | POA: Diagnosis not present

## 2021-07-28 DIAGNOSIS — Z853 Personal history of malignant neoplasm of breast: Secondary | ICD-10-CM | POA: Insufficient documentation

## 2021-07-28 DIAGNOSIS — Z780 Asymptomatic menopausal state: Secondary | ICD-10-CM | POA: Insufficient documentation

## 2021-07-29 DIAGNOSIS — E782 Mixed hyperlipidemia: Secondary | ICD-10-CM | POA: Diagnosis not present

## 2021-07-29 DIAGNOSIS — I10 Essential (primary) hypertension: Secondary | ICD-10-CM | POA: Diagnosis not present

## 2021-07-30 DIAGNOSIS — C44329 Squamous cell carcinoma of skin of other parts of face: Secondary | ICD-10-CM | POA: Diagnosis not present

## 2021-08-10 DIAGNOSIS — L4059 Other psoriatic arthropathy: Secondary | ICD-10-CM | POA: Diagnosis not present

## 2021-08-10 DIAGNOSIS — R220 Localized swelling, mass and lump, head: Secondary | ICD-10-CM | POA: Diagnosis not present

## 2021-08-10 DIAGNOSIS — R945 Abnormal results of liver function studies: Secondary | ICD-10-CM | POA: Diagnosis not present

## 2021-08-10 DIAGNOSIS — Z6830 Body mass index (BMI) 30.0-30.9, adult: Secondary | ICD-10-CM | POA: Diagnosis not present

## 2021-08-10 DIAGNOSIS — M8580 Other specified disorders of bone density and structure, unspecified site: Secondary | ICD-10-CM | POA: Diagnosis not present

## 2021-08-10 DIAGNOSIS — M25561 Pain in right knee: Secondary | ICD-10-CM | POA: Diagnosis not present

## 2021-08-10 DIAGNOSIS — M5030 Other cervical disc degeneration, unspecified cervical region: Secondary | ICD-10-CM | POA: Diagnosis not present

## 2021-08-10 DIAGNOSIS — E669 Obesity, unspecified: Secondary | ICD-10-CM | POA: Diagnosis not present

## 2021-08-10 DIAGNOSIS — M25551 Pain in right hip: Secondary | ICD-10-CM | POA: Diagnosis not present

## 2021-08-10 DIAGNOSIS — M5441 Lumbago with sciatica, right side: Secondary | ICD-10-CM | POA: Diagnosis not present

## 2021-08-11 DIAGNOSIS — I739 Peripheral vascular disease, unspecified: Secondary | ICD-10-CM | POA: Diagnosis not present

## 2021-08-14 DIAGNOSIS — K625 Hemorrhage of anus and rectum: Secondary | ICD-10-CM | POA: Diagnosis not present

## 2021-08-16 ENCOUNTER — Encounter (HOSPITAL_COMMUNITY): Payer: Self-pay | Admitting: *Deleted

## 2021-08-16 ENCOUNTER — Emergency Department (HOSPITAL_COMMUNITY)
Admission: EM | Admit: 2021-08-16 | Discharge: 2021-08-16 | Disposition: A | Discharge status: Home / Self Care | Payer: Medicare HMO | Attending: Emergency Medicine | Admitting: Emergency Medicine

## 2021-08-16 ENCOUNTER — Other Ambulatory Visit: Payer: Self-pay

## 2021-08-16 DIAGNOSIS — Z853 Personal history of malignant neoplasm of breast: Secondary | ICD-10-CM | POA: Insufficient documentation

## 2021-08-16 DIAGNOSIS — Z79899 Other long term (current) drug therapy: Secondary | ICD-10-CM | POA: Diagnosis not present

## 2021-08-16 DIAGNOSIS — I10 Essential (primary) hypertension: Secondary | ICD-10-CM | POA: Diagnosis not present

## 2021-08-16 DIAGNOSIS — R42 Dizziness and giddiness: Secondary | ICD-10-CM | POA: Diagnosis not present

## 2021-08-16 DIAGNOSIS — Z7982 Long term (current) use of aspirin: Secondary | ICD-10-CM | POA: Insufficient documentation

## 2021-08-16 DIAGNOSIS — E039 Hypothyroidism, unspecified: Secondary | ICD-10-CM | POA: Diagnosis not present

## 2021-08-16 DIAGNOSIS — K5900 Constipation, unspecified: Secondary | ICD-10-CM

## 2021-08-16 DIAGNOSIS — K625 Hemorrhage of anus and rectum: Secondary | ICD-10-CM | POA: Diagnosis not present

## 2021-08-16 LAB — COMPREHENSIVE METABOLIC PANEL
ALT: 27 U/L (ref 0–44)
AST: 39 U/L (ref 15–41)
Albumin: 4.2 g/dL (ref 3.5–5.0)
Alkaline Phosphatase: 76 U/L (ref 38–126)
Anion gap: 9 (ref 5–15)
BUN: 24 mg/dL — ABNORMAL HIGH (ref 8–23)
CO2: 28 mmol/L (ref 22–32)
Calcium: 9.6 mg/dL (ref 8.9–10.3)
Chloride: 101 mmol/L (ref 98–111)
Creatinine, Ser: 0.75 mg/dL (ref 0.44–1.00)
GFR, Estimated: >60 mL/min (ref >60)
Glucose, Bld: 114 mg/dL — ABNORMAL HIGH (ref 70–99)
Potassium: 4.2 mmol/L (ref 3.5–5.1)
Sodium: 138 mmol/L (ref 135–145)
Total Bilirubin: 0.7 mg/dL (ref 0.3–1.2)
Total Protein: 8.5 g/dL — ABNORMAL HIGH (ref 6.5–8.1)

## 2021-08-16 LAB — CBC WITH DIFFERENTIAL/PLATELET
Abs Immature Granulocytes: 0.02 10*3/uL (ref 0.00–0.07)
Basophils Absolute: 0.1 10*3/uL (ref 0.0–0.1)
Basophils Relative: 1 %
Eosinophils Absolute: 0.3 10*3/uL (ref 0.0–0.5)
Eosinophils Relative: 5 %
HCT: 42.3 % (ref 36.0–46.0)
Hemoglobin: 13.6 g/dL (ref 12.0–15.0)
Immature Granulocytes: 0 %
Lymphocytes Relative: 29 %
Lymphs Abs: 1.9 10*3/uL (ref 0.7–4.0)
MCH: 29.8 pg (ref 26.0–34.0)
MCHC: 32.2 g/dL (ref 30.0–36.0)
MCV: 92.6 fL (ref 80.0–100.0)
Monocytes Absolute: 0.7 10*3/uL (ref 0.1–1.0)
Monocytes Relative: 10 %
Neutro Abs: 3.8 10*3/uL (ref 1.7–7.7)
Neutrophils Relative %: 55 %
Platelets: 277 10*3/uL (ref 150–400)
RBC: 4.57 MIL/uL (ref 3.87–5.11)
RDW: 14.8 % (ref 11.5–15.5)
WBC: 6.8 10*3/uL (ref 4.0–10.5)
nRBC: 0 % (ref 0.0–0.2)

## 2021-08-16 LAB — PROTIME-INR
INR: 1.2 (ref 0.8–1.2)
Prothrombin Time: 14.7 seconds (ref 11.4–15.2)

## 2021-08-16 LAB — TYPE AND SCREEN
ABO/RH(D): B POS
Antibody Screen: NEGATIVE

## 2021-08-16 LAB — POC OCCULT BLOOD, ED: Fecal Occult Bld: POSITIVE — AB

## 2021-08-16 NOTE — ED Provider Notes (Signed)
[facility] EMERGENCY DEPARTMENT Provider Note   CSN: [id number] Arrival date & time: 08/16/21  0730     History Chief Complaint  Patient presents with   Rectal Bleeding    Maria Ritter is a 75 y.o. female.  She is here with a complaint of rectal bleeding and lower abdominal pain.  She said this started about 3 days ago.  She had been constipated and having to strain to have bowel movements.  She had some low crampy abdominal pain and when she wiped she had dark red blood on the toilet paper.  She continued to bleed for 1 day on and off.  Saw PCP yesterday and they did blood work and found her hemoglobin to be stable.  Reportedly had a hemorrhoid on physical exam.  Her abdominal pain has resolved and she had a bowel movement today without any blood but wanted to be evaluated.  Feeling fatigued.  No fevers or chills no nausea vomiting.  Not on any blood thinners.  No vaginal bleeding or hematuria.  The history is provided by the patient.  Rectal Bleeding Quality:  Maroon Amount:  Moderate Duration:  2 days Timing:  Intermittent Chronicity:  New Context: constipation, defecation and hemorrhoids   Relieved by:  None tried Worsened by:  Defecation Ineffective treatments:  None tried Associated symptoms: abdominal pain and light-headedness   Associated symptoms: no dizziness, no epistaxis, no fever, no hematemesis, no loss of consciousness and no vomiting   Risk factors: no anticoagulant use       Past Medical History:  Diagnosis Date   Breast cancer ([place]) 2010   Right Lumpectomy and XRT   Carotid artery disease ([place])    Mild to moderate bilateral ICA disease, left greater than right 07/2015   Essential hypertension    GERD (gastroesophageal reflux disease)    Glaucoma    Hyperlipidemia    Hypothyroidism    Osteoarthritis    Polymyalgia rheumatica ([facility])     Patient Active Problem List   Diagnosis Date Noted   Encounter for screening fecal occult blood testing  06/17/2021   Encounter for well woman exam with routine gynecological exam 06/17/2021   Abnormal stress test 08/02/2019   Postinflammatory skin changes 06/14/2019   Encounter for colorectal cancer screening 06/14/2019   Encounter for gynecological examination with Papanicolaou smear of cervix 06/14/2019   Lymph node enlargement    Flexural atopic dermatitis 01/31/2018   Itching 11/01/2017   Seasonal and perennial allergic rhinitis 11/01/2017   History of breast cancer in female 04/21/2016   Routine cervical smear 04/21/2016   Vaginal atrophy 04/21/2016   OVERWEIGHT 01/28/2010   LEG CRAMPS, NOCTURNAL 01/28/2010   ELECTROCARDIOGRAM, ABNORMAL 01/28/2010   CARCINOMA IN SITU, RIGHT BREAST 11/27/2009   HYPERLIPIDEMIA 11/27/2009   ARM PAIN, LEFT 11/27/2009   HYPOTHYROIDISM 11/26/2009   Essential hypertension 11/26/2009    Past Surgical History:  Procedure Laterality Date   AXILLARY LYMPH NODE BIOPSY Right 05/21/2019   Procedure: RIGHT AXILLARY LYMPH NODE BIOPSY;  Surgeon: Aviva Signs, MD;  Location: [facility];  Service: General;  Laterality: Right;   BASAL CELL CARCINOMA EXCISION  2010   Right breast   BREAST LUMPECTOMY Right 2010   ENDOMETRIAL ABLATION  2002   FINGER SURGERY     Minor surgical intervention following a crush injury to the left fourth finger   TOE SURGERY Right 07/30/2020   2nd toe on right foot   TOOTH EXTRACTION  01/2019     OB History  Gravida  0   Para  0   Term  0   Preterm  0   AB  0   Living  0      SAB  0   IAB  0   Ectopic  0   Multiple  0   Live Births  0           Family History  Problem Relation Age of Onset   Pneumonia Mother    Parkinsonism Mother    Heart failure Father     Social History   Tobacco Use   Smoking status: Never   Smokeless tobacco: Never  Vaping Use   Vaping Use: Never used  Substance Use Topics   Alcohol use: No    Alcohol/week: 0.0 standard drinks   Drug use: No    Home  Medications Prior to Admission medications   Medication Sig Start Date End Date Taking? Authorizing Provider  Acetaminophen (ACETAMINOPHEN EXTRA STRENGTH) 500 MG capsule Take 1,000 mg by mouth every 6 (six) hours as needed.    [provider]  acetaminophen (TYLENOL) 650 MG CR tablet Take 1,300 mg by mouth every morning.    [provider]  Ascorbic Acid (VITAMIN C) 1000 MG tablet Take 1,000 mg by mouth daily.    [provider]  aspirin EC 81 MG tablet Take 81 mg by mouth daily. Swallow whole.    [provider]  CALCIUM CITRATE-VITAMIN D PO Take 1 tablet by mouth daily.     [provider]  Cholecalciferol (VITAMIN D3 PO) Take 2,000 Units by mouth 2 (two) times daily.    [provider]  clobetasol ointment (TEMOVATE) 0.48 % Apply 1 application topically 2 (two) times daily. Do not use longer than two weeks at a time. Patient taking differently: Apply 1 application topically as needed. Do not use longer than two weeks at a time. 04/15/21   [name], MD  famotidine (PEPCID) 20 MG tablet Take 1 tablet (20 mg total) by mouth 2 (two) times daily as needed for heartburn or indigestion. Patient taking differently: Take 20 mg by mouth daily. 04/15/21   [name], MD  fluticasone [facility]) 50 MCG/ACT nasal spray Place into both nostrils daily.    [provider]  furosemide (LASIX) 40 MG tablet Take 40 mg by mouth.    [provider]  GLUCOSAMINE-CHONDROITIN PO Take 1 tablet by mouth 2 (two) times daily.     [provider]  hydrocortisone 2.5 % cream Apply topically as needed.    [provider]  hydrOXYzine (VISTARIL) 25 MG capsule Take 25 mg by mouth every 6 (six) hours as needed. 01/02/20   [provider]  ketoconazole (NIZORAL) 2 % cream Apply 1 application topically daily.    [provider]  ketoconazole 2%-triamcinolone 0.1% 1:2 cream mixture Apply topically.     [provider]  levothyroxine (SYNTHROID) 150 MCG tablet Take 150 mcg by mouth daily. 04/15/20   [provider]  lisinopril (ZESTRIL) 20 MG tablet Take 20 mg by mouth daily.    [provider]  Magnesium 250 MG TABS Take 250 mg by mouth daily.     [provider]  meloxicam (MOBIC) 7.5 MG tablet Take 7.5 mg by mouth daily.    [provider]  methocarbamol (ROBAXIN) 500 MG tablet Take 500 mg by mouth 3 (three) times daily as needed for muscle spasms.  04/04/19   [provider]  metroNIDAZOLE (  METROGEL) 0.75 % gel Apply 1 application topically as needed.    [provider]  montelukast (SINGULAIR) 10 MG tablet Take 1 tablet (10 mg total) by mouth at bedtime. 04/15/21   [name], MD  Omega-3 1000 MG CAPS Take 1,000 mg by mouth 2 (two) times daily.    [provider]  Polyethyl Glycol-Propyl Glycol (SYSTANE OP) Apply to eye.    [provider]  potassium chloride SA (KLOR-CON) 20 MEQ tablet Take 20 mEq by mouth daily.    [provider]  rosuvastatin (CRESTOR) 10 MG tablet Take 10 mg by mouth daily. 04/24/20   [provider]  silver sulfADIAZINE (SILVADENE) 1 % cream Apply 1 application topically daily. 06/17/21   Estill Dooms, NP  triamcinolone ointment (KENALOG) 0.1 % Apply 1 application topically 2 (two) times daily. Patient taking differently: Apply 1 application topically as needed. 04/15/21   [name], MD  Turmeric 500 MG CAPS Take 500 mg by mouth daily.    [provider]  vitamin B-12 (CYANOCOBALAMIN) 1000 MCG tablet Take 500 mcg by mouth daily.    [provider]  zinc gluconate 50 MG tablet Take 50 mg by mouth daily.    [provider]    Allergies    Mango flavor, Diclofenac sodium, Erythromycin, Ivp dye [iodinated diagnostic agents], Penicillamine, Penicillins, Red dye, Sulfonamide derivatives, Neosporin [bacitracin-polymyxin b],  and Tape  Review of Systems   Review of Systems  Constitutional:  Negative for fever.  HENT:  Negative for nosebleeds and sore throat.   Eyes:  Negative for visual disturbance.  Respiratory:  Negative for shortness of breath.   Cardiovascular:  Negative for chest pain.  Gastrointestinal:  Positive for abdominal pain, anal bleeding, constipation and hematochezia. Negative for hematemesis and vomiting.  Genitourinary:  Negative for dysuria.  Musculoskeletal:  Negative for neck pain.  Skin:  Negative for rash.  Neurological:  Positive for light-headedness. Negative for dizziness and loss of consciousness.   Physical Exam Updated Vital Signs BP (!) 171/85 (BP Location: Right Arm)    Pulse 85    Temp 98.2 F (36.8 C) (Oral)    Resp 19    Ht 5\' 2"  (1.575 m)    Wt 81.6 kg    SpO2 96%    BMI 32.92 kg/m   Physical Exam Vitals and nursing note reviewed.  Constitutional:      General: She is not in acute distress.    Appearance: Normal appearance. She is well-developed.  HENT:     Head: Normocephalic and atraumatic.  Eyes:     Conjunctiva/sclera: Conjunctivae normal.  Cardiovascular:     Rate and Rhythm: Normal rate and regular rhythm.     Heart sounds: No murmur heard. Pulmonary:     Effort: Pulmonary effort is normal. No respiratory distress.     Breath sounds: Normal breath sounds.  Abdominal:     Palpations: Abdomen is soft.     Tenderness: There is no abdominal tenderness. There is no guarding or rebound.  Musculoskeletal:        General: No swelling.     Cervical back: Neck supple.     Right lower leg: No edema.     Left lower leg: No edema.  Skin:    General: Skin is warm and dry.     Capillary Refill: Capillary refill takes less than 2 seconds.  Neurological:     General: No focal deficit present.     Mental Status:  She is alert.     Gait: Gait normal.    ED Results / Procedures / Treatments   Labs (all labs ordered are listed, but only abnormal results are  displayed) Labs Reviewed  COMPREHENSIVE METABOLIC PANEL - Abnormal; Notable for the following components:      Result Value   Glucose, Bld 114 (*)    BUN 24 (*)    Total Protein 8.5 (*)    All other components within normal limits  POC OCCULT BLOOD, ED - Abnormal; Notable for the following components:   Fecal Occult Bld POSITIVE (*)    All other components within normal limits  CBC WITH DIFFERENTIAL/PLATELET  PROTIME-INR  TYPE AND SCREEN    EKG None  Radiology No results found.  Procedures Procedures   Medications Ordered in ED Medications - No data to display  ED Course  I have reviewed the triage vital signs and the nursing notes.  Pertinent labs & imaging results that were available during my care of the patient were reviewed by me and considered in my medical decision making (see chart for details).  Clinical Course as of 08/16/21 1714  Sun Aug 16, 2021  0812 Rectal exam done nurse Vaughan Basta as chaperone.  Small nontender hemorrhoid at anal verge, hard stool in vault.  Sample sent to lab for guaiac. [MB]  [id number] Patient's labs showing stable hemoglobin.  She is trace heme positive from below.  Reviewed with patient.  Recommended outpatient follow-up with GI.  She said she called Dr. Roseanne Kaufman office and was told she cannot get an appointment for months. [MB]    Clinical Course User Index [MB] Hayden Rasmussen, MD   MDM Rules/Calculators/A&P                         This patient complains of rectal bleeding abdominal pain; this involves an extensive number of treatment Options and is a complaint that carries with it a high risk of complications and Morbidity. The differential includes hemorrhoids, diverticulosis, diverticulitis, polyp, upper GI bleed  I ordered, reviewed and interpreted labs, which included CBC with normal white count normal hemoglobin, chemistries mildly elevated BUN normal creatinine, fecal occult positive  Previous records obtained and reviewed in epic  no recent admissions  After the interventions stated above, I reevaluated the patient and found patient have a benign abdominal exam and stable vitals.  Does not appear to be brisk bleeding.  Not on anticoagulation.  Made recommendation that she follow-up with GI as outpatient, increase fluid and fiber intake, stool softener.  Return instructions discussed     Final Clinical Impression(s) / ED Diagnoses Final diagnoses:  Rectal bleeding  Constipation, unspecified constipation type    Rx / DC Orders ED Discharge Orders     None        Hayden Rasmussen, MD 08/16/21 [phone]

## 2021-08-16 NOTE — ED Triage Notes (Signed)
Pt presents to ED via POC with c/o rectal bleeding that started Thursday night. Pt saw her PCP on Saturday morning and they found she had external and internal hemorrhoids. They were concerned she may something else causing the bleeding and told her if it continued to come to the ED.

## 2021-08-16 NOTE — Discharge Instructions (Signed)
You were seen in the emergency department for rectal bleeding.  You had blood work done that showed your blood count to be stable.  Please increase your water intake and fiber such as MiraLAX.  Avoid straining on the toilet.  Continue your stool softener.  Contact GI as you will likely need a sigmoidoscopy or colonoscopy.  Return if heavy bleeding or worsening symptoms.

## 2021-09-03 ENCOUNTER — Encounter (INDEPENDENT_AMBULATORY_CARE_PROVIDER_SITE_OTHER): Payer: Self-pay | Admitting: Gastroenterology

## 2021-09-03 ENCOUNTER — Telehealth (INDEPENDENT_AMBULATORY_CARE_PROVIDER_SITE_OTHER): Payer: Self-pay

## 2021-09-03 ENCOUNTER — Ambulatory Visit (INDEPENDENT_AMBULATORY_CARE_PROVIDER_SITE_OTHER): Payer: Medicare HMO | Admitting: Gastroenterology

## 2021-09-03 ENCOUNTER — Other Ambulatory Visit: Payer: Self-pay

## 2021-09-03 ENCOUNTER — Encounter (INDEPENDENT_AMBULATORY_CARE_PROVIDER_SITE_OTHER): Payer: Self-pay

## 2021-09-03 VITALS — BP 154/72 | HR 62 | Temp 99.2°F | Ht 62.0 in | Wt 176.7 lb

## 2021-09-03 DIAGNOSIS — K625 Hemorrhage of anus and rectum: Secondary | ICD-10-CM

## 2021-09-03 DIAGNOSIS — K649 Unspecified hemorrhoids: Secondary | ICD-10-CM

## 2021-09-03 DIAGNOSIS — K5900 Constipation, unspecified: Secondary | ICD-10-CM

## 2021-09-03 MED ORDER — HYDROCORTISONE (PERIANAL) 2.5 % EX CREA: 1.0000 "application " | TOPICAL_CREAM | Freq: Two times a day (BID) | CUTANEOUS | 2 refills | Status: DC

## 2021-09-03 MED ORDER — PEG 3350-KCL-NA BICARB-NACL 420 G PO SOLR: 4000.0000 mL | ORAL | 0 refills | Status: DC

## 2021-09-03 NOTE — Progress Notes (Signed)
Referring Provider: Celene Squibb, MD Primary Care Physician:  Celene Squibb, MD Primary GI Physician: newly established  Chief Complaint  Patient presents with   Follow-up    Patient here today for a Ed follow up from 08/16/2021 due to rectal bleeding. She has not seen any dark or bloody stools. She states she thinks she was constipated at the time and has since started eating more fiber. She states the constipation is some what better and is having more loose stools. She states she stays tired, but denies any shortness of breath or dizziness.   HPI:   Maria Ritter is a 76 y.o. female with past medical history of breast cancer, CAD, HTN, GERD, glaucoma, HLD, hypothyroidism, OA, Polymyalgia rheumatica.  Patient presenting today as a new patient for ED follow up of rectal bleeding.   Patient presented to the ED on 08/16/21 with c/o rectal bleeding and lower abdominal pain that started 3 days prior, she reported constipation and the need to strain to defecate at that time. She reported abdominal pain was crampy in nature and saw dark red blood on paper when wiping though she had some rectal bleeding off and on for the next day. She was noted to have hemorrhoids at a recent physical exam with PCP. She also was found to have small nontender hemorrhoid at anal verge and hard stool in rectal vault during ED visit.   Hgb 13.6 08/16/21, FOBT positive at that time, though there was also presence of hemorrhoids, WBC and BUN also WNL.  Today, patient states that prior to her ED visit, she had eaten dinner and started noticing some abdominal cramping and dizziness, she reports that she went to the restroom and felt some better, but still decided to go to bed, she then woke up at 430 am feeling sick, she went to the restroom to have a BM and had rectal bleeding that she noticed in the toilet, she notes that she had to strain quite a bit prior to the rectal bleeding occurring, she states that she bled off  and on throughout the day as she put a pad in her underwear and noticed some on that and anytime she wiped herself in the restroom she would notice some BRB/pinkish blood, she called her PCP and was told she should go to the ED. She states that she waited until the next day to see if the bleeding stopped then she proceeded to the ED the next day for further evaluation. She was told in the ED that symptoms were likely related to constipation/presence of hemorrhoid. She states that she changed her diet to help with the constipation which seems to have made a difference as her stools are not as hard. She has had no further rectal bleeding, and is having 1 BM per day now. She reports that she sometimes still has to strain a little to pass stool but nothing like before.   NSAID use:81mg  ASA daily, no other NSAIDs Social hx: no etoh or tobacco Fam hx: patient's paternal grandmother had CRC, diagnosed in late 12s  Last Colonoscopy:2013 normal per patient (done in [place]), had negative cologuard maybe 1 year ago. Last Endoscopy:never  Past Medical History:  Diagnosis Date   Breast cancer ([place]) 2010   Right Lumpectomy and XRT   Carotid artery disease ([facility])    Mild to moderate bilateral ICA disease, left greater than right 07/2015   Essential hypertension    GERD (gastroesophageal reflux disease)  Glaucoma    Hyperlipidemia    Hypothyroidism    Osteoarthritis    Polymyalgia rheumatica ([facility])     Past Surgical History:  Procedure Laterality Date   AXILLARY LYMPH NODE BIOPSY Right 05/21/2019   Procedure: RIGHT AXILLARY LYMPH NODE BIOPSY;  Surgeon: Aviva Signs, MD;  Location: [facility];  Service: General;  Laterality: Right;   BASAL CELL CARCINOMA EXCISION  2010   Right breast   BREAST LUMPECTOMY Right 2010   ENDOMETRIAL ABLATION  2002   FINGER SURGERY     Minor surgical intervention following a crush injury to the left fourth finger   TOE SURGERY Right 07/30/2020   2nd toe on right foot    TOOTH EXTRACTION  01/2019    Current Outpatient Medications  Medication Sig Dispense Refill   Acetaminophen (ACETAMINOPHEN EXTRA STRENGTH) 500 MG capsule Take 1,000 mg by mouth every 6 (six) hours as needed.     acetaminophen (TYLENOL) 650 MG CR tablet Take 1,300 mg by mouth every morning.     Ascorbic Acid (VITAMIN C) 1000 MG tablet Take 1,000 mg by mouth daily.     aspirin EC 81 MG tablet Take 81 mg by mouth daily. Swallow whole.     CALCIUM CITRATE-VITAMIN D PO Take 1 tablet by mouth daily.      Cholecalciferol (VITAMIN D3 PO) Take 2,000 Units by mouth 2 (two) times daily.     clobetasol ointment (TEMOVATE) 4.01 % Apply 1 application topically 2 (two) times daily. Do not use longer than two weeks at a time. (Patient taking differently: Apply 1 application topically as needed. Do not use longer than two weeks at a time.) 30 g 2   famotidine (PEPCID) 20 MG tablet Take 1 tablet (20 mg total) by mouth 2 (two) times daily as needed for heartburn or indigestion. (Patient taking differently: Take 20 mg by mouth daily.) 60 tablet 5   fluticasone (FLONASE) 50 MCG/ACT nasal spray Place into both nostrils daily.     folic acid (FOLVITE) 1 MG tablet Take 1 mg by mouth daily.     GLUCOSAMINE-CHONDROITIN PO Take 1 tablet by mouth 2 (two) times daily.      hydrocortisone 2.5 % cream Apply topically as needed.     hydrOXYzine (VISTARIL) 25 MG capsule Take 25 mg by mouth every 6 (six) hours as needed.     ketoconazole (NIZORAL) 2 % cream Apply 1 application topically daily.     ketoconazole 2%-triamcinolone 0.1% 1:2 cream mixture Apply topically.     levothyroxine (SYNTHROID) 150 MCG tablet Take 150 mcg by mouth daily.     lisinopril (ZESTRIL) 20 MG tablet Take 20 mg by mouth daily.     Magnesium 250 MG TABS Take 250 mg by mouth daily.      meloxicam (MOBIC) 7.5 MG tablet Take 7.5 mg by mouth daily.     methocarbamol (ROBAXIN) 500 MG tablet Take 500 mg by mouth 3 (three) times daily as needed for muscle  spasms.      methotrexate (RHEUMATREX) 2.5 MG tablet Take 10 mg by mouth once a week. Caution:Chemotherapy. Protect from light.     metroNIDAZOLE (METROGEL) 0.75 % gel Apply 1 application topically as needed.     montelukast (SINGULAIR) 10 MG tablet Take 1 tablet (10 mg total) by mouth at bedtime. 30 tablet 5   Omega-3 1000 MG CAPS Take 1,000 mg by mouth 2 (two) times daily.     Polyethyl Glycol-Propyl Glycol (SYSTANE OP) Apply to eye.  rosuvastatin (CRESTOR) 10 MG tablet Take 10 mg by mouth daily.     silver sulfADIAZINE (SILVADENE) 1 % cream Apply 1 application topically daily. 25 g 1   triamcinolone ointment (KENALOG) 0.1 % Apply 1 application topically 2 (two) times daily. (Patient taking differently: Apply 1 application topically as needed.) 454 g 1   Turmeric 500 MG CAPS Take 500 mg by mouth daily.     vitamin B-12 (CYANOCOBALAMIN) 1000 MCG tablet Take 500 mcg by mouth daily.     zinc gluconate 50 MG tablet Take 50 mg by mouth daily.     furosemide (LASIX) 40 MG tablet Take 40 mg by mouth. (Patient not taking: Reported on 09/03/2021)     potassium chloride SA (KLOR-CON) 20 MEQ tablet Take 20 mEq by mouth daily. (Patient not taking: Reported on 09/03/2021)     No current facility-administered medications for this visit.    Allergies as of 09/03/2021 - Review Complete 09/03/2021  Allergen Reaction Noted   Mango flavor Hives 03/26/2015   Diclofenac sodium Other (See Comments) 04/01/2021   Erythromycin Nausea Only 11/26/2009   Ivp dye [iodinated contrast media]  04/08/2016   Penicillamine Other (See Comments) 02/24/2021   Penicillins Itching 11/26/2009   Red dye Other (See Comments) 02/24/2021   Sulfonamide derivatives Itching 11/26/2009   Neosporin [bacitracin-polymyxin b] Itching 04/24/2019   Tape Hives and Rash 06/14/2019    Family History  Problem Relation Age of Onset   Pneumonia Mother    Parkinsonism Mother    Heart failure Father     Social History   Socioeconomic  History   Marital status: Widowed    Spouse name: Not on file   Number of children: Not on file   Years of education: Not on file   Highest education level: Not on file  Occupational History   Occupation: Retired from [employer]    Employer: RETIRED  Tobacco Use   Smoking status: Never   Smokeless tobacco: Never  Vaping Use   Vaping Use: Never used  Substance and Sexual Activity   Alcohol use: No    Alcohol/week: 0.0 standard drinks   Drug use: No   Sexual activity: Not Currently    Birth control/protection: Post-menopausal    Comment: ablation  Other Topics Concern   Not on file  Social History Narrative   Widowed with no children   Social Determinants of Health   Financial Resource Strain: Low Risk    Difficulty of Paying Living Expenses: Not hard at all  Food Insecurity: No Food Insecurity   Worried About Charity fundraiser in the Last Year: Never true   Surprise in the Last Year: Never true  Transportation Needs: No Transportation Needs   Lack of Transportation (Medical): No   Lack of Transportation (Non-Medical): No  Physical Activity: Inactive   Days of Exercise per Week: 0 days   Minutes of Exercise per Session: 0 min  Stress: No Stress Concern Present   Feeling of Stress : Not at all  Social Connections: Moderately Integrated   Frequency of Communication with Friends and Family: More than three times a week   Frequency of Social Gatherings with Friends and Family: More than three times a week   Attends Religious Services: More than 4 times per year   Active Member of [employer] or Organizations: Yes   Attends Archivist Meetings: 1 to 4 times per year   Marital Status: Widowed   Review of systems General:  negative for malaise, night sweats, fever, chills, weight loss Neck: Negative for lumps, goiter, pain and significant neck swelling Resp: Negative for cough, wheezing, dyspnea at rest CV: Negative for chest pain, leg swelling, palpitations,  orthopnea GI: denies melena, hematochezia, nausea, vomiting, diarrhea, dysphagia, odyonophagia, early satiety or unintentional weight loss. +constipation MSK: Negative for joint pain or swelling, back pain, and muscle pain. Derm: Negative for itching or rash Psych: Denies depression, anxiety, memory loss, confusion. No homicidal or suicidal ideation.  Heme: Negative for prolonged bleeding, bruising easily, and swollen nodes. Endocrine: Negative for cold or heat intolerance, polyuria, polydipsia and goiter. Neuro: negative for tremor, gait imbalance, syncope and seizures. The remainder of the review of systems is noncontributory.  Physical Exam: BP (!) 154/72 (BP Location: Left Arm, Patient Position: Sitting, Cuff Size: Small)    Pulse 62    Temp 99.2 F (37.3 C) (Oral)    Ht 5\' 2"  (1.575 m)    Wt 176 lb 11.2 oz (80.2 kg)    BMI 32.32 kg/m  General:   Alert and oriented. No distress noted. Pleasant and cooperative.  Head:  Normocephalic and atraumatic. Eyes:  Conjuctiva clear without scleral icterus. Mouth:  Oral mucosa pink and moist. Good dentition. No lesions. Heart: Normal rate and rhythm, s1 and s2 heart sounds present.  Lungs: Clear lung sounds in all lobes. Respirations equal and unlabored. Abdomen:  +BS, soft, non-tender and non-distended. No rebound or guarding. No HSM or masses noted. Derm: No palmar erythema or jaundice Msk:  Symmetrical without gross deformities. Normal posture. Extremities:  Without edema. Neurologic:  Alert and  oriented x4 Psych:  Alert and cooperative. Normal mood and affect.  Invalid input(s): 6 MONTHS   ASSESSMENT: PIERRA SKORA is a 76 y.o. female presenting today as a new patient for ED follow up visit for rectal bleeding.  Given presence of hemorrhoids and hard stool in rectal vault, I suspect patient's rectal bleeding was related to hemorrhoidal bleeding in the presence of significant constipation. Reassuringly she has presented with no further  rectal bleeding since changing her diet and having softer stools. We discussed starting miralax to help prevent future episodes of constipation and to help avoid straining. I am providing Rx for anusol cream for her hemorrhoids. I discussed with her that we could do watchful waiting and proceed with colonoscopy if further rectal bleeding or other red flag symptoms occurred or we could go ahead and proceed with colonoscopy. Indications, risks and benefits of procedure discussed in detail with patient. Patient verbalized understanding and wishes to proceed with Colonoscopy at this time. She will continue to do a diet high in fruits, veggies/fiber and whole grains as well as add miralax for additional constipation treatment. She should drink plenty of water to stay well hydrated as this will also help stools to remain softer. She will make me aware of any further episodes of rectal bleeding.    PLAN:  Anusol cream BID x7 days, PRN thereafter 2. Miralax (17g/day, then increase to 17g BID if no improvement after 1 week, can increase to 17g TID if no improvement after 2 weeks). 3. Schedule colonsocopy 4. Continue with high fiber/fruits/veggies and whole grains in diet as well as plenty of water  Follow up: TBD after Colonoscopy  Kimberlea Schlag L. Alver Sorrow, MSN, APRN, AGNP-C Adult-Gerontology Nurse Practitioner [facility]

## 2021-09-03 NOTE — H&P (View-Only) (Signed)
Referring Provider: Celene Squibb, MD Primary Care Physician:  Celene Squibb, MD Primary GI Physician: newly established  Chief Complaint  Patient presents with   Follow-up    Patient here today for a Ed follow up from 08/16/2021 due to rectal bleeding. She has not seen any dark or bloody stools. She states she thinks she was constipated at the time and has since started eating more fiber. She states the constipation is some what better and is having more loose stools. She states she stays tired, but denies any shortness of breath or dizziness.   HPI:   Maria Ritter is a 76 y.o. female with past medical history of breast cancer, CAD, HTN, GERD, glaucoma, HLD, hypothyroidism, OA, Polymyalgia rheumatica.  Patient presenting today as a new patient for ED follow up of rectal bleeding.   Patient presented to the ED on 08/16/21 with c/o rectal bleeding and lower abdominal pain that started 3 days prior, she reported constipation and the need to strain to defecate at that time. She reported abdominal pain was crampy in nature and saw dark red blood on paper when wiping though she had some rectal bleeding off and on for the next day. She was noted to have hemorrhoids at a recent physical exam with PCP. She also was found to have small nontender hemorrhoid at anal verge and hard stool in rectal vault during ED visit.   Hgb 13.6 08/16/21, FOBT positive at that time, though there was also presence of hemorrhoids, WBC and BUN also WNL.  Today, patient states that prior to her ED visit, she had eaten dinner and started noticing some abdominal cramping and dizziness, she reports that she went to the restroom and felt some better, but still decided to go to bed, she then woke up at 430 am feeling sick, she went to the restroom to have a BM and had rectal bleeding that she noticed in the toilet, she notes that she had to strain quite a bit prior to the rectal bleeding occurring, she states that she bled off  and on throughout the day as she put a pad in her underwear and noticed some on that and anytime she wiped herself in the restroom she would notice some BRB/pinkish blood, she called her PCP and was told she should go to the ED. She states that she waited until the next day to see if the bleeding stopped then she proceeded to the ED the next day for further evaluation. She was told in the ED that symptoms were likely related to constipation/presence of hemorrhoid. She states that she changed her diet to help with the constipation which seems to have made a difference as her stools are not as hard. She has had no further rectal bleeding, and is having 1 BM per day now. She reports that she sometimes still has to strain a little to pass stool but nothing like before.   NSAID use:81mg  ASA daily, no other NSAIDs Social hx: no etoh or tobacco Fam hx: patient's paternal grandmother had CRC, diagnosed in late 23s  Last Colonoscopy:2013 normal per patient (done in [place]), had negative cologuard maybe 1 year ago. Last Endoscopy:never  Past Medical History:  Diagnosis Date   Breast cancer ([place]) 2010   Right Lumpectomy and XRT   Carotid artery disease (HCC)    Mild to moderate bilateral ICA disease, left greater than right 07/2015   Essential hypertension    GERD (gastroesophageal reflux disease)  Glaucoma    Hyperlipidemia    Hypothyroidism    Osteoarthritis    Polymyalgia rheumatica ([place])     Past Surgical History:  Procedure Laterality Date   AXILLARY LYMPH NODE BIOPSY Right 05/21/2019   Procedure: RIGHT AXILLARY LYMPH NODE BIOPSY;  Surgeon: Aviva Signs, MD;  Location: [facility];  Service: General;  Laterality: Right;   BASAL CELL CARCINOMA EXCISION  2010   Right breast   BREAST LUMPECTOMY Right 2010   ENDOMETRIAL ABLATION  2002   FINGER SURGERY     Minor surgical intervention following a crush injury to the left fourth finger   TOE SURGERY Right 07/30/2020   2nd toe on right foot    TOOTH EXTRACTION  01/2019    Current Outpatient Medications  Medication Sig Dispense Refill   Acetaminophen (ACETAMINOPHEN EXTRA STRENGTH) 500 MG capsule Take 1,000 mg by mouth every 6 (six) hours as needed.     acetaminophen (TYLENOL) 650 MG CR tablet Take 1,300 mg by mouth every morning.     Ascorbic Acid (VITAMIN C) 1000 MG tablet Take 1,000 mg by mouth daily.     aspirin EC 81 MG tablet Take 81 mg by mouth daily. Swallow whole.     CALCIUM CITRATE-VITAMIN D PO Take 1 tablet by mouth daily.      Cholecalciferol (VITAMIN D3 PO) Take 2,000 Units by mouth 2 (two) times daily.     clobetasol ointment (TEMOVATE) 5.02 % Apply 1 application topically 2 (two) times daily. Do not use longer than two weeks at a time. (Patient taking differently: Apply 1 application topically as needed. Do not use longer than two weeks at a time.) 30 g 2   famotidine (PEPCID) 20 MG tablet Take 1 tablet (20 mg total) by mouth 2 (two) times daily as needed for heartburn or indigestion. (Patient taking differently: Take 20 mg by mouth daily.) 60 tablet 5   fluticasone (FLONASE) 50 MCG/ACT nasal spray Place into both nostrils daily.     folic acid (FOLVITE) 1 MG tablet Take 1 mg by mouth daily.     GLUCOSAMINE-CHONDROITIN PO Take 1 tablet by mouth 2 (two) times daily.      hydrocortisone 2.5 % cream Apply topically as needed.     hydrOXYzine (VISTARIL) 25 MG capsule Take 25 mg by mouth every 6 (six) hours as needed.     ketoconazole (NIZORAL) 2 % cream Apply 1 application topically daily.     ketoconazole 2%-triamcinolone 0.1% 1:2 cream mixture Apply topically.     levothyroxine (SYNTHROID) 150 MCG tablet Take 150 mcg by mouth daily.     lisinopril (ZESTRIL) 20 MG tablet Take 20 mg by mouth daily.     Magnesium 250 MG TABS Take 250 mg by mouth daily.      meloxicam (MOBIC) 7.5 MG tablet Take 7.5 mg by mouth daily.     methocarbamol (ROBAXIN) 500 MG tablet Take 500 mg by mouth 3 (three) times daily as needed for muscle  spasms.      methotrexate (RHEUMATREX) 2.5 MG tablet Take 10 mg by mouth once a week. Caution:Chemotherapy. Protect from light.     metroNIDAZOLE (METROGEL) 0.75 % gel Apply 1 application topically as needed.     montelukast (SINGULAIR) 10 MG tablet Take 1 tablet (10 mg total) by mouth at bedtime. 30 tablet 5   Omega-3 1000 MG CAPS Take 1,000 mg by mouth 2 (two) times daily.     Polyethyl Glycol-Propyl Glycol (SYSTANE OP) Apply to eye.  rosuvastatin (CRESTOR) 10 MG tablet Take 10 mg by mouth daily.     silver sulfADIAZINE (SILVADENE) 1 % cream Apply 1 application topically daily. 25 g 1   triamcinolone ointment (KENALOG) 0.1 % Apply 1 application topically 2 (two) times daily. (Patient taking differently: Apply 1 application topically as needed.) 454 g 1   Turmeric 500 MG CAPS Take 500 mg by mouth daily.     vitamin B-12 (CYANOCOBALAMIN) 1000 MCG tablet Take 500 mcg by mouth daily.     zinc gluconate 50 MG tablet Take 50 mg by mouth daily.     furosemide (LASIX) 40 MG tablet Take 40 mg by mouth. (Patient not taking: Reported on 09/03/2021)     potassium chloride SA (KLOR-CON) 20 MEQ tablet Take 20 mEq by mouth daily. (Patient not taking: Reported on 09/03/2021)     No current facility-administered medications for this visit.    Allergies as of 09/03/2021 - Review Complete 09/03/2021  Allergen Reaction Noted   Mango flavor Hives 03/26/2015   Diclofenac sodium Other (See Comments) 04/01/2021   Erythromycin Nausea Only 11/26/2009   Ivp dye [iodinated contrast media]  04/08/2016   Penicillamine Other (See Comments) 02/24/2021   Penicillins Itching 11/26/2009   Red dye Other (See Comments) 02/24/2021   Sulfonamide derivatives Itching 11/26/2009   Neosporin [bacitracin-polymyxin b] Itching 04/24/2019   Tape Hives and Rash 06/14/2019    Family History  Problem Relation Age of Onset   Pneumonia Mother    Parkinsonism Mother    Heart failure Father     Social History   Socioeconomic  History   Marital status: Widowed    Spouse name: Not on file   Number of children: Not on file   Years of education: Not on file   Highest education level: Not on file  Occupational History   Occupation: Retired from [employer]    Employer: RETIRED  Tobacco Use   Smoking status: Never   Smokeless tobacco: Never  Vaping Use   Vaping Use: Never used  Substance and Sexual Activity   Alcohol use: No    Alcohol/week: 0.0 standard drinks   Drug use: No   Sexual activity: Not Currently    Birth control/protection: Post-menopausal    Comment: ablation  Other Topics Concern   Not on file  Social History Narrative   Widowed with no children   Social Determinants of Health   Financial Resource Strain: Low Risk    Difficulty of Paying Living Expenses: Not hard at all  Food Insecurity: No Food Insecurity   Worried About Charity fundraiser in the Last Year: Never true   [place] in the Last Year: Never true  Transportation Needs: No Transportation Needs   Lack of Transportation (Medical): No   Lack of Transportation (Non-Medical): No  Physical Activity: Inactive   Days of Exercise per Week: 0 days   Minutes of Exercise per Session: 0 min  Stress: No Stress Concern Present   Feeling of Stress : Not at all  Social Connections: Moderately Integrated   Frequency of Communication with Friends and Family: More than three times a week   Frequency of Social Gatherings with Friends and Family: More than three times a week   Attends Religious Services: More than 4 times per year   Active Member of [employer] or Organizations: Yes   Attends Archivist Meetings: 1 to 4 times per year   Marital Status: Widowed   Review of systems General:  negative for malaise, night sweats, fever, chills, weight loss Neck: Negative for lumps, goiter, pain and significant neck swelling Resp: Negative for cough, wheezing, dyspnea at rest CV: Negative for chest pain, leg swelling, palpitations,  orthopnea GI: denies melena, hematochezia, nausea, vomiting, diarrhea, dysphagia, odyonophagia, early satiety or unintentional weight loss. +constipation MSK: Negative for joint pain or swelling, back pain, and muscle pain. Derm: Negative for itching or rash Psych: Denies depression, anxiety, memory loss, confusion. No homicidal or suicidal ideation.  Heme: Negative for prolonged bleeding, bruising easily, and swollen nodes. Endocrine: Negative for cold or heat intolerance, polyuria, polydipsia and goiter. Neuro: negative for tremor, gait imbalance, syncope and seizures. The remainder of the review of systems is noncontributory.  Physical Exam: BP (!) 154/72 (BP Location: Left Arm, Patient Position: Sitting, Cuff Size: Small)    Pulse 62    Temp 99.2 F (37.3 C) (Oral)    Ht 5\' 2"  (1.575 m)    Wt 176 lb 11.2 oz (80.2 kg)    BMI 32.32 kg/m  General:   Alert and oriented. No distress noted. Pleasant and cooperative.  Head:  Normocephalic and atraumatic. Eyes:  Conjuctiva clear without scleral icterus. Mouth:  Oral mucosa pink and moist. Good dentition. No lesions. Heart: Normal rate and rhythm, s1 and s2 heart sounds present.  Lungs: Clear lung sounds in all lobes. Respirations equal and unlabored. Abdomen:  +BS, soft, non-tender and non-distended. No rebound or guarding. No HSM or masses noted. Derm: No palmar erythema or jaundice Msk:  Symmetrical without gross deformities. Normal posture. Extremities:  Without edema. Neurologic:  Alert and  oriented x4 Psych:  Alert and cooperative. Normal mood and affect.  Invalid input(s): 6 MONTHS   ASSESSMENT: Maria Ritter is a 76 y.o. female presenting today as a new patient for ED follow up visit for rectal bleeding.  Given presence of hemorrhoids and hard stool in rectal vault, I suspect patient's rectal bleeding was related to hemorrhoidal bleeding in the presence of significant constipation. Reassuringly she has presented with no further  rectal bleeding since changing her diet and having softer stools. We discussed starting miralax to help prevent future episodes of constipation and to help avoid straining. I am providing Rx for anusol cream for her hemorrhoids. I discussed with her that we could do watchful waiting and proceed with colonoscopy if further rectal bleeding or other red flag symptoms occurred or we could go ahead and proceed with colonoscopy. Indications, risks and benefits of procedure discussed in detail with patient. Patient verbalized understanding and wishes to proceed with Colonoscopy at this time. She will continue to do a diet high in fruits, veggies/fiber and whole grains as well as add miralax for additional constipation treatment. She should drink plenty of water to stay well hydrated as this will also help stools to remain softer. She will make me aware of any further episodes of rectal bleeding.    PLAN:  Anusol cream BID x7 days, PRN thereafter 2. Miralax (17g/day, then increase to 17g BID if no improvement after 1 week, can increase to 17g TID if no improvement after 2 weeks). 3. Schedule colonsocopy 4. Continue with high fiber/fruits/veggies and whole grains in diet as well as plenty of water  Follow up: TBD after Colonoscopy  Avanell Banwart L. Alver Sorrow, MSN, APRN, AGNP-C Adult-Gerontology Nurse Practitioner [facility]

## 2021-09-03 NOTE — Patient Instructions (Signed)
We will get you scheduled for colonoscopy for further evaluation of your rectal bleeding, though it is likely from hemorrhoids, I am sending you some medication to use to help shrink these. It is important to ensure you are eating a diet high in fruits and veggies and drinking plenty of water to avoid constipation. You can also try using miralax to keep stools soft  Start taking Miralax 1 capful every day for one week. If bowel movements do not improve, increase to 1 capful every 12 hours. If after two weeks there is no improvement, increase to 1 capful every 8 hours

## 2021-09-03 NOTE — Telephone Encounter (Signed)
Ahnaf Caponi Ann Craige Patel, CMA  ?

## 2021-09-04 ENCOUNTER — Other Ambulatory Visit (INDEPENDENT_AMBULATORY_CARE_PROVIDER_SITE_OTHER): Payer: Self-pay

## 2021-09-04 DIAGNOSIS — K649 Unspecified hemorrhoids: Secondary | ICD-10-CM | POA: Insufficient documentation

## 2021-09-04 DIAGNOSIS — K625 Hemorrhage of anus and rectum: Secondary | ICD-10-CM | POA: Insufficient documentation

## 2021-09-04 DIAGNOSIS — K5900 Constipation, unspecified: Secondary | ICD-10-CM | POA: Insufficient documentation

## 2021-09-07 ENCOUNTER — Encounter (INDEPENDENT_AMBULATORY_CARE_PROVIDER_SITE_OTHER): Payer: Self-pay

## 2021-09-07 DIAGNOSIS — M810 Age-related osteoporosis without current pathological fracture: Secondary | ICD-10-CM | POA: Diagnosis not present

## 2021-09-08 NOTE — Progress Notes (Signed)
Cardiology Office Note  Date: 09/09/2021   ID: Maria Ritter, DOB May 18, 1946, MRN [id number]  PCP:  Celene Squibb, MD  Cardiologist:  Rozann Lesches, MD Electrophysiologist:  None   Chief Complaint  Patient presents with   Cardiac follow-up    History of Present Illness: Maria Ritter is a 76 y.o. female last seen in January 2022.  She is here for a routine visit.  She does not report any exertional chest pain and has baseline NYHA class II dyspnea with typical activities.  She has had some trouble with leg swelling, skin itching and some discoloration distally on her legs.  She was seen by Dr. Donnetta Hutching with ABIs as noted below, no ominous findings, recommendation for lower extremity compression stockings which she has not used.  Medicines have also been adjusted, she was taken off Norvasc which has helped with some of her leg swelling.  Currently not requiring any diuretics.  I reviewed her medications.  She remains on Crestor and had most recent LDL documented at 34.  Also eating a higher fiber diet.  She had an episode of rectal bleeding and is pending colonoscopy for screening.  I personally reviewed her ECG today which shows sinus rhythm with atrial bigeminy.  Past Medical History:  Diagnosis Date   Breast cancer ([place]) 2010   Right Lumpectomy and XRT   Carotid artery disease (HCC)    Mild to moderate bilateral ICA disease, left greater than right 07/2015   Essential hypertension    GERD (gastroesophageal reflux disease)    Glaucoma    Hyperlipidemia    Hypothyroidism    Osteoarthritis    Polymyalgia rheumatica ([place])     Past Surgical History:  Procedure Laterality Date   AXILLARY LYMPH NODE BIOPSY Right 05/21/2019   Procedure: RIGHT AXILLARY LYMPH NODE BIOPSY;  Surgeon: Aviva Signs, MD;  Location: [facility];  Service: General;  Laterality: Right;   BASAL CELL CARCINOMA EXCISION  2010   Right breast   BREAST LUMPECTOMY Right 2010   ENDOMETRIAL ABLATION  2002    FINGER SURGERY     Minor surgical intervention following a crush injury to the left fourth finger   TOE SURGERY Right 07/30/2020   2nd toe on right foot   TOOTH EXTRACTION  01/2019    Current Outpatient Medications  Medication Sig Dispense Refill   Acetaminophen (ACETAMINOPHEN EXTRA STRENGTH) 500 MG capsule Take 1,000 mg by mouth every 6 (six) hours as needed for pain.     acetaminophen (TYLENOL) 650 MG CR tablet Take 1,300 mg by mouth every 8 (eight) hours as needed for pain.     Ascorbic Acid (VITAMIN C) 1000 MG tablet Take 1,000 mg by mouth daily.     aspirin EC 81 MG tablet Take 81 mg by mouth daily. Swallow whole.     CALCIUM CITRATE-VITAMIN D PO Take 1 tablet by mouth daily.      Cholecalciferol (VITAMIN D3) 50 MCG (2000 UT) capsule Take 2,000 Units by mouth 2 (two) times daily.     clobetasol ointment (TEMOVATE) 5.02 % Apply 1 application topically 2 (two) times daily. Do not use longer than two weeks at a time. (Patient taking differently: Apply 1 application topically 2 (two) times daily as needed (itching). Do not use longer than two weeks at a time.) 30 g 2   famotidine (PEPCID) 20 MG tablet Take 1 tablet (20 mg total) by mouth 2 (two) times daily as needed for heartburn or indigestion. (Patient  taking differently: Take 20 mg by mouth daily.) 60 tablet 5   fluticasone (FLONASE) 50 MCG/ACT nasal spray Place 2 sprays into both nostrils at bedtime as needed for allergies.     folic acid (FOLVITE) 1 MG tablet Take 1 mg by mouth daily.     GLUCOSAMINE-CHONDROITIN PO Take 1 tablet by mouth 2 (two) times daily.      hydrocortisone (ANUSOL-HC) 2.5 % rectal cream Place 1 application rectally 2 (two) times daily. Twice a day for 7 days then as needed thereafter 30 g 2   hydrOXYzine (VISTARIL) 25 MG capsule Take 25 mg by mouth every 6 (six) hours as needed for itching.     ketoconazole (NIZORAL) 2 % cream Apply 1 application topically daily as needed for irritation.     levothyroxine  (SYNTHROID) 150 MCG tablet Take 150 mcg by mouth daily.     lisinopril (ZESTRIL) 20 MG tablet Take 20 mg by mouth daily.     Magnesium 250 MG TABS Take 250 mg by mouth daily.      meloxicam (MOBIC) 7.5 MG tablet Take 7.5 mg by mouth daily as needed for pain.     methocarbamol (ROBAXIN) 500 MG tablet Take 500 mg by mouth at bedtime as needed for muscle spasms.     methotrexate (RHEUMATREX) 2.5 MG tablet Take 10 mg by mouth every Monday. Caution:Chemotherapy. Protect from light.     montelukast (SINGULAIR) 10 MG tablet Take 1 tablet (10 mg total) by mouth at bedtime. 30 tablet 5   Omega-3 1000 MG CAPS Take 1,000 mg by mouth 2 (two) times daily.     Polyethyl Glycol-Propyl Glycol (SYSTANE OP) Place 1 drop into both eyes 3 (three) times daily as needed (dry eyes).     polyethylene glycol (MIRALAX / GLYCOLAX) 17 g packet Take 17 g by mouth daily as needed for moderate constipation.     polyethylene glycol-electrolytes (TRILYTE) 420 g solution Take 4,000 mLs by mouth as directed. 4000 mL 0   rosuvastatin (CRESTOR) 10 MG tablet Take 10 mg by mouth daily.     silver sulfADIAZINE (SILVADENE) 1 % cream Apply 1 application topically daily. 25 g 1   triamcinolone ointment (KENALOG) 0.1 % Apply 1 application topically 2 (two) times daily. (Patient taking differently: Apply 1 application topically daily as needed (dry/itchy skin).) 454 g 1   Turmeric 500 MG CAPS Take 500 mg by mouth daily.     vitamin B-12 (CYANOCOBALAMIN) 500 MCG tablet Take 500 mcg by mouth daily.     zinc gluconate 50 MG tablet Take 50 mg by mouth daily.     No current facility-administered medications for this visit.   Allergies:  Mango flavor, Diclofenac sodium, Erythromycin, Ivp dye [iodinated contrast media], Penicillamine, Penicillins, Red dye, Sulfonamide derivatives, Neosporin [bacitracin-polymyxin b], and Tape   ROS:  Arthritic pains.  Physical Exam: VS:  BP 132/70    Pulse 64    Ht 5\' 2"  (1.575 m)    Wt 177 lb 6.4 oz (80.5 kg)     SpO2 100%    BMI 32.45 kg/m , BMI Body mass index is 32.45 kg/m.  Wt Readings from Last 3 Encounters:  09/09/21 177 lb 6.4 oz (80.5 kg)  09/03/21 176 lb 11.2 oz (80.2 kg)  08/16/21 180 lb (81.6 kg)    General: Patient appears comfortable at rest. HEENT: Conjunctiva and lids normal, wearing a mask. Neck: Supple, no elevated JVP or carotid bruits, no thyromegaly. Lungs: Clear to auscultation, nonlabored breathing at  rest. Cardiac: Regular rate and rhythm with ectopy, no S3 or significant systolic murmur, no pericardial rub. Extremities: Venous stasis changes, also spider veins and varicosities.  ECG:  An ECG dated 09/24/2020 was personally reviewed today and demonstrated:  Sinus rhythm.  Recent Labwork: 08/16/2021: ALT 27; AST 39; BUN 24; Creatinine, Ser 0.75; Hemoglobin 13.6; Platelets 277; Potassium 4.2; Sodium 138  November 2022: Hgb13.6, platelets 298, BUN 17, creatinine 0.83, potassium 4.9, AST 31, ALT 20, cholesterol 133, TG 167, HDL 44, LDL 61, HgbA1c 5.8%  Other Studies Reviewed Today:  Carotid Dopplers 10/10/2020: IMPRESSION: 1. Right carotid artery system: Less than 50% stenosis secondary to mild multifocal atherosclerotic plaque formation at the carotid bulb.   2. Left carotid artery system: Less than 50% stenosis secondary to mild multifocal atherosclerotic plaque formation at the carotid bulb.   3.  Vertebral artery system: Patent with antegrade flow bilaterally.  ABIs 04/01/2021: Summary:  Right: Resting right ankle-brachial index is within normal range. No  evidence of significant right lower extremity arterial disease. The right  toe-brachial index is normal. RT great toe pressure = 120 mmHg.   Left: Resting left ankle-brachial index indicates mild left lower  extremity arterial disease. The left toe-brachial index is abnormal. LT  Great toe pressure = 96 mmHg.   Assessment and Plan:  1.  Mild, asymptomatic carotid artery disease.  Last carotid Dopplers  were in February 2022 as noted above.  She continues on Crestor with good LDL control, also aspirin daily.  2.  Mildly abnormal ABIs without claudication symptoms.  She has had evaluation by Dr. Donnetta Hutching.  Continue aspirin and statin.  No distal ulcerations.  Her leg swelling is more likely related to venous etiology with spider veins and some varicosities.  Symptoms have been somewhat better since stopping Norvasc.  She is no longer on a diuretic.  3.  Hyperlipidemia, continues on Crestor with recent LDL 61.  4.  History of mildly abnormal Myoview without active angina symptoms.  She remains on aspirin and Crestor.  5.  PACs and atrial bigeminy, asymptomatic.  Medication Adjustments/Labs and Tests Ordered: Current medicines are reviewed at length with the patient today.  Concerns regarding medicines are outlined above.   Tests Ordered: No orders of the defined types were placed in this encounter.   Medication Changes: No orders of the defined types were placed in this encounter.   Disposition:  Follow up  1 year.  Signed, Satira Sark, MD, [facility] 09/09/2021 10:10 AM    [place] [facility] [address], [place], [place] [address] Phone: [phone]; Fax: [phone]

## 2021-09-09 ENCOUNTER — Telehealth: Payer: Self-pay | Admitting: Cardiology

## 2021-09-09 ENCOUNTER — Ambulatory Visit: Payer: Medicare HMO | Admitting: Cardiology

## 2021-09-09 ENCOUNTER — Other Ambulatory Visit: Payer: Self-pay

## 2021-09-09 ENCOUNTER — Encounter: Payer: Self-pay | Admitting: Cardiology

## 2021-09-09 VITALS — BP 132/70 | HR 64 | Ht 62.0 in | Wt 177.4 lb

## 2021-09-09 DIAGNOSIS — I739 Peripheral vascular disease, unspecified: Secondary | ICD-10-CM

## 2021-09-09 DIAGNOSIS — E782 Mixed hyperlipidemia: Secondary | ICD-10-CM | POA: Diagnosis not present

## 2021-09-09 DIAGNOSIS — I6523 Occlusion and stenosis of bilateral carotid arteries: Secondary | ICD-10-CM

## 2021-09-09 NOTE — Patient Instructions (Addendum)

## 2021-09-09 NOTE — Telephone Encounter (Signed)
Patient came back into the office after her visit stating she forgot to speak with Dr. Domenic Polite about her treatments that she's going to need to start for her osteoporosis.   There are 3 different options for medications that she can do and would like his opinion on what would be best for her.    Please give pt a call @ [phone]

## 2021-09-09 NOTE — Telephone Encounter (Signed)
Says she was given 3 options to treat osteoporosis and wants to know if there are any reasons from a cardiac standpoint that she should not take either of the 3.  Taking a Pill weekly (unsure of name) 2. Prolia inj every 6 mths 3. Reclast IV yearly   Discussed with Domenic Polite and per SM, okay to take either of the 3 Patient informed and verbalized understanding of plan.

## 2021-09-09 NOTE — Telephone Encounter (Signed)
Returned call to pt, no answer. No voicemail. Will try later.

## 2021-09-15 NOTE — Patient Instructions (Signed)
Maria Ritter  09/15/2021     @PREFPERIOPPHARMACY @   Your procedure is scheduled on  09/18/2021.   Report to [facility]   Call this number if you have problems the morning of surgery:  [phone]   Remember:  Follow the diet and prep instructions given to you by the office.    Take these medicines the morning of surgery with A SIP OF WATER                pepcid, vistaril, levothyroxine, mobic (if needed).     Do not wear jewelry, make-up or nail polish.  Do not wear lotions, powders, or perfumes, or deodorant.  Do not shave 48 hours prior to surgery.  Men may shave face and neck.  Do not bring valuables to the hospital.  [facility] is not responsible for any belongings or valuables.  Contacts, dentures or bridgework may not be worn into surgery.  Leave your suitcase in the car.  After surgery it may be brought to your room.  For patients admitted to the hospital, discharge time will be determined by your treatment team.  Patients discharged the day of surgery will not be allowed to drive home and must have someone with them for 24 hours.    Special instructions:   DO NOT smoke tobacco or vape for 24 hours before your procedure.  Please read over the following fact sheets that you were given. Anesthesia Post-op Instructions and Care and Recovery After Surgery      Colonoscopy, Adult, Care After This sheet gives you information about how to care for yourself after your procedure. Your health care provider may also give you more specific instructions. If you have problems or questions, contact your health care provider. What can I expect after the procedure? After the procedure, it is common to have: A small amount of blood in your stool for 24 hours after the procedure. Some gas. Mild cramping or bloating of your abdomen. Follow these instructions at home: Eating and drinking  Drink enough fluid to keep your urine pale yellow. Follow  instructions from your health care provider about eating or drinking restrictions. Resume your normal diet as instructed by your health care provider. Avoid heavy or fried foods that are hard to digest. Activity Rest as told by your health care provider. Avoid sitting for a long time without moving. Get up to take short walks every 1-2 hours. This is important to improve blood flow and breathing. Ask for help if you feel weak or unsteady. Return to your normal activities as told by your health care provider. Ask your health care provider what activities are safe for you. Managing cramping and bloating  Try walking around when you have cramps or feel bloated. Apply heat to your abdomen as told by your health care provider. Use the heat source that your health care provider recommends, such as a moist heat pack or a heating pad. Place a towel between your skin and the heat source. Leave the heat on for 20-30 minutes. Remove the heat if your skin turns bright red. This is especially important if you are unable to feel pain, heat, or cold. You may have a greater risk of getting burned. General instructions If you were given a sedative during the procedure, it can affect you for several hours. Do not drive or operate machinery until your health care provider says that it is safe. For the  first 24 hours after the procedure: Do not sign important documents. Do not drink alcohol. Do your regular daily activities at a slower pace than normal. Eat soft foods that are easy to digest. Take over-the-counter and prescription medicines only as told by your health care provider. Keep all follow-up visits as told by your health care provider. This is important. Contact a health care provider if: You have blood in your stool 2-3 days after the procedure. Get help right away if you have: More than a small spotting of blood in your stool. Large blood clots in your stool. Swelling of your abdomen. Nausea or  vomiting. A fever. Increasing pain in your abdomen that is not relieved with medicine. Summary After the procedure, it is common to have a small amount of blood in your stool. You may also have mild cramping and bloating of your abdomen. If you were given a sedative during the procedure, it can affect you for several hours. Do not drive or operate machinery until your health care provider says that it is safe. Get help right away if you have a lot of blood in your stool, nausea or vomiting, a fever, or increased pain in your abdomen. This information is not intended to replace advice given to you by your health care provider. Make sure you discuss any questions you have with your health care provider. Document Revised: 06/22/2019 Document Reviewed: 03/12/2019 Elsevier Patient Education  [place] After This sheet gives you information about how to care for yourself after your procedure. Your health care provider may also give you more specific instructions. If you have problems or questions, contact your health care provider. What can I expect after the procedure? After the procedure, it is common to have: Tiredness. Forgetfulness about what happened after the procedure. Impaired judgment for important decisions. Nausea or vomiting. Some difficulty with balance. Follow these instructions at home: For the time period you were told by your health care provider:   Rest as needed. Do not participate in activities where you could fall or become injured. Do not drive or use machinery. Do not drink alcohol. Do not take sleeping pills or medicines that cause drowsiness. Do not make important decisions or sign legal documents. Do not take care of children on your own. Eating and drinking Follow the diet that is recommended by your health care provider. Drink enough fluid to keep your urine pale yellow. If you vomit: Drink water, juice, or soup when  you can drink without vomiting. Make sure you have little or no nausea before eating solid foods. General instructions Have a responsible adult stay with you for the time you are told. It is important to have someone help care for you until you are awake and alert. Take over-the-counter and prescription medicines only as told by your health care provider. If you have sleep apnea, surgery and certain medicines can increase your risk for breathing problems. Follow instructions from your health care provider about wearing your sleep device: Anytime you are sleeping, including during daytime naps. While taking prescription pain medicines, sleeping medicines, or medicines that make you drowsy. Avoid smoking. Keep all follow-up visits as told by your health care provider. This is important. Contact a health care provider if: You keep feeling nauseous or you keep vomiting. You feel light-headed. You are still sleepy or having trouble with balance after 24 hours. You develop a rash. You have a fever. You have redness or swelling around the  IV site. Get help right away if: You have trouble breathing. You have new-onset confusion at home. Summary For several hours after your procedure, you may feel tired. You may also be forgetful and have poor judgment. Have a responsible adult stay with you for the time you are told. It is important to have someone help care for you until you are awake and alert. Rest as told. Do not drive or operate machinery. Do not drink alcohol or take sleeping pills. Get help right away if you have trouble breathing, or if you suddenly become confused. This information is not intended to replace advice given to you by your health care provider. Make sure you discuss any questions you have with your health care provider. Document Revised: 05/01/2020 Document Reviewed: 07/19/2019 Elsevier Patient Education  2022 [employer].

## 2021-09-16 ENCOUNTER — Other Ambulatory Visit: Payer: Self-pay

## 2021-09-16 ENCOUNTER — Encounter (HOSPITAL_COMMUNITY)
Admission: RE | Admit: 2021-09-16 | Discharge: 2021-09-16 | Disposition: A | Discharge status: Home / Self Care | Payer: Medicare HMO | Source: Ambulatory Visit | Attending: Gastroenterology | Admitting: Gastroenterology

## 2021-09-16 ENCOUNTER — Encounter (HOSPITAL_COMMUNITY): Payer: Self-pay

## 2021-09-16 ENCOUNTER — Other Ambulatory Visit (INDEPENDENT_AMBULATORY_CARE_PROVIDER_SITE_OTHER): Payer: Self-pay

## 2021-09-16 DIAGNOSIS — K625 Hemorrhage of anus and rectum: Secondary | ICD-10-CM

## 2021-09-18 ENCOUNTER — Ambulatory Visit (HOSPITAL_COMMUNITY): Payer: Medicare HMO | Admitting: Anesthesiology

## 2021-09-18 ENCOUNTER — Ambulatory Visit (HOSPITAL_COMMUNITY)
Admission: RE | Admit: 2021-09-18 | Discharge: 2021-09-18 | Disposition: A | Discharge status: Home / Self Care | Payer: Medicare HMO | Attending: Gastroenterology | Admitting: Gastroenterology

## 2021-09-18 ENCOUNTER — Encounter (HOSPITAL_COMMUNITY)
Admission: RE | Disposition: A | Discharge status: Home / Self Care | Payer: Self-pay | Source: Home / Self Care | Attending: Gastroenterology

## 2021-09-18 ENCOUNTER — Encounter (HOSPITAL_COMMUNITY): Payer: Self-pay | Admitting: Gastroenterology

## 2021-09-18 ENCOUNTER — Other Ambulatory Visit: Payer: Self-pay

## 2021-09-18 DIAGNOSIS — K5900 Constipation, unspecified: Secondary | ICD-10-CM | POA: Diagnosis not present

## 2021-09-18 DIAGNOSIS — M353 Polymyalgia rheumatica: Secondary | ICD-10-CM | POA: Insufficient documentation

## 2021-09-18 DIAGNOSIS — D123 Benign neoplasm of transverse colon: Secondary | ICD-10-CM | POA: Diagnosis not present

## 2021-09-18 DIAGNOSIS — I2510 Atherosclerotic heart disease of native coronary artery without angina pectoris: Secondary | ICD-10-CM | POA: Diagnosis not present

## 2021-09-18 DIAGNOSIS — K219 Gastro-esophageal reflux disease without esophagitis: Secondary | ICD-10-CM | POA: Insufficient documentation

## 2021-09-18 DIAGNOSIS — Z853 Personal history of malignant neoplasm of breast: Secondary | ICD-10-CM | POA: Insufficient documentation

## 2021-09-18 DIAGNOSIS — K635 Polyp of colon: Secondary | ICD-10-CM

## 2021-09-18 DIAGNOSIS — K648 Other hemorrhoids: Secondary | ICD-10-CM | POA: Diagnosis not present

## 2021-09-18 DIAGNOSIS — E039 Hypothyroidism, unspecified: Secondary | ICD-10-CM | POA: Insufficient documentation

## 2021-09-18 DIAGNOSIS — I10 Essential (primary) hypertension: Secondary | ICD-10-CM | POA: Diagnosis not present

## 2021-09-18 DIAGNOSIS — K5730 Diverticulosis of large intestine without perforation or abscess without bleeding: Secondary | ICD-10-CM | POA: Diagnosis not present

## 2021-09-18 DIAGNOSIS — E785 Hyperlipidemia, unspecified: Secondary | ICD-10-CM | POA: Diagnosis not present

## 2021-09-18 DIAGNOSIS — K625 Hemorrhage of anus and rectum: Secondary | ICD-10-CM | POA: Insufficient documentation

## 2021-09-18 HISTORY — PX: COLONOSCOPY WITH PROPOFOL: SHX5780

## 2021-09-18 HISTORY — PX: POLYPECTOMY: SHX5525

## 2021-09-18 LAB — HM COLONOSCOPY

## 2021-09-18 SURGERY — COLONOSCOPY WITH PROPOFOL
Anesthesia: General

## 2021-09-18 MED ORDER — LIDOCAINE HCL (CARDIAC) PF 100 MG/5ML IV SOSY
PREFILLED_SYRINGE | INTRAVENOUS | Status: DC | PRN
Start: 2021-09-18 — End: 2021-09-18
  Administered 2021-09-18: 50 mg via INTRAVENOUS

## 2021-09-18 MED ORDER — LACTATED RINGERS IV SOLN: INTRAVENOUS | Status: DC

## 2021-09-18 MED ORDER — PROPOFOL 500 MG/50ML IV EMUL
INTRAVENOUS | Status: DC | PRN
  Administered 2021-09-18: 150 ug/kg/min via INTRAVENOUS

## 2021-09-18 MED ORDER — PROPOFOL 10 MG/ML IV BOLUS
INTRAVENOUS | Status: DC | PRN
Start: 2021-09-18 — End: 2021-09-18
  Administered 2021-09-18: 100 mg via INTRAVENOUS

## 2021-09-18 MED ORDER — LACTATED RINGERS IV SOLN: INTRAVENOUS | Status: DC | PRN

## 2021-09-18 MED ORDER — STERILE WATER FOR IRRIGATION IR SOLN
Status: DC | PRN
  Administered 2021-09-18: 120 mL

## 2021-09-18 NOTE — Discharge Instructions (Signed)
You are being discharged to home.  Resume your previous diet.  We are waiting for your pathology results.  Your physician has recommended a repeat colonoscopy for surveillance based on pathology results.  

## 2021-09-18 NOTE — Anesthesia Preprocedure Evaluation (Signed)
Anesthesia Evaluation  Patient identified by MRN, date of birth, ID band Patient awake    Reviewed: Allergy & Precautions, NPO status , Patient's Chart, lab work & pertinent test results  History of Anesthesia Complications (+) PONV and history of anesthetic complications  Airway Mallampati: III  TM Distance: >3 FB Neck ROM: Full    Dental no notable dental hx. (+) Teeth Intact Overbite:   Pulmonary neg pulmonary ROS,    Pulmonary exam normal breath sounds clear to auscultation       Cardiovascular Exercise Tolerance: Good hypertension, Pt. on medications negative cardio ROS Normal cardiovascular examI Rhythm:Regular Rate:Normal     Neuro/Psych negative neurological ROS  negative psych ROS   GI/Hepatic Neg liver ROS, GERD  Medicated and Controlled,  Endo/Other  Hypothyroidism   Renal/GU negative Renal ROS  negative genitourinary   Musculoskeletal  (+) Arthritis ,   Abdominal   Peds negative pediatric ROS (+)  Hematology negative hematology ROS (+)   Anesthesia Other Findings   Reproductive/Obstetrics negative OB ROS                             Anesthesia Physical  Anesthesia Plan  ASA: 2  Anesthesia Plan: General   Post-op Pain Management:    Induction: Intravenous  PONV Risk Score and Plan: 3 and TIVA  Airway Management Planned: Natural Airway and Nasal Cannula  Additional Equipment:   Intra-op Plan:   Post-operative Plan:   Informed Consent: I have reviewed the patients History and Physical, chart, labs and discussed the procedure including the risks, benefits and alternatives for the proposed anesthesia with the patient or authorized representative who has indicated his/her understanding and acceptance.     Dental advisory given  Plan Discussed with: CRNA  Anesthesia Plan Comments:         Anesthesia Quick Evaluation

## 2021-09-18 NOTE — Anesthesia Procedure Notes (Signed)
Date/Time: 09/18/2021 9:05 AM Performed by: Orlie Dakin, CRNA Pre-anesthesia Checklist: Patient identified, Emergency Drugs available, Suction available and Patient being monitored Patient Re-evaluated:Patient Re-evaluated prior to induction Oxygen Delivery Method: Nasal cannula Induction Type: IV induction Placement Confirmation: positive ETCO2

## 2021-09-18 NOTE — Anesthesia Postprocedure Evaluation (Signed)
Anesthesia Post Note  Patient: AIDE WOJNAR  Procedure(s) Performed: COLONOSCOPY WITH PROPOFOL POLYPECTOMY  Patient location during evaluation: Endoscopy Anesthesia Type: General Level of consciousness: awake and alert Pain management: pain level controlled Vital Signs Assessment: post-procedure vital signs reviewed and stable Respiratory status: spontaneous breathing, nonlabored ventilation, respiratory function stable and patient connected to nasal cannula oxygen Cardiovascular status: blood pressure returned to baseline and stable Postop Assessment: no apparent nausea or vomiting Anesthetic complications: no   No notable events documented.   Last Vitals:  Vitals:   09/18/21 0921 09/18/21 0926  BP:  110/73  Pulse: (!) 59   Resp: 16   Temp: 36.5 C   SpO2: 100%     Last Pain:  Vitals:   09/18/21 0921  TempSrc: Oral  PainSc: 0-No pain                 Trixie Rude

## 2021-09-18 NOTE — Op Note (Signed)
[facility] Patient Name: Maria Ritter Procedure Date: 09/18/2021 8:53 AM MRN: [id number] Date of Birth: [birth year] Attending MD: [name] ,  CSN: [id number] Age: 76 Admit Type: Outpatient Procedure:                Colonoscopy Indications:              Rectal bleeding Providers:                [name], Rosina Lowenstein, RN, Raphael Gibney,                            Technician Referring MD:              Medicines:                Monitored Anesthesia Care Complications:            No immediate complications. Estimated Blood Loss:     Estimated blood loss: none. Procedure:                Pre-Anesthesia Assessment:                           - Prior to the procedure, a History and Physical                            was performed, and patient medications, allergies                            and sensitivities were reviewed. The patient's                            tolerance of previous anesthesia was reviewed.                           - The risks and benefits of the procedure and the                            sedation options and risks were discussed with the                            patient. All questions were answered and informed                            consent was obtained.                           - ASA Grade Assessment: II - A patient with mild                            systemic disease.                           After obtaining informed consent, the colonoscope                            was passed under direct vision. Throughout the  procedure, the patient's blood pressure, pulse, and                            oxygen saturations were monitored continuously. The                            [id number]-[id number] ([id number]) scope was introduced through                            the anus and advanced to the the terminal ileum.                            The colonoscopy was performed without difficulty.                            The patient  tolerated the procedure well. The                            quality of the bowel preparation was good. Scope In: 9:00:24 AM Scope Out: 9:16:19 AM Scope Withdrawal Time: 0 hours 10 minutes 44 seconds  Total Procedure Duration: 0 hours 15 minutes 55 seconds  Findings:      The perianal and digital rectal examinations were normal.      The terminal ileum appeared normal.      A 5 mm polyp was found in the transverse colon. The polyp was       semi-sessile. The polyp was removed with a cold snare. Resection and       retrieval were complete.      A few large-mouthed diverticula were found in the sigmoid colon.      Non-bleeding internal hemorrhoids were found during retroflexion. The       hemorrhoids were small. Impression:               - The examined portion of the ileum was normal.                           - One 5 mm polyp in the transverse colon, removed                            with a cold snare. Resected and retrieved.                           - Diverticulosis in the sigmoid colon.                           - Non-bleeding internal hemorrhoids. Moderate Sedation:      Per Anesthesia Care Recommendation:           - Discharge patient to home (ambulatory).                           - Resume previous diet.                           - Await pathology results.                           -  Repeat colonoscopy for surveillance based on                            pathology results. Procedure Code(s):        --- Professional ---                           [phone], Colonoscopy, flexible; with removal of                            tumor(s), polyp(s), or other lesion(s) by snare                            technique Diagnosis Code(s):        --- Professional ---                           K64.8, Other hemorrhoids                           K63.5, Polyp of colon                           K62.5, Hemorrhage of anus and rectum                           K57.30, Diverticulosis of large intestine  without                            perforation or abscess without bleeding CPT copyright 2019 American Medical Association. All rights reserved. The codes documented in this report are preliminary and upon coder review may  be revised to meet current compliance requirements. [name], MD [name],  09/18/2021 9:23:01 AM This report has been signed electronically. Number of Addenda: 0

## 2021-09-18 NOTE — Transfer of Care (Signed)
Immediate Anesthesia Transfer of Care Note  Patient: Maria Ritter  Procedure(s) Performed: COLONOSCOPY WITH PROPOFOL POLYPECTOMY  Patient Location: Short Stay  Anesthesia Type:General  Level of Consciousness: awake and oriented  Airway & Oxygen Therapy: Patient Spontanous Breathing  Post-op Assessment: Report given to RN and Post -op Vital signs reviewed and stable  Post vital signs: Reviewed and stable  Last Vitals:  Vitals Value Taken Time  BP    Temp    Pulse    Resp    SpO2      Last Pain:  Vitals:   09/18/21 0856  PainSc: 0-No pain         Complications: No notable events documented.

## 2021-09-18 NOTE — Interval H&P Note (Signed)
History and Physical Interval Note:  09/18/2021 8:20 AM  Maria Ritter  has presented today for surgery, with the diagnosis of Rectal Bleeding.  The various methods of treatment have been discussed with the patient and family. After consideration of risks, benefits and other options for treatment, the patient has consented to  Procedure(s) with comments: COLONOSCOPY WITH PROPOFOL (N/A) - 850 as a surgical intervention.  The patient's history has been reviewed, patient examined, no change in status, stable for surgery.  I have reviewed the patient's chart and labs.  Questions were answered to the patient's satisfaction.     [name] [place]a

## 2021-09-21 ENCOUNTER — Encounter (INDEPENDENT_AMBULATORY_CARE_PROVIDER_SITE_OTHER): Payer: Self-pay | Admitting: *Deleted

## 2021-09-21 ENCOUNTER — Encounter (HOSPITAL_COMMUNITY): Payer: Self-pay | Admitting: Gastroenterology

## 2021-09-21 LAB — SURGICAL PATHOLOGY

## 2021-09-29 DIAGNOSIS — I10 Essential (primary) hypertension: Secondary | ICD-10-CM | POA: Diagnosis not present

## 2021-09-29 DIAGNOSIS — E782 Mixed hyperlipidemia: Secondary | ICD-10-CM | POA: Diagnosis not present

## 2021-10-16 ENCOUNTER — Ambulatory Visit: Payer: Medicare HMO | Admitting: Allergy & Immunology

## 2021-10-16 ENCOUNTER — Encounter: Payer: Self-pay | Admitting: Allergy & Immunology

## 2021-10-16 ENCOUNTER — Other Ambulatory Visit: Payer: Self-pay

## 2021-10-16 VITALS — BP 130/78 | HR 62 | Temp 97.3°F | Resp 18

## 2021-10-16 DIAGNOSIS — J3089 Other allergic rhinitis: Secondary | ICD-10-CM | POA: Diagnosis not present

## 2021-10-16 DIAGNOSIS — L2089 Other atopic dermatitis: Secondary | ICD-10-CM

## 2021-10-16 DIAGNOSIS — J302 Other seasonal allergic rhinitis: Secondary | ICD-10-CM

## 2021-10-16 DIAGNOSIS — L299 Pruritus, unspecified: Secondary | ICD-10-CM | POA: Diagnosis not present

## 2021-10-16 MED ORDER — HYDROXYZINE HCL 25 MG PO TABS: 25.0000 mg | ORAL_TABLET | Freq: Every evening | ORAL | 5 refills | Status: DC | PRN

## 2021-10-16 NOTE — Patient Instructions (Addendum)
1. Itching with sensitizations to grasses, indoor molds, dust mites - Continue with Allegra (fexofenadine) 180mg  UP TO TWICE DAILY. - Continue with Singulair (montelukast) 10mg  DAILY (CAN TAKE WITH Allegra at the same time) - You can use the hydroxyzine every 6-8 hours on top of that (can add 15-30 minutes after Allegra, if this has not helped at all).   2. Eczema - Continue with moisturizing twice daily as as you are doing. - Continue with triamcinolone 0.1% ointment. - CONTINUE WITH clobetasol 0.05% ointment twice daily for TWO WEEKS at a time (can use with thick skinned areas, avoiding the face, fingers, axilla, and groin.  3. Return in about 6 months (around 04/15/2022).    Please inform us of any Emergency Department visits, hospitalizations, or changes in symptoms. Call us before going to the ED for breathing or allergy symptoms since we might be able to fit you in for a sick visit. Feel free to contact us anytime with any questions, problems, or concerns.  It was a pleasure to see you again today!  Websites that have reliable patient information: 1. American Academy of Asthma, Allergy, and Immunology: www.aaaai.org 2. Food Allergy Research and Education (FARE): foodallergy.org 3. Mothers of Asthmatics: http://www.asthmacommunitynetwork.org 4. American College of Allergy, Asthma, and Immunology: www.acaai.org   COVID-19 Vaccine Information can be found at: ShippingScam.co.uk For questions related to vaccine distribution or appointments, please email vaccine@[email or web address] .[email or web address] or call [phone].   We realize that you might be concerned about having an allergic reaction to the COVID19 vaccines. To help with that concern, WE ARE OFFERING THE COVID19 VACCINES IN OUR OFFICE! Ask the front desk for dates!     Like [place] on [employer] and Instagram for our latest updates!      A healthy democracy works best when [employer]  participate! Make sure you are registered to vote! If you have moved or changed any of your contact information, you will need to get this updated before voting!  In some cases, you MAY be able to register to vote online: [email or web address]

## 2021-10-16 NOTE — Progress Notes (Signed)
FOLLOW UP  Date of Service/Encounter:  10/16/21   Assessment:   Itching - with some clear environmental allergic triggers    Eczema - on triamcinolone ointment BID PRN (once again declined Dupixent today)   Perennial and seasonal allergic rhinitis (grasses, indoor molds and dust mites)   Elevated absolute eosinophil count of 700 (April 2019)   Plan/Recommendations:   1. Itching with sensitizations to grasses, indoor molds, dust mites - Continue with Allegra (fexofenadine) 180mg  UP TO TWICE DAILY. - Continue with Singulair (montelukast) 10mg  DAILY (CAN TAKE WITH Allegra at the same time) - You can use the hydroxyzine every 6-8 hours on top of that (can add 15-30 minutes after Allegra, if this has not helped at all).   2. Eczema - Continue with moisturizing twice daily as as you are doing. - Continue with triamcinolone 0.1% ointment. - CONTINUE WITH clobetasol 0.05% ointment twice daily for TWO WEEKS at a time (can use with thick skinned areas, avoiding the face, fingers, axilla, and groin.  3. Return in about 6 months (around 04/15/2022).   Subjective:   Maria Ritter is a 76 y.o. female presenting today for follow up of  Chief Complaint  Patient presents with   Allergies    At night her back and legs starts itching really bad    Maria Ritter has a history of the following: Patient Active Problem List   Diagnosis Date Noted   Hemorrhoids 09/04/2021   Constipation 09/04/2021   Rectal bleeding 09/04/2021   Encounter for screening fecal occult blood testing 06/17/2021   Encounter for well woman exam with routine gynecological exam 06/17/2021   Abnormal stress test 08/02/2019   Postinflammatory skin changes 06/14/2019   Encounter for colorectal cancer screening 06/14/2019   Encounter for gynecological examination with Papanicolaou smear of cervix 06/14/2019   Lymph node enlargement    Flexural atopic dermatitis 01/31/2018   Itching 11/01/2017   Seasonal and  perennial allergic rhinitis 11/01/2017   History of breast cancer in female 04/21/2016   Routine cervical smear 04/21/2016   Vaginal atrophy 04/21/2016   OVERWEIGHT 01/28/2010   LEG CRAMPS, NOCTURNAL 01/28/2010   ELECTROCARDIOGRAM, ABNORMAL 01/28/2010   CARCINOMA IN SITU, RIGHT BREAST 11/27/2009   HYPERLIPIDEMIA 11/27/2009   ARM PAIN, LEFT 11/27/2009   HYPOTHYROIDISM 11/26/2009   Essential hypertension 11/26/2009    History obtained from: chart review and patient.  Maria Ritter is a 76 y.o. female presenting for a follow up visit.  She was last seen in August 2022.  At that time, we continue with Allegra 180 mg twice daily and Pepcid 20 mg twice daily.  We also continue with Singulair 10 mg daily and hydroxyzine every 6-8 hours.  Her eczema was not under good control.  We continue with triamcinolone mixed one-to-one with Eucerin twice daily as needed and added on clobetasol 0.5% ointment twice daily for 2 weeks.  We discussed Dupixent, but she was not willing to give it a shot.  Since last visit, she has continued to have itching. This is mostly at night. She thinks that her back might get too hot when she is sleeping and her back itches. She does use the triamcinolone on her legs which helps a lot. She also uses it on her arms which works very well.   She has montelukast which sometimes works at night. She will add on Allegra which will sometimes work on top of the montelukast. She also had hydroxyzine 25mg  that she uses when these two medications  do not work.   She is osteopenic throughout most of her body. She has osteoporosis in her arms. They are discussing starting bisphosphonate.  She is also looking into Evista, a SERM.   She is on Miralax every night for stool consistency. She has been titrating depending on her stool formation. She had polyps noted at her last colonoscopy and these were removed.   Otherwise, there have been no changes to her past medical history, surgical history,  family history, or social history.    Review of Systems  Constitutional: Negative.  Negative for chills, fever, malaise/fatigue and weight loss.  HENT: Negative.  Negative for congestion, ear discharge and ear pain.   Eyes:  Negative for pain, discharge and redness.  Respiratory:  Negative for cough, sputum production, shortness of breath and wheezing.   Cardiovascular: Negative.  Negative for chest pain and palpitations.  Gastrointestinal:  Negative for abdominal pain, constipation, diarrhea, heartburn, nausea and vomiting.  Skin:  Positive for itching and rash.  Neurological:  Negative for dizziness and headaches.  Endo/Heme/Allergies:  Negative for environmental allergies. Does not bruise/bleed easily.      Objective:   Blood pressure 130/78, pulse 62, temperature (!) 97.3 F (36.3 C), temperature source Temporal, resp. rate 18, SpO2 96 %. There is no height or weight on file to calculate BMI.    Physical Exam Vitals reviewed.  Constitutional:      Appearance: She is well-developed.  HENT:     Head: Normocephalic and atraumatic.     Right Ear: Tympanic membrane, ear canal and external ear normal.     Left Ear: Tympanic membrane, ear canal and external ear normal.     Nose: No nasal deformity, septal deviation, mucosal edema or rhinorrhea.     Right Turbinates: Enlarged, swollen and pale.     Left Turbinates: Enlarged, swollen and pale.     Right Sinus: No maxillary sinus tenderness or frontal sinus tenderness.     Left Sinus: No maxillary sinus tenderness or frontal sinus tenderness.     Mouth/Throat:     Mouth: Mucous membranes are not pale and not dry.     Pharynx: Uvula midline.  Eyes:     General: Lids are normal. No allergic shiner.       Right eye: No discharge.        Left eye: No discharge.     Conjunctiva/sclera: Conjunctivae normal.     Right eye: Right conjunctiva is not injected. No chemosis.    Left eye: Left conjunctiva is not injected. No chemosis.     Pupils: Pupils are equal, round, and reactive to light.  Cardiovascular:     Rate and Rhythm: Normal rate and regular rhythm.     Heart sounds: Normal heart sounds.  Pulmonary:     Effort: Pulmonary effort is normal. No tachypnea, accessory muscle usage or respiratory distress.     Breath sounds: Normal breath sounds. No wheezing, rhonchi or rales.  Chest:     Chest wall: No tenderness.  Lymphadenopathy:     Cervical: No cervical adenopathy.  Skin:    General: Skin is warm.     Capillary Refill: Capillary refill takes less than 2 seconds.     Coloration: Skin is not pale.     Findings: Rash present. No abrasion, erythema or petechiae. Rash is crusting and papular. Rash is not vesicular.     Comments: Eczematous lesions are improving.  However, she has some eschar formation from picking some lesions on  her upper back.  She has similar-appearing lesions on her bilateral arms.  Neurological:     Mental Status: She is alert.  Psychiatric:        Behavior: Behavior is cooperative.     Diagnostic studies: none     Salvatore Marvel, MD  [facility]

## 2021-10-19 ENCOUNTER — Telehealth: Payer: Self-pay | Admitting: *Deleted

## 2021-10-19 NOTE — Telephone Encounter (Signed)
PA Approved and faxed to pharmacy.

## 2021-10-19 NOTE — Telephone Encounter (Signed)
PA pending for Hydroxyzine.

## 2021-10-26 DIAGNOSIS — L4050 Arthropathic psoriasis, unspecified: Secondary | ICD-10-CM | POA: Diagnosis not present

## 2021-10-26 DIAGNOSIS — M810 Age-related osteoporosis without current pathological fracture: Secondary | ICD-10-CM | POA: Diagnosis not present

## 2021-10-27 DIAGNOSIS — I739 Peripheral vascular disease, unspecified: Secondary | ICD-10-CM | POA: Diagnosis not present

## 2021-10-28 ENCOUNTER — Other Ambulatory Visit: Payer: Self-pay

## 2021-10-28 ENCOUNTER — Ambulatory Visit: Payer: Medicare HMO | Admitting: Orthopedic Surgery

## 2021-10-28 ENCOUNTER — Ambulatory Visit: Payer: Medicare HMO

## 2021-10-28 ENCOUNTER — Encounter: Payer: Self-pay | Admitting: Orthopedic Surgery

## 2021-10-28 DIAGNOSIS — M25512 Pain in left shoulder: Secondary | ICD-10-CM

## 2021-10-28 DIAGNOSIS — S42032D Displaced fracture of lateral end of left clavicle, subsequent encounter for fracture with routine healing: Secondary | ICD-10-CM

## 2021-10-28 NOTE — Patient Instructions (Signed)

## 2021-10-29 NOTE — Progress Notes (Signed)
[facility] Return ? ?Assessment: ?Maria Ritter is a 76 y.o. female with the following: ?Left distal clavicle fracture; healed with recent onset left shoulder pain ? ?Plan: ?Maria Ritter had recent onset left shoulder pain.  This was atraumatic.  Since the onset, her pain has improved.  Repeat radiographs today demonstrates no acute injury.  The previous clavicle fracture remains well-healed.  At this point, I provided her with reassurance.  I have also given her some home exercises to complete for her left shoulder pain.  She can continue with medications as needed.  Follow-up as needed. ? ?Follow-up: ?Return if symptoms worsen or fail to improve. ? ? ?Subjective: ? ?Chief Complaint  ?Patient presents with  ? Shoulder Pain  ?  Left shld pain, ant/post.  No new injury, possibly slept on it wrong, ROM is ok.  Wants to get it checked, xrayed.    ? ? ?History of Present Illness: ?Maria Ritter is a 76 y.o. female who returns to clinic for repeat evaluation of her left shoulder.  She had done very well following healing of a left clavicle fracture.  However, she noted some pain within the last 1-2 weeks.  She states she may have slept on it wrong.  Otherwise she has not sustained a new injury.  Since the onset of the pain, it is gradually improved.  Her motion remains good.  She is not taking any medications on a consistent basis. ? ? ?Review of Systems: ?No fevers or chills ?No numbness or tingling ?No chest pain ?No shortness of breath ?No bowel or bladder dysfunction ?No GI distress ?No headaches ? ? ?Objective: ?There were no vitals taken for this visit. ? ?Physical Exam: ? ?Alert and oriented, no acute distress ? ?No bruising or swelling about the left shoulder.  Tenderness to palpation within the trapezius and the left side of her neck.  Forward flexion to 160 degrees.  Abduction to 90 degrees.  Internal rotation to the lumbar spine.  Strength is good.  Fingers are warm and well-perfused. ? ?IMAGING: ?I  personally ordered and reviewed the following images: ? ?X-ray of the left shoulder was obtained in clinic today.  No acute injuries are noted.  Glenohumeral joint is reduced.  Minimal degenerative changes are noted.  No proximal humeral migration.  Prior left clavicle fracture is healed with obvious callus formation. ? ?Impression: Left shoulder x-ray without acute injury, with healed left clavicle fracture. ? ?Mordecai Rasmussen, MD ?10/29/2021 ?10:11 AM ? ? ?

## 2021-11-25 DIAGNOSIS — M8580 Other specified disorders of bone density and structure, unspecified site: Secondary | ICD-10-CM | POA: Diagnosis not present

## 2021-11-25 DIAGNOSIS — Z6829 Body mass index (BMI) 29.0-29.9, adult: Secondary | ICD-10-CM | POA: Diagnosis not present

## 2021-11-25 DIAGNOSIS — R220 Localized swelling, mass and lump, head: Secondary | ICD-10-CM | POA: Diagnosis not present

## 2021-11-25 DIAGNOSIS — M5030 Other cervical disc degeneration, unspecified cervical region: Secondary | ICD-10-CM | POA: Diagnosis not present

## 2021-11-25 DIAGNOSIS — L4059 Other psoriatic arthropathy: Secondary | ICD-10-CM | POA: Diagnosis not present

## 2021-11-25 DIAGNOSIS — M25551 Pain in right hip: Secondary | ICD-10-CM | POA: Diagnosis not present

## 2021-11-25 DIAGNOSIS — M25561 Pain in right knee: Secondary | ICD-10-CM | POA: Diagnosis not present

## 2021-11-25 DIAGNOSIS — R945 Abnormal results of liver function studies: Secondary | ICD-10-CM | POA: Diagnosis not present

## 2021-11-25 DIAGNOSIS — M5441 Lumbago with sciatica, right side: Secondary | ICD-10-CM | POA: Diagnosis not present

## 2021-11-25 DIAGNOSIS — E663 Overweight: Secondary | ICD-10-CM | POA: Diagnosis not present

## 2022-01-04 DIAGNOSIS — E782 Mixed hyperlipidemia: Secondary | ICD-10-CM | POA: Diagnosis not present

## 2022-01-04 DIAGNOSIS — R7301 Impaired fasting glucose: Secondary | ICD-10-CM | POA: Diagnosis not present

## 2022-01-04 DIAGNOSIS — E039 Hypothyroidism, unspecified: Secondary | ICD-10-CM | POA: Diagnosis not present

## 2022-01-07 DIAGNOSIS — I739 Peripheral vascular disease, unspecified: Secondary | ICD-10-CM | POA: Diagnosis not present

## 2022-01-11 DIAGNOSIS — Z853 Personal history of malignant neoplasm of breast: Secondary | ICD-10-CM | POA: Diagnosis not present

## 2022-01-11 DIAGNOSIS — M179 Osteoarthritis of knee, unspecified: Secondary | ICD-10-CM | POA: Diagnosis not present

## 2022-01-11 DIAGNOSIS — L608 Other nail disorders: Secondary | ICD-10-CM | POA: Diagnosis not present

## 2022-01-11 DIAGNOSIS — I10 Essential (primary) hypertension: Secondary | ICD-10-CM | POA: Diagnosis not present

## 2022-01-11 DIAGNOSIS — I2510 Atherosclerotic heart disease of native coronary artery without angina pectoris: Secondary | ICD-10-CM | POA: Diagnosis not present

## 2022-01-11 DIAGNOSIS — T7849XS Other allergy, sequela: Secondary | ICD-10-CM | POA: Diagnosis not present

## 2022-01-11 DIAGNOSIS — E039 Hypothyroidism, unspecified: Secondary | ICD-10-CM | POA: Diagnosis not present

## 2022-01-11 DIAGNOSIS — E782 Mixed hyperlipidemia: Secondary | ICD-10-CM | POA: Diagnosis not present

## 2022-01-11 DIAGNOSIS — R7301 Impaired fasting glucose: Secondary | ICD-10-CM | POA: Diagnosis not present

## 2022-01-11 DIAGNOSIS — M5136 Other intervertebral disc degeneration, lumbar region: Secondary | ICD-10-CM | POA: Diagnosis not present

## 2022-01-11 DIAGNOSIS — M25551 Pain in right hip: Secondary | ICD-10-CM | POA: Diagnosis not present

## 2022-01-11 DIAGNOSIS — K7689 Other specified diseases of liver: Secondary | ICD-10-CM | POA: Diagnosis not present

## 2022-01-18 DIAGNOSIS — L0100 Impetigo, unspecified: Secondary | ICD-10-CM | POA: Diagnosis not present

## 2022-01-18 DIAGNOSIS — L280 Lichen simplex chronicus: Secondary | ICD-10-CM | POA: Diagnosis not present

## 2022-01-18 DIAGNOSIS — L570 Actinic keratosis: Secondary | ICD-10-CM | POA: Diagnosis not present

## 2022-01-18 DIAGNOSIS — Z85828 Personal history of other malignant neoplasm of skin: Secondary | ICD-10-CM | POA: Diagnosis not present

## 2022-01-19 DIAGNOSIS — L0100 Impetigo, unspecified: Secondary | ICD-10-CM | POA: Diagnosis not present

## 2022-01-28 DIAGNOSIS — L309 Dermatitis, unspecified: Secondary | ICD-10-CM | POA: Diagnosis not present

## 2022-01-28 DIAGNOSIS — L0100 Impetigo, unspecified: Secondary | ICD-10-CM | POA: Diagnosis not present

## 2022-02-01 DIAGNOSIS — H9209 Otalgia, unspecified ear: Secondary | ICD-10-CM | POA: Diagnosis not present

## 2022-02-02 ENCOUNTER — Encounter (INDEPENDENT_AMBULATORY_CARE_PROVIDER_SITE_OTHER): Payer: Self-pay | Admitting: Internal Medicine

## 2022-02-02 ENCOUNTER — Ambulatory Visit (INDEPENDENT_AMBULATORY_CARE_PROVIDER_SITE_OTHER): Payer: Medicare HMO | Admitting: Internal Medicine

## 2022-02-02 DIAGNOSIS — K5731 Diverticulosis of large intestine without perforation or abscess with bleeding: Secondary | ICD-10-CM

## 2022-02-02 MED ORDER — POLYETHYLENE GLYCOL 3350 17 G PO PACK: 8.5000 g | PACK | ORAL | Status: DC

## 2022-02-02 NOTE — Patient Instructions (Signed)
Physician will call with results of blood work Report to ER if bleeding starts again and you feel lightheaded. No aspirin for 5 days

## 2022-02-02 NOTE — Progress Notes (Signed)
Presenting complaint;  Rectal bleeding.  Database and subjective:  Patient is 76 year old Caucasian female who called earlier today and requested to be seen. She has been passing bright red blood per rectum since yesterday.  This morning she also passed a couple of clots.  She had mild lower abdominal pain.  She did vomit once last night.  She feels vomiting possibly related to doxycycline that she is taking for skin issues. 's last night when she passed blood she felt cold clammy and dizzy but the symptoms resolved after of 45 minutes.  She denies fever or chills.  She is prone to constipation but lately she has had 3-4 stools per day. She underwent colonoscopy by Dr. Jenetta Downer on 09/18/2021 for rectal bleeding.  She had small tubular adenoma removed from the transverse colon.  She had large sigmoid colon diverticula and external hemorrhoids without stigmata of bleeding. Patient says her appetite is good.  She feels her weight is close to her baseline.  She is on low-dose aspirin daily and she also takes meloxicam but no more than twice a month. She says methotrexate dose has not been changed recently.  Current Medications: Outpatient Encounter Medications as of 02/02/2022  Medication Sig   Acetaminophen (ACETAMINOPHEN EXTRA STRENGTH) 500 MG capsule Take 1,000 mg by mouth every 6 (six) hours as needed for pain.   acetaminophen (TYLENOL) 650 MG CR tablet Take 1,300 mg by mouth every 8 (eight) hours as needed for pain.   Ascorbic Acid (VITAMIN C) 1000 MG tablet Take 1,000 mg by mouth daily.   aspirin EC 81 MG tablet Take 81 mg by mouth daily. Swallow whole.   CALCIUM CITRATE-VITAMIN D PO Take 1 tablet by mouth daily.    Cholecalciferol (VITAMIN D3) 50 MCG (2000 UT) capsule Take 2,000 Units by mouth 2 (two) times daily.   ciprofloxacin-dexamethasone (CIPRODEX) OTIC suspension SMARTSIG:In Ear(s)   clobetasol ointment (TEMOVATE) 7.82 % Apply 1 application topically 2 (two) times daily. Do not use  longer than two weeks at a time. (Patient taking differently: Apply 1 application. topically 2 (two) times daily as needed (itching). Do not use longer than two weeks at a time.)   famotidine (PEPCID) 20 MG tablet Take 20 mg by mouth at bedtime.   fluticasone (FLONASE) 50 MCG/ACT nasal spray Place 2 sprays into both nostrils at bedtime as needed for allergies.   folic acid (FOLVITE) 1 MG tablet Take 1 mg by mouth daily.   GLUCOSAMINE-CHONDROITIN PO Take 1 tablet by mouth 2 (two) times daily.    hydrocortisone (ANUSOL-HC) 2.5 % rectal cream Place 1 application rectally 2 (two) times daily. Twice a day for 7 days then as needed thereafter   hydrOXYzine (ATARAX) 25 MG tablet Take 1 tablet (25 mg total) by mouth at bedtime as needed.   hydrOXYzine (VISTARIL) 25 MG capsule Take 25 mg by mouth every 6 (six) hours as needed for itching.   ketoconazole (NIZORAL) 2 % cream Apply 1 application topically daily as needed for irritation.   levothyroxine (SYNTHROID) 150 MCG tablet Take 150 mcg by mouth daily.   lisinopril (ZESTRIL) 20 MG tablet Take 20 mg by mouth daily.   Magnesium 250 MG TABS Take 250 mg by mouth daily.    meloxicam (MOBIC) 7.5 MG tablet Take 7.5 mg by mouth daily as needed for pain.   methocarbamol (ROBAXIN) 500 MG tablet Take 500 mg by mouth at bedtime as needed for muscle spasms.   methotrexate (RHEUMATREX) 2.5 MG tablet Take 10 mg by  mouth every Monday. Caution:Chemotherapy. Protect from light.   montelukast (SINGULAIR) 10 MG tablet Take 1 tablet (10 mg total) by mouth at bedtime.   Omega-3 1000 MG CAPS Take 1,000 mg by mouth 2 (two) times daily.   rosuvastatin (CRESTOR) 10 MG tablet Take 10 mg by mouth daily.   silver sulfADIAZINE (SILVADENE) 1 % cream Apply 1 application topically daily.   triamcinolone ointment (KENALOG) 0.1 % Apply 1 application topically 2 (two) times daily. (Patient taking differently: Apply 1 application. topically daily as needed (dry/itchy skin).)   Turmeric 500  MG CAPS Take 500 mg by mouth daily.   vitamin B-12 (CYANOCOBALAMIN) 500 MCG tablet Take 500 mcg by mouth daily.   zinc gluconate 50 MG tablet Take 50 mg by mouth daily.   polyethylene glycol (MIRALAX / GLYCOLAX) 17 g packet Take 17 g by mouth daily as needed for moderate constipation. (Patient not taking: Reported on 02/02/2022)   [DISCONTINUED] Polyethyl Glycol-Propyl Glycol (SYSTANE OP) Place 1 drop into both eyes 3 (three) times daily as needed (dry eyes).   No facility-administered encounter medications on file as of 02/02/2022.     Objective: Blood pressure 133/81, pulse 71/min, temperature 99.1 F (37.3 C), temperature source Oral, height _0  (1.575 m), weight 175 lb 11.2 oz (79.7 kg). Patient is alert and in no acute distress. Conjunctiva is pink. Sclera is nonicteric Oropharyngeal mucosa is normal. No neck masses or thyromegaly noted. Cardiac exam with regular rhythm normal S1 and S2. No murmur or gallop noted. Lungs are clear to auscultation. Abdomen is symmetrical soft and nontender with organomegaly or masses. No LE edema or clubbing noted.  Labs/studies Results:      Latest Ref Rng & Units 08/16/2021    8:25 AM 10/20/2020    9:31 AM 05/26/2019    6:02 PM  CBC  WBC 4.0 - 10.5 K/uL 6.8   7.6   12.0    Hemoglobin 12.0 - 15.0 g/dL 13.6   14.4   13.4    Hematocrit 36.0 - 46.0 % 42.3   43.6   43.2    Platelets 150 - 400 K/uL 277    288         Latest Ref Rng & Units 08/16/2021    8:25 AM 05/26/2019    6:02 PM 05/17/2019   10:03 AM  CMP  Glucose 70 - 99 mg/dL 114   113   85    BUN 8 - 23 mg/dL _1 Creatinine 0.44 - 1.00 mg/dL 0.75   0.66   0.76    Sodium 135 - 145 mmol/L 138   138   137    Potassium 3.5 - 5.1 mmol/L 4.2   4.1   4.0    Chloride 98 - 111 mmol/L 101   101   100    CO2 22 - 32 mmol/L _2 Calcium 8.9 - 10.3 mg/dL 9.6   9.0   9.0    Total Protein 6.5 - 8.1 g/dL 8.5      Total Bilirubin 0.3 - 1.2 mg/dL 0.7      Alkaline Phos 38 -  126 U/L 76      AST 15 - 41 U/L 39      ALT 0 - 44 U/L 27           Latest Ref Rng & Units 08/16/2021    8:25 AM 01/08/2010  6:31 PM 11/27/2009   12:00 AM  Hepatic Function  Total Protein 6.5 - 8.1 g/dL 8.5   6.8   8.0    Albumin 3.5 - 5.0 g/dL 4.2   3.9   4.4    AST 15 - 41 U/L 39   32   55    ALT 0 - 44 U/L 27   45   52    Alk Phosphatase 38 - 126 U/L 76   75   87    Total Bilirubin 0.3 - 1.2 mg/dL 0.7   0.4     Bilirubin, Direct mg/dL   total bili  0.5       Assessment:  #1.  Rectal bleeding.  Patient presents with large volume hematochezia.  Her abdominal examination is unremarkable.  Colonoscopy in January 2023 revealed sigmoid colon diverticulosis and small external hemorrhoids.  Her presentation is typical for diverticular bleed.  She is hemodynamically stable.  #2.  History of constipation.  She will take polyethylene glycol on schedule at a low dose so that she can prevent constipation.  Plan:  Patient advised to stay on full liquids for the next 24 hours. Hold aspirin for 5 days. Polyethylene glycol 8.5 g every other day.  She can titrate the dose in order to prevent constipation. She will go to the lab for CBC. If rectal bleeding accelerates or if she develops postural symptoms she should report to the emergency room. Office visit on as-needed basis. Dr. Jenetta Downer recommended next colonoscopy in January 2030.

## 2022-02-05 ENCOUNTER — Telehealth (INDEPENDENT_AMBULATORY_CARE_PROVIDER_SITE_OTHER): Payer: Self-pay | Admitting: *Deleted

## 2022-02-05 NOTE — Telephone Encounter (Signed)
Dr Laural Golden reviewed pt's labs that was faxed over from dr [facility] office. Hemoglobin 14.3. Per dr Laural Golden tell patient hemoglobin is good and ask if she is still bleeding.   I called patient and let her know her hemoglobin was good. And asked if she was still bleeding. She states the bleeding had started to taper off and was just seeing a minor amount that she could hardly see.   Per dr Laural Golden - take metamucil fiber 4 grams daily and call next week with update.  Pt verbalized understanding of all and I let her know I would call and get an update.

## 2022-02-09 NOTE — Telephone Encounter (Signed)
Bleeding has stopped and has not seen any in the past 3 -4 days.   Took off aspirin 81 mg and wants to know if she should go back on aspirin. She thinks it was for 5 days.   Does she continue the metamucil teaspoon in a drink daily. Has not been doing miralax since starting metamucil. Doing about the same since starting metamucil. Does not want to start the bleed again and wants to do what Dr. Laural Golden thinks is best.

## 2022-02-10 DIAGNOSIS — H43813 Vitreous degeneration, bilateral: Secondary | ICD-10-CM | POA: Diagnosis not present

## 2022-02-10 DIAGNOSIS — H40013 Open angle with borderline findings, low risk, bilateral: Secondary | ICD-10-CM | POA: Diagnosis not present

## 2022-02-10 DIAGNOSIS — H35371 Puckering of macula, right eye: Secondary | ICD-10-CM | POA: Diagnosis not present

## 2022-02-10 DIAGNOSIS — Z961 Presence of intraocular lens: Secondary | ICD-10-CM | POA: Diagnosis not present

## 2022-02-10 NOTE — Telephone Encounter (Signed)
Call after 2

## 2022-02-10 NOTE — Telephone Encounter (Signed)
Per Dr. Laural Golden may resume aspirin. Was only suppose to hold for 5 days. And take one tablespoon of  metamucil every day long term and use miralax if needed for constipation.  I discussed with patient and she verbalized understanding.

## 2022-02-11 ENCOUNTER — Ambulatory Visit (HOSPITAL_COMMUNITY)
Admission: RE | Admit: 2022-02-11 | Discharge: 2022-02-11 | Disposition: A | Discharge status: Home / Self Care | Payer: Medicare HMO | Source: Ambulatory Visit | Attending: Family Medicine | Admitting: Family Medicine

## 2022-02-11 ENCOUNTER — Other Ambulatory Visit (HOSPITAL_COMMUNITY): Payer: Self-pay | Admitting: Family Medicine

## 2022-02-11 DIAGNOSIS — M542 Cervicalgia: Secondary | ICD-10-CM | POA: Insufficient documentation

## 2022-02-19 ENCOUNTER — Other Ambulatory Visit (HOSPITAL_COMMUNITY): Payer: Self-pay | Admitting: Family Medicine

## 2022-02-19 ENCOUNTER — Other Ambulatory Visit: Payer: Self-pay | Admitting: Family Medicine

## 2022-02-19 DIAGNOSIS — I6529 Occlusion and stenosis of unspecified carotid artery: Secondary | ICD-10-CM

## 2022-03-01 ENCOUNTER — Ambulatory Visit (HOSPITAL_COMMUNITY)
Admission: RE | Admit: 2022-03-01 | Discharge: 2022-03-01 | Disposition: A | Discharge status: Home / Self Care | Payer: Medicare HMO | Source: Ambulatory Visit | Attending: Family Medicine | Admitting: Family Medicine

## 2022-03-01 DIAGNOSIS — I6529 Occlusion and stenosis of unspecified carotid artery: Secondary | ICD-10-CM | POA: Diagnosis not present

## 2022-03-01 DIAGNOSIS — I771 Stricture of artery: Secondary | ICD-10-CM | POA: Diagnosis not present

## 2022-03-01 DIAGNOSIS — I6523 Occlusion and stenosis of bilateral carotid arteries: Secondary | ICD-10-CM | POA: Diagnosis not present

## 2022-03-04 DIAGNOSIS — I2510 Atherosclerotic heart disease of native coronary artery without angina pectoris: Secondary | ICD-10-CM | POA: Diagnosis not present

## 2022-03-16 DIAGNOSIS — I739 Peripheral vascular disease, unspecified: Secondary | ICD-10-CM | POA: Diagnosis not present

## 2022-03-18 DIAGNOSIS — M25551 Pain in right hip: Secondary | ICD-10-CM | POA: Diagnosis not present

## 2022-03-18 DIAGNOSIS — M5441 Lumbago with sciatica, right side: Secondary | ICD-10-CM | POA: Diagnosis not present

## 2022-03-18 DIAGNOSIS — R220 Localized swelling, mass and lump, head: Secondary | ICD-10-CM | POA: Diagnosis not present

## 2022-03-18 DIAGNOSIS — E669 Obesity, unspecified: Secondary | ICD-10-CM | POA: Diagnosis not present

## 2022-03-18 DIAGNOSIS — M810 Age-related osteoporosis without current pathological fracture: Secondary | ICD-10-CM | POA: Diagnosis not present

## 2022-03-18 DIAGNOSIS — L4059 Other psoriatic arthropathy: Secondary | ICD-10-CM | POA: Diagnosis not present

## 2022-03-18 DIAGNOSIS — M25561 Pain in right knee: Secondary | ICD-10-CM | POA: Diagnosis not present

## 2022-03-18 DIAGNOSIS — M5030 Other cervical disc degeneration, unspecified cervical region: Secondary | ICD-10-CM | POA: Diagnosis not present

## 2022-03-18 DIAGNOSIS — Z6830 Body mass index (BMI) 30.0-30.9, adult: Secondary | ICD-10-CM | POA: Diagnosis not present

## 2022-04-15 ENCOUNTER — Ambulatory Visit (HOSPITAL_COMMUNITY)
Admission: RE | Admit: 2022-04-15 | Discharge: 2022-04-15 | Disposition: A | Discharge status: Home / Self Care | Payer: Medicare HMO | Source: Ambulatory Visit | Attending: Internal Medicine | Admitting: Internal Medicine

## 2022-04-15 DIAGNOSIS — Z1231 Encounter for screening mammogram for malignant neoplasm of breast: Secondary | ICD-10-CM | POA: Diagnosis not present

## 2022-04-16 ENCOUNTER — Ambulatory Visit: Payer: Medicare HMO | Admitting: Allergy & Immunology

## 2022-04-21 ENCOUNTER — Ambulatory Visit: Payer: Medicare HMO | Admitting: Allergy & Immunology

## 2022-04-21 ENCOUNTER — Encounter: Payer: Self-pay | Admitting: Allergy & Immunology

## 2022-04-21 VITALS — BP 136/80 | HR 66 | Temp 98.7°F | Resp 18 | Ht 63.0 in | Wt 182.0 lb

## 2022-04-21 DIAGNOSIS — J302 Other seasonal allergic rhinitis: Secondary | ICD-10-CM

## 2022-04-21 DIAGNOSIS — L299 Pruritus, unspecified: Secondary | ICD-10-CM

## 2022-04-21 DIAGNOSIS — L2089 Other atopic dermatitis: Secondary | ICD-10-CM

## 2022-04-21 DIAGNOSIS — J3089 Other allergic rhinitis: Secondary | ICD-10-CM | POA: Diagnosis not present

## 2022-04-21 NOTE — Patient Instructions (Addendum)
1. Itching with sensitizations to grasses, indoor molds, dust mites - Continue with Allegra (fexofenadine) '180mg'$  as needed. - Continue with the methotrexate from Dr. Amil Amen.   2. Eczema - Continue with moisturizing twice daily as as you are doing. - Continue with triamcinolone 0.1% ointment (LARGE TUB) for the lesions on your thighs and elbows.  - CONTINUE WITH clobetasol 0.05% ointment twice daily for TWO WEEKS at a time (can use with thick skinned areas, avoiding the face, fingers, axilla, and groin).  3. Yeast infection under breast - You can use the ketoconazole under the breast to treat the yeast infection as well. - This can cover Candidal infections as well. - Call us [facility] if this is not working and clearing it up.   4. Return in about 6 months (around 10/22/2022).    Please inform us of any Emergency Department visits, hospitalizations, or changes in symptoms. Call us before going to the ED for breathing or allergy symptoms since we might be able to fit you in for a sick visit. Feel free to contact us anytime with any questions, problems, or concerns.  It was a pleasure to see you again today!  Websites that have reliable patient information: 1. American Academy of Asthma, Allergy, and Immunology: www.aaaai.org 2. Food Allergy Research and Education (FARE): foodallergy.org 3. Mothers of Asthmatics: http://www.asthmacommunitynetwork.org 4. American College of Allergy, Asthma, and Immunology: www.acaai.org   COVID-19 Vaccine Information can be found at: ShippingScam.co.uk For questions related to vaccine distribution or appointments, please email [email or web address] or call [phone].   We realize that you might be concerned about having an allergic reaction to the COVID19 vaccines. To help with that concern, WE ARE OFFERING THE COVID19 VACCINES IN OUR OFFICE! Ask the front desk for dates!     "Like" [place] on  Facebook and Instagram for our latest updates!      A healthy democracy works best when [employer] participate! Make sure you are registered to vote! If you have moved or changed any of your contact information, you will need to get this updated before voting!  In some cases, you MAY be able to register to vote online: [email or web address]

## 2022-04-21 NOTE — Progress Notes (Signed)
FOLLOW UP  Date of Service/Encounter:  04/21/22   Assessment:   Itching - with some clear environmental allergic triggers    Eczema - on triamcinolone ointment BID PRN (once again declined Dupixent today)   Perennial and seasonal allergic rhinitis (grasses, indoor molds and dust mites)   Elevated absolute eosinophil count of 700 (April 2019)   Turtle lover  Plan/Recommendations:   1. Itching with sensitizations to grasses, indoor molds, dust mites - Continue with Allegra (fexofenadine) '180mg'$  as needed. - Continue with the methotrexate from Dr. Amil Amen.   2. Eczema - Continue with moisturizing twice daily as as you are doing. - Continue with triamcinolone 0.1% ointment (LARGE TUB) for the lesions on your thighs and elbows.  - CONTINUE WITH clobetasol 0.05% ointment twice daily for TWO WEEKS at a time (can use with thick skinned areas, avoiding the face, fingers, axilla, and groin).  3. Yeast infection under breast - You can use the ketoconazole under the breast to treat the yeast infection as well. - This can cover Candidal infections as well. - Call us [facility] if this is not working and clearing it up.   4. Return in about 6 months (around 10/22/2022).    Subjective:   Maria Ritter is a 76 y.o. female presenting today for follow up of  Chief Complaint  Patient presents with   Pruritus    Has some itching on thighs comes and goes    Rash    Has a rash on her left arm that comes and goes.   Allergic Rhinitis     Wakes up in the morning have to clear up her throat, drainage     Maria Ritter has a history of the following: Patient Active Problem List   Diagnosis Date Noted   Diverticulosis of colon with hemorrhage 02/02/2022   Hemorrhoids 09/04/2021   Constipation 09/04/2021   Rectal bleeding 09/04/2021   Encounter for screening fecal occult blood testing 06/17/2021   Encounter for well woman exam with routine gynecological exam 06/17/2021   Abnormal  stress test 08/02/2019   Postinflammatory skin changes 06/14/2019   Encounter for colorectal cancer screening 06/14/2019   Encounter for gynecological examination with Papanicolaou smear of cervix 06/14/2019   Lymph node enlargement    Flexural atopic dermatitis 01/31/2018   Itching 11/01/2017   Seasonal and perennial allergic rhinitis 11/01/2017   History of breast cancer in female 04/21/2016   Routine cervical smear 04/21/2016   Vaginal atrophy 04/21/2016   OVERWEIGHT 01/28/2010   LEG CRAMPS, NOCTURNAL 01/28/2010   ELECTROCARDIOGRAM, ABNORMAL 01/28/2010   CARCINOMA IN SITU, RIGHT BREAST 11/27/2009   HYPERLIPIDEMIA 11/27/2009   ARM PAIN, LEFT 11/27/2009   HYPOTHYROIDISM 11/26/2009   Essential hypertension 11/26/2009    History obtained from: chart review and patient.  Maria Ritter is a 76 y.o. female presenting for a follow up visit.  She was last seen in February 2023.  At that time, we continue with Allegra 180 mg up to twice daily as well as Singulair 10 mg at night.  We also continue with hydroxyzine as needed.  For her eczema, she continue with moisturizing.  We also continue with triamcinolone twice a day as needed.  We continue with clobetasol as needed.  Since last visit, she has done well. She is down to five turtles.  They all seem to be doing fairly well.  Since the last visit, she has continued to have worsening nighttime itching. Dr. Amil Amen her rheumatologist in [place] put her  on MTX. She gets tests every 3 months or so to monitor labs for this. But since she has been on that, which she takes once weekly, that seems to have helped the itching at night. She will take an Allegra if needed. She has not been on the montelukast or the famotidine. She has been on MTX for less than one year. It has helpe somewhat with her rheumatologic issues. She has had a knee flare up for a month or so, but this has calmed down.   She has itching on her thigh and her elbows. She has been  having worsening symptoms with the hot weather. She went to talk to the Pharmacist at [facility] about her itching. She has some redness around the breast area. She was diagnosed with a yeast infection which is based on her symptoms. She was told to use two medications to help treat it including hydrocortisone 1% and clotrimazole.  She has been doing this regimen for about 2 or 3 days with some improvement although no complete resolution.  Allergic rhinitis symptoms are fairly mild.  We have never really treated these aggressively.  Otherwise, there have been no changes to her past medical history, surgical history, family history, or social history.    Review of Systems  Constitutional: Negative.  Negative for chills, fever, malaise/fatigue and weight loss.  HENT: Negative.  Negative for congestion, ear discharge and ear pain.   Eyes:  Negative for pain, discharge and redness.  Respiratory:  Negative for cough, sputum production, shortness of breath and wheezing.   Cardiovascular: Negative.  Negative for chest pain and palpitations.  Gastrointestinal:  Negative for abdominal pain, diarrhea, heartburn, nausea and vomiting.  Skin: Negative.  Negative for itching and rash.  Neurological:  Negative for dizziness and headaches.  Endo/Heme/Allergies:  Negative for environmental allergies. Does not bruise/bleed easily.       Objective:   Blood pressure 136/80, pulse 66, temperature 98.7 F (37.1 C), resp. rate 18, height '5\' 3"'$  (1.6 m), weight 182 lb (82.6 kg), SpO2 96 %. Body mass index is 32.24 kg/m.    Physical Exam Vitals reviewed.  Constitutional:      Appearance: She is well-developed.  HENT:     Head: Normocephalic and atraumatic.     Right Ear: Tympanic membrane, ear canal and external ear normal.     Left Ear: Tympanic membrane, ear canal and external ear normal.     Nose: No nasal deformity, septal deviation, mucosal edema or rhinorrhea.     Right Turbinates:  Enlarged, swollen and pale.     Left Turbinates: Enlarged, swollen and pale.     Right Sinus: No maxillary sinus tenderness or frontal sinus tenderness.     Left Sinus: No maxillary sinus tenderness or frontal sinus tenderness.     Comments: No nasal polyps.    Mouth/Throat:     Mouth: Mucous membranes are not pale and not dry.     Pharynx: Uvula midline.  Eyes:     General: Lids are normal. No allergic shiner.       Right eye: No discharge.        Left eye: No discharge.     Conjunctiva/sclera: Conjunctivae normal.     Right eye: Right conjunctiva is not injected. No chemosis.    Left eye: Left conjunctiva is not injected. No chemosis.    Pupils: Pupils are equal, round, and reactive to light.  Cardiovascular:     Rate and Rhythm: Normal rate and  regular rhythm.     Heart sounds: Normal heart sounds.  Pulmonary:     Effort: Pulmonary effort is normal. No tachypnea, accessory muscle usage or respiratory distress.     Breath sounds: Normal breath sounds. No wheezing, rhonchi or rales.     Comments: Moving air well in all lung fields. Chest:     Chest wall: No tenderness.  Lymphadenopathy:     Cervical: No cervical adenopathy.  Skin:    General: Skin is warm.     Capillary Refill: Capillary refill takes less than 2 seconds.     Coloration: Skin is not pale.     Findings: Rash present. No abrasion, erythema or petechiae. Rash is crusting and papular. Rash is not vesicular.     Comments: Eczematous lesions are improving.  She does have an eschar on the left arm.  She also has some eczematous flares in her antecubital fossa.  Neurological:     Mental Status: She is alert.  Psychiatric:        Behavior: Behavior is cooperative.      Diagnostic studies: none      Salvatore Marvel, MD  [facility]

## 2022-04-22 MED ORDER — TRIAMCINOLONE ACETONIDE 0.1 % EX OINT: 1.0000 | TOPICAL_OINTMENT | Freq: Two times a day (BID) | CUTANEOUS | 1 refills | Status: AC

## 2022-04-28 ENCOUNTER — Other Ambulatory Visit (HOSPITAL_COMMUNITY): Payer: Self-pay | Admitting: Internal Medicine

## 2022-04-28 DIAGNOSIS — Z1231 Encounter for screening mammogram for malignant neoplasm of breast: Secondary | ICD-10-CM

## 2022-05-14 DIAGNOSIS — Z23 Encounter for immunization: Secondary | ICD-10-CM | POA: Diagnosis not present

## 2022-05-25 DIAGNOSIS — I739 Peripheral vascular disease, unspecified: Secondary | ICD-10-CM | POA: Diagnosis not present

## 2022-06-12 IMAGING — DX DG SHOULDER 2+V*L*
3 series · 3 of 3 positions shown · non-contrast
Comparison: None.

CLINICAL DATA: Fall approximally 5 steps with shoulder pain.

EXAM:
LEFT SHOULDER - 2+ VIEW

[shoulder grashey]
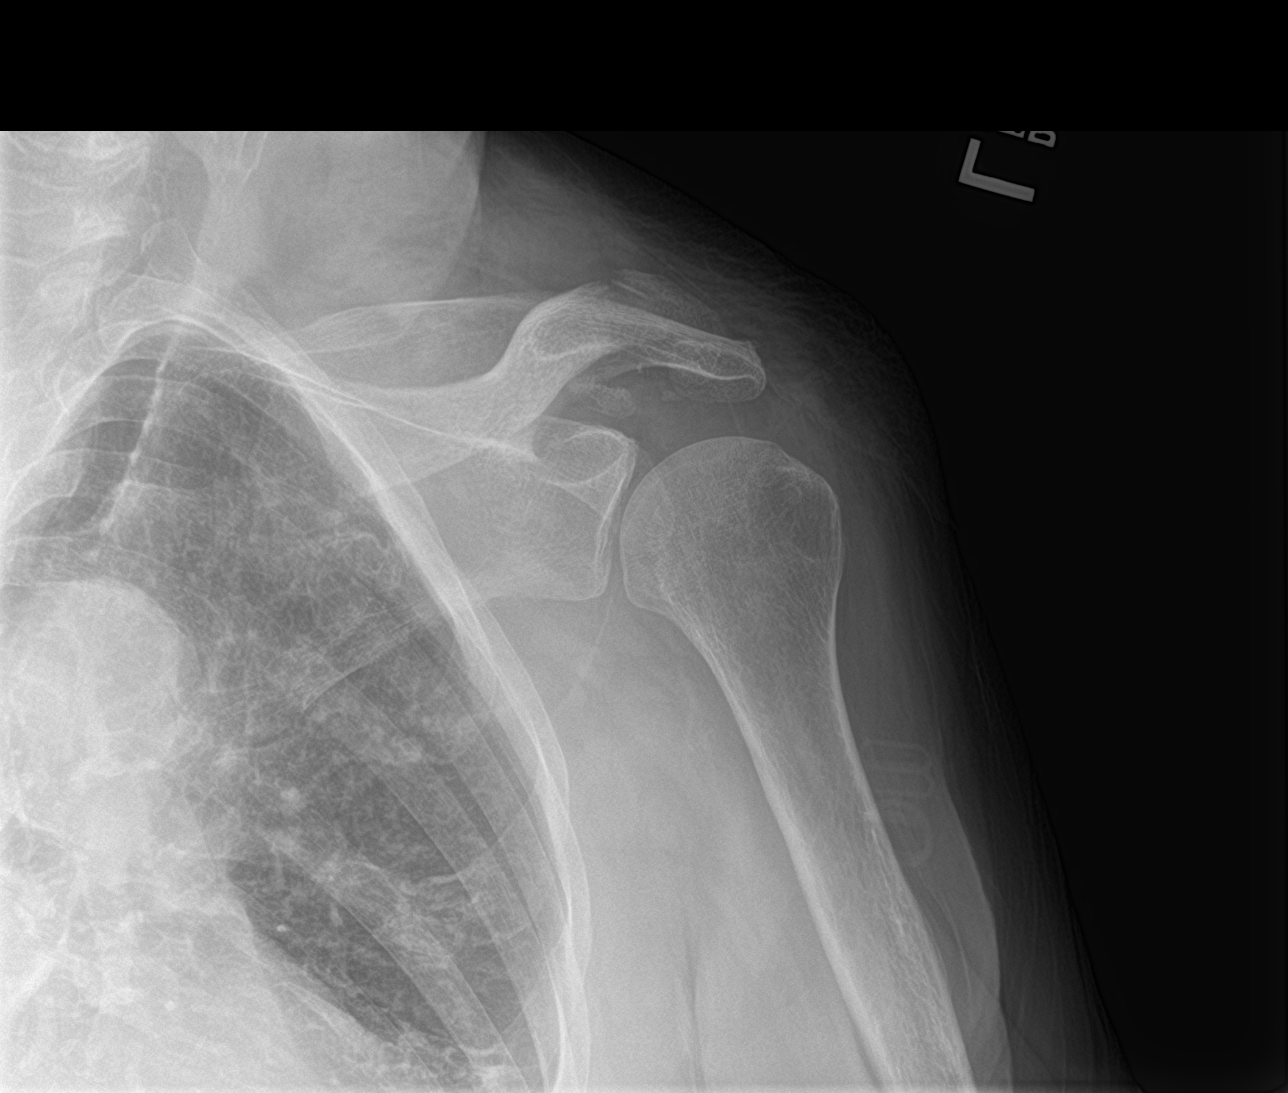

[shoulder y view]
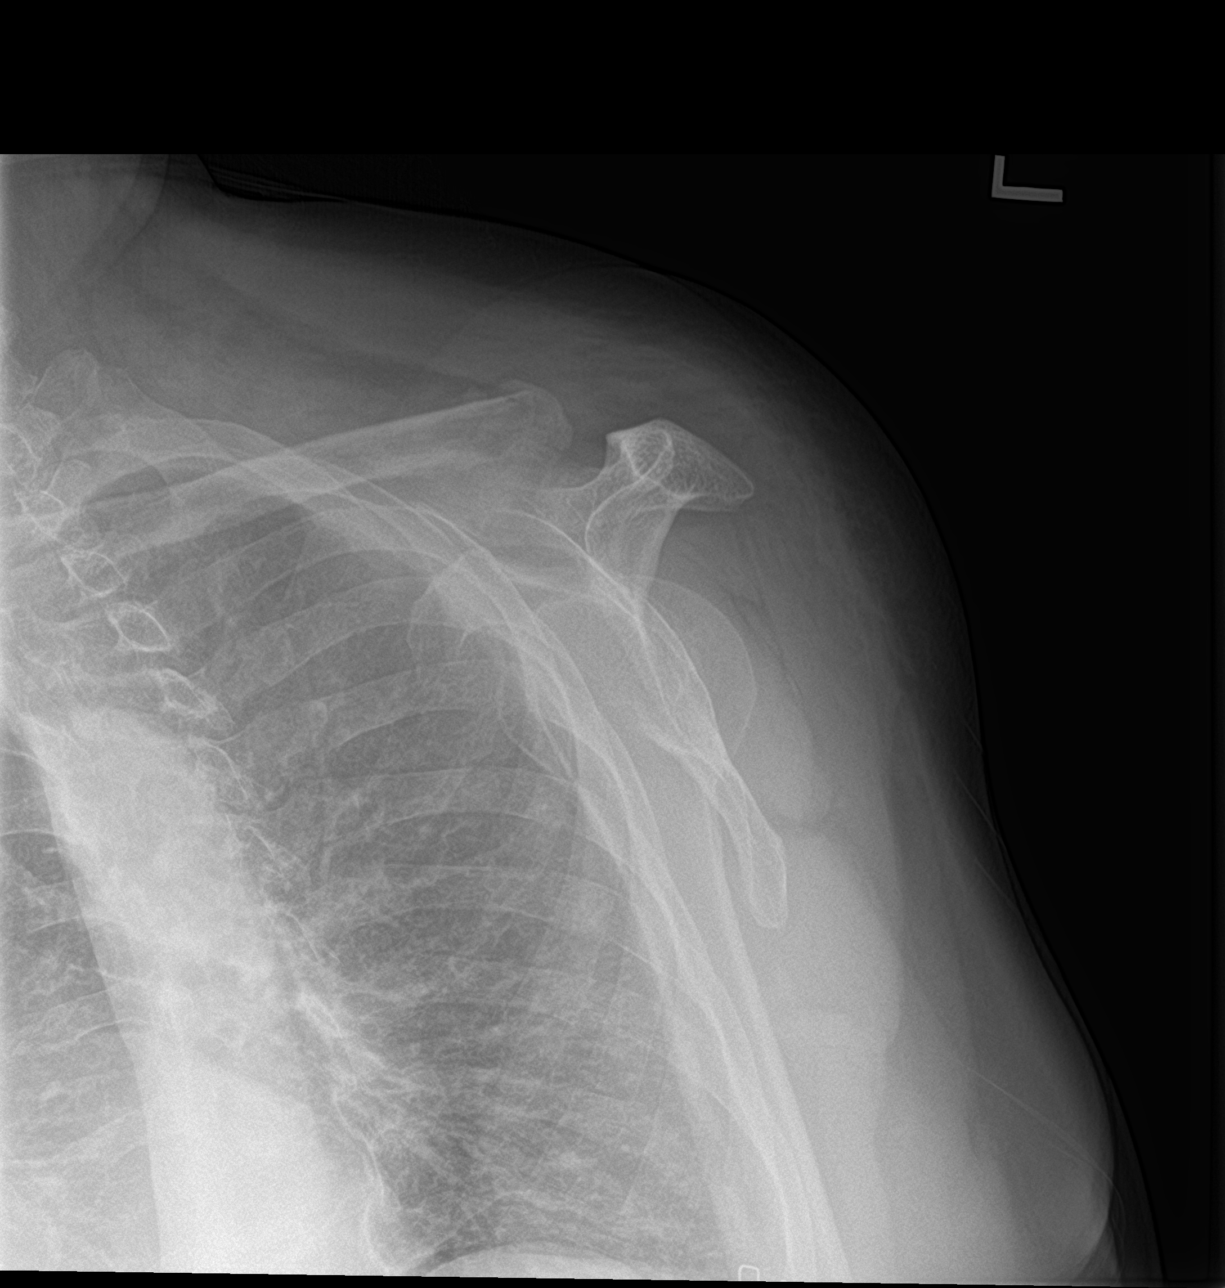

[shoulder axillary]
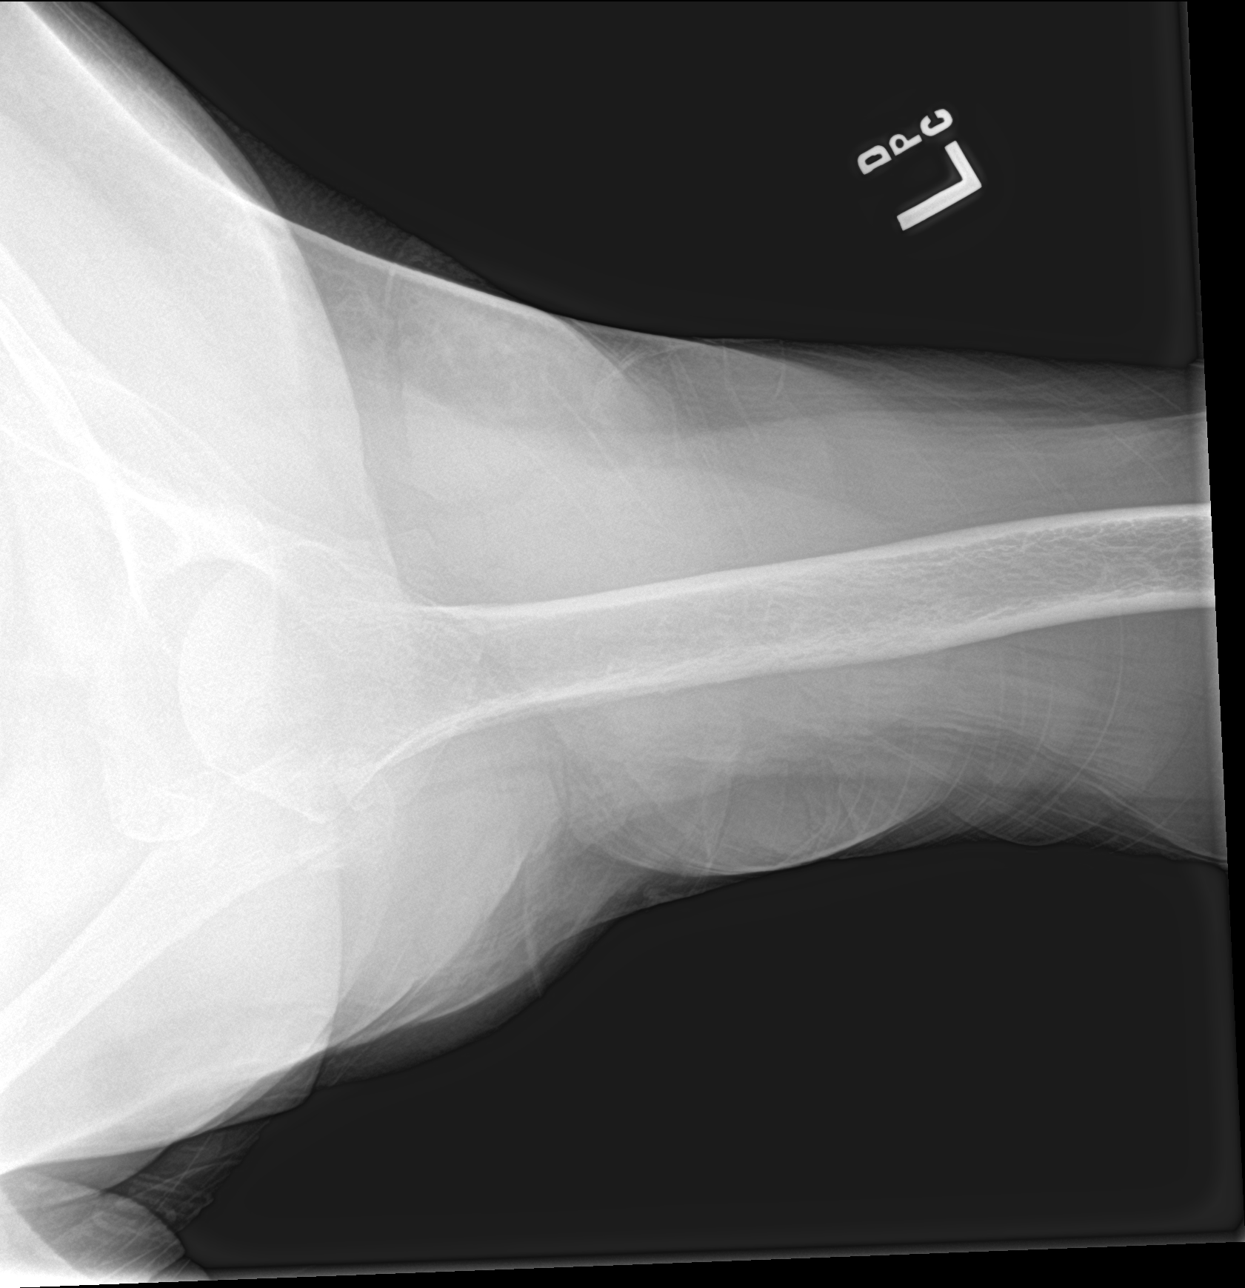

[3 of 3 positions shown; findings below may reference images not displayed]

FINDINGS: There is a comminuted fracture of the distal clavicle. The under
surface of the clavicle appears well aligned with the a chromium,
however the acromioclavicular distance appears increased which may
be a consequence of the fracture. There is no dislocation of the
glenohumeral joint. There is associated soft tissue swelling.
IMPRESSION: Comminuted distal left clavicle fracture.

## 2022-07-06 DIAGNOSIS — E039 Hypothyroidism, unspecified: Secondary | ICD-10-CM | POA: Diagnosis not present

## 2022-07-06 DIAGNOSIS — R7301 Impaired fasting glucose: Secondary | ICD-10-CM | POA: Diagnosis not present

## 2022-07-06 DIAGNOSIS — E782 Mixed hyperlipidemia: Secondary | ICD-10-CM | POA: Diagnosis not present

## 2022-07-06 DIAGNOSIS — E559 Vitamin D deficiency, unspecified: Secondary | ICD-10-CM | POA: Diagnosis not present

## 2022-07-07 LAB — COMPREHENSIVE METABOLIC PANEL: EGFR: 81

## 2022-07-13 DIAGNOSIS — I10 Essential (primary) hypertension: Secondary | ICD-10-CM | POA: Diagnosis not present

## 2022-07-13 DIAGNOSIS — E782 Mixed hyperlipidemia: Secondary | ICD-10-CM | POA: Diagnosis not present

## 2022-07-13 DIAGNOSIS — M179 Osteoarthritis of knee, unspecified: Secondary | ICD-10-CM | POA: Diagnosis not present

## 2022-07-13 DIAGNOSIS — M5136 Other intervertebral disc degeneration, lumbar region: Secondary | ICD-10-CM | POA: Diagnosis not present

## 2022-07-13 DIAGNOSIS — E669 Obesity, unspecified: Secondary | ICD-10-CM | POA: Diagnosis not present

## 2022-07-13 DIAGNOSIS — R7301 Impaired fasting glucose: Secondary | ICD-10-CM | POA: Diagnosis not present

## 2022-07-13 DIAGNOSIS — I2510 Atherosclerotic heart disease of native coronary artery without angina pectoris: Secondary | ICD-10-CM | POA: Diagnosis not present

## 2022-07-13 DIAGNOSIS — E039 Hypothyroidism, unspecified: Secondary | ICD-10-CM | POA: Diagnosis not present

## 2022-07-13 DIAGNOSIS — L608 Other nail disorders: Secondary | ICD-10-CM | POA: Diagnosis not present

## 2022-07-13 DIAGNOSIS — M25551 Pain in right hip: Secondary | ICD-10-CM | POA: Diagnosis not present

## 2022-07-13 DIAGNOSIS — K7689 Other specified diseases of liver: Secondary | ICD-10-CM | POA: Diagnosis not present

## 2022-07-13 DIAGNOSIS — Z853 Personal history of malignant neoplasm of breast: Secondary | ICD-10-CM | POA: Diagnosis not present

## 2022-07-19 DIAGNOSIS — D239 Other benign neoplasm of skin, unspecified: Secondary | ICD-10-CM | POA: Diagnosis not present

## 2022-07-19 DIAGNOSIS — Z1283 Encounter for screening for malignant neoplasm of skin: Secondary | ICD-10-CM | POA: Diagnosis not present

## 2022-07-19 DIAGNOSIS — L570 Actinic keratosis: Secondary | ICD-10-CM | POA: Diagnosis not present

## 2022-07-19 DIAGNOSIS — Z85828 Personal history of other malignant neoplasm of skin: Secondary | ICD-10-CM | POA: Diagnosis not present

## 2022-07-19 DIAGNOSIS — L304 Erythema intertrigo: Secondary | ICD-10-CM | POA: Diagnosis not present

## 2022-08-10 DIAGNOSIS — I739 Peripheral vascular disease, unspecified: Secondary | ICD-10-CM | POA: Diagnosis not present

## 2022-08-12 DIAGNOSIS — Z936 Other artificial openings of urinary tract status: Secondary | ICD-10-CM | POA: Diagnosis not present

## 2022-08-17 DIAGNOSIS — Z713 Dietary counseling and surveillance: Secondary | ICD-10-CM | POA: Diagnosis not present

## 2022-08-17 DIAGNOSIS — I10 Essential (primary) hypertension: Secondary | ICD-10-CM | POA: Diagnosis not present

## 2022-08-26 DIAGNOSIS — I10 Essential (primary) hypertension: Secondary | ICD-10-CM | POA: Diagnosis not present

## 2022-08-26 DIAGNOSIS — Z79899 Other long term (current) drug therapy: Secondary | ICD-10-CM | POA: Diagnosis not present

## 2022-09-02 DIAGNOSIS — I10 Essential (primary) hypertension: Secondary | ICD-10-CM | POA: Diagnosis not present

## 2022-09-20 DIAGNOSIS — M940 Chondrocostal junction syndrome [Tietze]: Secondary | ICD-10-CM | POA: Diagnosis not present

## 2022-09-24 ENCOUNTER — Telehealth: Payer: Self-pay

## 2022-09-24 NOTE — Telephone Encounter (Signed)
PA has been APPROVED from 09/24/2022-08/30/2023

## 2022-09-24 NOTE — Telephone Encounter (Signed)
PA request received via CMM for hydrOXYzine HCl '25MG'$  tablets  PA has been submitted to [facility] and is pending determination.   Key: [id number]

## 2022-09-30 ENCOUNTER — Other Ambulatory Visit (HOSPITAL_COMMUNITY): Payer: Self-pay | Admitting: Family Medicine

## 2022-09-30 DIAGNOSIS — R1011 Right upper quadrant pain: Secondary | ICD-10-CM

## 2022-10-04 DIAGNOSIS — W010XXD Fall on same level from slipping, tripping and stumbling without subsequent striking against object, subsequent encounter: Secondary | ICD-10-CM | POA: Diagnosis not present

## 2022-10-04 DIAGNOSIS — S41112A Laceration without foreign body of left upper arm, initial encounter: Secondary | ICD-10-CM | POA: Diagnosis not present

## 2022-10-04 DIAGNOSIS — S41102D Unspecified open wound of left upper arm, subsequent encounter: Secondary | ICD-10-CM | POA: Diagnosis not present

## 2022-10-08 DIAGNOSIS — S41112D Laceration without foreign body of left upper arm, subsequent encounter: Secondary | ICD-10-CM | POA: Diagnosis not present

## 2022-10-08 DIAGNOSIS — X58XXXD Exposure to other specified factors, subsequent encounter: Secondary | ICD-10-CM | POA: Diagnosis not present

## 2022-10-08 DIAGNOSIS — S41112A Laceration without foreign body of left upper arm, initial encounter: Secondary | ICD-10-CM | POA: Diagnosis not present

## 2022-10-11 DIAGNOSIS — M810 Age-related osteoporosis without current pathological fracture: Secondary | ICD-10-CM | POA: Diagnosis not present

## 2022-10-11 DIAGNOSIS — L4059 Other psoriatic arthropathy: Secondary | ICD-10-CM | POA: Diagnosis not present

## 2022-10-11 DIAGNOSIS — M5441 Lumbago with sciatica, right side: Secondary | ICD-10-CM | POA: Diagnosis not present

## 2022-10-11 DIAGNOSIS — R220 Localized swelling, mass and lump, head: Secondary | ICD-10-CM | POA: Diagnosis not present

## 2022-10-11 DIAGNOSIS — M5030 Other cervical disc degeneration, unspecified cervical region: Secondary | ICD-10-CM | POA: Diagnosis not present

## 2022-10-11 DIAGNOSIS — M25561 Pain in right knee: Secondary | ICD-10-CM | POA: Diagnosis not present

## 2022-10-11 DIAGNOSIS — Z6831 Body mass index (BMI) 31.0-31.9, adult: Secondary | ICD-10-CM | POA: Diagnosis not present

## 2022-10-11 DIAGNOSIS — R1011 Right upper quadrant pain: Secondary | ICD-10-CM | POA: Diagnosis not present

## 2022-10-11 DIAGNOSIS — M25551 Pain in right hip: Secondary | ICD-10-CM | POA: Diagnosis not present

## 2022-10-11 DIAGNOSIS — E669 Obesity, unspecified: Secondary | ICD-10-CM | POA: Diagnosis not present

## 2022-10-12 ENCOUNTER — Ambulatory Visit (HOSPITAL_COMMUNITY)
Admission: RE | Admit: 2022-10-12 | Discharge: 2022-10-12 | Disposition: A | Discharge status: Home / Self Care | Payer: Medicare HMO | Source: Ambulatory Visit | Attending: Family Medicine | Admitting: Family Medicine

## 2022-10-12 DIAGNOSIS — R1011 Right upper quadrant pain: Secondary | ICD-10-CM | POA: Insufficient documentation

## 2022-10-12 DIAGNOSIS — K8020 Calculus of gallbladder without cholecystitis without obstruction: Secondary | ICD-10-CM | POA: Diagnosis not present

## 2022-10-14 ENCOUNTER — Ambulatory Visit (HOSPITAL_COMMUNITY): Payer: Medicare HMO

## 2022-10-14 NOTE — Progress Notes (Signed)
Cardiology Office Note  Date: 10/19/2022   ID: Maria, Ritter 07/08/1946, MRN [id number]  PCP:  Celene Squibb, MD  Cardiologist:  Rozann Lesches, MD Electrophysiologist:  None   Chief Complaint  Patient presents with   Cardiac follow-up    History of Present Illness: Maria Ritter is a 77 y.o. female last seen in January 2023.  She is here for a routine visit.  Reports no exertional chest pain, stable NYHA class II dyspnea.  She tries to stay as active as she can, has been limited by arthritic symptoms over time.  She has had a recent diagnosis of cholelithiasis, although not clearly cholecystitis.  She has follow-up consultation pending.  I reviewed her recent abdominal ultrasound results.  We also discussed her lab work from November per Dr. [place] Crane.  Her LDL was very well-controlled at 45 at that point.  She continues on Crestor and low-dose aspirin.  Carotid Dopplers done in July of last year also showed mild nonobstructive atherosclerosis.  There is mention of irregular heartbeat during that study, she has known PACs which have already been documented by ECG, no diagnosis of atrial fibrillation.  I personally reviewed her ECG today which shows sinus rhythm with low voltage and intermittent PACs.  Past Medical History:  Diagnosis Date   Breast cancer ([place]) 2010   Right Lumpectomy and XRT   Carotid artery disease (HCC)    Mild to moderate bilateral ICA disease, left greater than right 07/2015   Essential hypertension    GERD (gastroesophageal reflux disease)    Glaucoma    Hyperlipidemia    Hypothyroidism    Osteoarthritis    Polymyalgia rheumatica (HCC)     Current Outpatient Medications  Medication Sig Dispense Refill   acetaminophen (TYLENOL) 650 MG CR tablet Take 1,300 mg by mouth every 8 (eight) hours as needed for pain.     amLODipine (NORVASC) 5 MG tablet Take 5 mg by mouth daily.     Ascorbic Acid (VITAMIN C) 1000 MG tablet Take 1,000 mg by mouth daily.      aspirin EC 81 MG tablet Take 81 mg by mouth daily. Swallow whole.     CALCIUM CITRATE-VITAMIN D PO Take 1 tablet by mouth daily.      Cholecalciferol (VITAMIN D3) 50 MCG (2000 UT) capsule Take 2,000 Units by mouth 2 (two) times daily.     ciprofloxacin-dexamethasone (CIPRODEX) OTIC suspension SMARTSIG:In Ear(s)     clobetasol ointment (TEMOVATE) [id number] % Apply 1 application topically 2 (two) times daily. Do not use longer than two weeks at a time. (Patient taking differently: Apply 1 application  topically 2 (two) times daily as needed (itching). Do not use longer than two weeks at a time.) 30 g 2   clotrimazole (LOTRIMIN) 1 % cream Apply 1 Application topically 2 (two) times daily.     fluticasone (FLONASE) 50 MCG/ACT nasal spray Place 2 sprays into both nostrils at bedtime as needed for allergies.     folic acid (FOLVITE) 1 MG tablet Take 1 mg by mouth daily.     GLUCOSAMINE-CHONDROITIN PO Take 1 tablet by mouth 2 (two) times daily.      hydrocortisone (ANUSOL-HC) 2.5 % rectal cream Place 1 application rectally 2 (two) times daily. Twice a day for 7 days then as needed thereafter 30 g 2   hydrOXYzine (ATARAX) 25 MG tablet Take 1 tablet (25 mg total) by mouth at bedtime as needed. 30 tablet 5   ketoconazole (NIZORAL) 2 %  cream Apply 1 application topically daily as needed for irritation.     levothyroxine (SYNTHROID) 150 MCG tablet Take 150 mcg by mouth daily.     lisinopril (ZESTRIL) 40 MG tablet Take 40 mg by mouth daily.     Magnesium 250 MG TABS Take 250 mg by mouth daily.      meloxicam (MOBIC) 7.5 MG tablet Take 7.5 mg by mouth daily as needed for pain.     methocarbamol (ROBAXIN) 500 MG tablet Take 500 mg by mouth at bedtime as needed for muscle spasms.     methotrexate (RHEUMATREX) 2.5 MG tablet Take 10 mg by mouth every Monday. Caution:Chemotherapy. Protect from light.     montelukast (SINGULAIR) 10 MG tablet Take 10 mg by mouth daily.     Omega-3 1000 MG CAPS Take 1,000 mg by mouth 2  (two) times daily.     polyethylene glycol (MIRALAX / GLYCOLAX) 17 g packet Take 8.5 g by mouth every other day.     rosuvastatin (CRESTOR) 10 MG tablet Take 10 mg by mouth daily.     silver sulfADIAZINE (SILVADENE) 1 % cream Apply 1 application topically daily. 25 g 1   triamcinolone ointment (KENALOG) 0.1 % Apply 1 Application topically 2 (two) times daily. 454 g 1   Turmeric 500 MG CAPS Take 500 mg by mouth daily.     vitamin B-12 (CYANOCOBALAMIN) 500 MCG tablet Take 500 mcg by mouth daily.     zinc gluconate 50 MG tablet Take 50 mg by mouth daily.     No current facility-administered medications for this visit.   Allergies:  Mango flavor, Diclofenac sodium, Erythromycin, Ivp dye [iodinated contrast media], Penicillamine, Penicillins, Red dye, Sulfonamide derivatives, Neosporin [bacitracin-polymyxin b], and Tape   ROS: No palpitations or syncope.  Physical Exam: VS:  BP 132/74   Pulse 67   Ht 5' 2"$  (1.575 m)   Wt 187 lb 6.4 oz (85 kg)   SpO2 95%   BMI 34.28 kg/m , BMI Body mass index is 34.28 kg/m.  Wt Readings from Last 3 Encounters:  10/19/22 187 lb 6.4 oz (85 kg)  04/21/22 182 lb (82.6 kg)  02/02/22 175 lb 11.2 oz (79.7 kg)    General: Patient appears comfortable at rest. HEENT: Conjunctiva and lids normal. Neck: Supple, no elevated JVP or carotid bruits. Lungs: Clear to auscultation, nonlabored breathing at rest. Cardiac: RRR without gallop. Extremities: Mild ankle edema and venous varicosities.  ECG:  An ECG dated 09/09/2021 was personally reviewed today and demonstrated:  Sinus rhythm with atrial bigeminy.  Recent Labwork:  November 2023: TSH 0.366, hemoglobin 13.3, platelets 269, BUN 15, creatinine 0.76, potassium 4.6, AST 35, ALT 24, cholesterol 117, triglycerides 146, HDL 47, LDL 45, hemoglobin A1c 6.1%  Other Studies Reviewed Today:  Carotid Dopplers 03/01/2022: IMPRESSION: Color duplex indicates minimal heterogeneous and calcified plaque, with no  hemodynamically significant stenosis by duplex criteria in the extracranial cerebrovascular circulation.   Irregular heartbeat identified. Correlation with any history of cardiac arrhythmia recommended as well as possibly EKG  Assessment and Plan:  1.  Mild carotid atherosclerosis, asymptomatic and without significant progression by carotid Dopplers in July 2023.  She remains on low-dose aspirin and also Crestor.  Most recent LDL was 45.  2.  Premature atrial contractions, asymptomatic and documented by ECG.  No history of atrial fibrillation.  3.  Essential hypertension, currently on Norvasc and Zestril.  She does have some mild associated lower leg edema, but not to the point that  medications need to be adjusted.  Medication Adjustments/Labs and Tests Ordered: Current medicines are reviewed at length with the patient today.  Concerns regarding medicines are outlined above.   Tests Ordered: Orders Placed This Encounter  Procedures   EKG 12-Lead    Medication Changes: No orders of the defined types were placed in this encounter.   Disposition:  Follow up  1 year.  Signed, Satira Sark, MD, [facility] 10/19/2022 10:40 AM    [place] [facility] [address], [place], [place] [address] Phone: [phone]; Fax: [phone]

## 2022-10-19 ENCOUNTER — Ambulatory Visit: Payer: Medicare HMO | Attending: Cardiology | Admitting: Cardiology

## 2022-10-19 ENCOUNTER — Encounter: Payer: Self-pay | Admitting: Cardiology

## 2022-10-19 VITALS — BP 132/74 | HR 67 | Ht 62.0 in | Wt 187.4 lb

## 2022-10-19 DIAGNOSIS — E782 Mixed hyperlipidemia: Secondary | ICD-10-CM

## 2022-10-19 DIAGNOSIS — I6523 Occlusion and stenosis of bilateral carotid arteries: Secondary | ICD-10-CM | POA: Diagnosis not present

## 2022-10-19 DIAGNOSIS — I10 Essential (primary) hypertension: Secondary | ICD-10-CM

## 2022-10-19 NOTE — Patient Instructions (Addendum)
Medication Instructions:  Your physician recommends that you continue on your current medications as directed. Please refer to the Current Medication list given to you today.   Labwork: None today  Testing/Procedures: None today  Follow-Up: 1 year  Any Other Special Instructions Will Be Listed Below (If Applicable).  If you need a refill on your cardiac medications before your next appointment, please call your pharmacy. Mediterranean Diet A Mediterranean diet refers to food and lifestyle choices that are based on the traditions of countries located on the [employer]. It focuses on eating more fruits, vegetables, whole grains, beans, nuts, seeds, and heart-healthy fats, and eating less dairy, meat, eggs, and processed foods with added sugar, salt, and fat. This way of eating has been shown to help prevent certain conditions and improve outcomes for people who have chronic diseases, like kidney disease and heart disease. What are tips for following this plan? Reading food labels Check the serving size of packaged foods. For foods such as rice and pasta, the serving size refers to the amount of cooked product, not dry. Check the total fat in packaged foods. Avoid foods that have saturated fat or trans fats. Check the ingredient list for added sugars, such as corn syrup. Shopping  Buy a variety of foods that offer a balanced diet, including: Fresh fruits and vegetables (produce). Grains, beans, nuts, and seeds. Some of these may be available in unpackaged forms or large amounts (in bulk). Fresh seafood. Poultry and eggs. Low-fat dairy products. Buy whole ingredients instead of prepackaged foods. Buy fresh fruits and vegetables in-season from local farmers markets. Buy plain frozen fruits and vegetables. If you do not have access to quality fresh seafood, buy precooked frozen shrimp or canned fish, such as tuna, salmon, or sardines. Stock your pantry so you always have certain  foods on hand, such as olive oil, canned tuna, canned tomatoes, rice, pasta, and beans. Cooking Cook foods with extra-virgin olive oil instead of using butter or other vegetable oils. Have meat as a side dish, and have vegetables or grains as your main dish. This means having meat in small portions or adding small amounts of meat to foods like pasta or stew. Use beans or vegetables instead of meat in common dishes like chili or lasagna. Experiment with different cooking methods. Try roasting, broiling, steaming, and sauting vegetables. Add frozen vegetables to soups, stews, pasta, or rice. Add nuts or seeds for added healthy fats and plant protein at each meal. You can add these to yogurt, salads, or vegetable dishes. Marinate fish or vegetables using olive oil, lemon juice, garlic, and fresh herbs. Meal planning Plan to eat one vegetarian meal one day each week. Try to work up to two vegetarian meals, if possible. Eat seafood two or more times a week. Have healthy snacks readily available, such as: Vegetable sticks with hummus. Greek yogurt. Fruit and nut trail mix. Eat balanced meals throughout the week. This includes: Fruit: 2-3 servings a day. Vegetables: 4-5 servings a day. Low-fat dairy: 2 servings a day. Fish, poultry, or lean meat: 1 serving a day. Beans and legumes: 2 or more servings a week. Nuts and seeds: 1-2 servings a day. Whole grains: 6-8 servings a day. Extra-virgin olive oil: 3-4 servings a day. Limit red meat and sweets to only a few servings a month. Lifestyle  Cook and eat meals together with your family, when possible. Drink enough fluid to keep your urine pale yellow. Be physically active every day. This includes: Aerobic exercise  like running or swimming. Leisure activities like gardening, walking, or housework. Get 7-8 hours of sleep each night. If recommended by your health care provider, drink red wine in moderation. This means 1 glass a day for  nonpregnant women and 2 glasses a day for men. A glass of wine equals 5 oz (150 mL). What foods should I eat? Fruits Apples. Apricots. Avocado. Berries. Bananas. Cherries. Dates. Figs. Grapes. Lemons. Melon. Oranges. Peaches. Plums. Pomegranate. Vegetables Artichokes. Beets. Broccoli. Cabbage. Carrots. Eggplant. Green beans. Chard. Kale. Spinach. Onions. Leeks. Peas. Squash. Tomatoes. Peppers. Radishes. Grains Whole-grain pasta. Brown rice. Bulgur wheat. Polenta. Couscous. Whole-wheat bread. [place]. Meats and other proteins Beans. Almonds. Sunflower seeds. Pine nuts. Peanuts. [place]. Salmon. Scallops. Shrimp. [place]. Tilapia. Clams. Oysters. Eggs. Poultry without skin. Dairy Low-fat milk. Cheese. Greek yogurt. Fats and oils Extra-virgin olive oil. Avocado oil. Grapeseed oil. Beverages Water. Red wine. Herbal tea. Sweets and desserts Greek yogurt with honey. Baked apples. Poached pears. Trail mix. Seasonings and condiments Basil. Cilantro. Coriander. Cumin. Mint. Parsley. Sage. Rosemary. Tarragon. Garlic. Oregano. Thyme. Pepper. Balsamic vinegar. Tahini. Hummus. Tomato sauce. Olives. Mushrooms. The items listed above may not be a complete list of foods and beverages you can eat. Contact a dietitian for more information. What foods should I limit? This is a list of foods that should be eaten rarely or only on special occasions. Fruits Fruit canned in syrup. Vegetables Deep-fried potatoes (french fries). Grains Prepackaged pasta or rice dishes. Prepackaged cereal with added sugar. Prepackaged snacks with added sugar. Meats and other proteins Beef. Pork. Lamb. Poultry with skin. Hot dogs. Berniece Salines. Dairy Ice cream. Sour cream. Whole milk. Fats and oils Butter. Canola oil. Vegetable oil. Beef fat (tallow). Lard. Beverages Juice. Sugar-sweetened soft drinks. Beer. Liquor and spirits. Sweets and desserts Cookies. Cakes. Pies. Candy. Seasonings and condiments Mayonnaise. Pre-made  sauces and marinades. The items listed above may not be a complete list of foods and beverages you should limit. Contact a dietitian for more information. Summary The Mediterranean diet includes both food and lifestyle choices. Eat a variety of fresh fruits and vegetables, beans, nuts, seeds, and whole grains. Limit the amount of red meat and sweets that you eat. If recommended by your health care provider, drink red wine in moderation. This means 1 glass a day for nonpregnant women and 2 glasses a day for men. A glass of wine equals 5 oz (150 mL). This information is not intended to replace advice given to you by your health care provider. Make sure you discuss any questions you have with your health care provider. Document Revised: 09/21/2019 Document Reviewed: 07/19/2019 Elsevier Patient Education  [place] Eating Plan DASH stands for Dietary Approaches to Stop Hypertension. The DASH eating plan is a healthy eating plan that has been shown to: Reduce high blood pressure (hypertension). Reduce your risk for type 2 diabetes, heart disease, and stroke. Help with weight loss. What are tips for following this plan? Reading food labels Check food labels for the amount of salt (sodium) per serving. Choose foods with less than 5 percent of the Daily Value of sodium. Generally, foods with less than 300 milligrams (mg) of sodium per serving fit into this eating plan. To find whole grains, look for the word "whole" as the first word in the ingredient list. Shopping Buy products labeled as "low-sodium" or "no salt added." Buy fresh foods. Avoid canned foods and pre-made or frozen meals. Cooking Avoid adding salt when cooking. Use salt-free  seasonings or herbs instead of table salt or sea salt. Check with your health care provider or pharmacist before using salt substitutes. Do not fry foods. Cook foods using healthy methods such as baking, boiling, grilling, roasting, and  broiling instead. Cook with heart-healthy oils, such as olive, canola, avocado, soybean, or sunflower oil. Meal planning  Eat a balanced diet that includes: 4 or more servings of fruits and 4 or more servings of vegetables each day. Try to fill one-half of your plate with fruits and vegetables. 6-8 servings of whole grains each day. Less than 6 oz (170 g) of lean meat, poultry, or fish each day. A 3-oz (85-g) serving of meat is about the same size as a deck of cards. One egg equals 1 oz (28 g). 2-3 servings of low-fat dairy each day. One serving is 1 cup (237 mL). 1 serving of nuts, seeds, or beans 5 times each week. 2-3 servings of heart-healthy fats. Healthy fats called omega-3 fatty acids are found in foods such as walnuts, flaxseeds, fortified milks, and eggs. These fats are also found in cold-water fish, such as sardines, salmon, and mackerel. Limit how much you eat of: Canned or prepackaged foods. Food that is high in trans fat, such as some fried foods. Food that is high in saturated fat, such as fatty meat. Desserts and other sweets, sugary drinks, and other foods with added sugar. Full-fat dairy products. Do not salt foods before eating. Do not eat more than 4 egg yolks a week. Try to eat at least 2 vegetarian meals a week. Eat more home-cooked food and less restaurant, buffet, and fast food. Lifestyle When eating at a restaurant, ask that your food be prepared with less salt or no salt, if possible. If you drink alcohol: Limit how much you use to: 0-1 drink a day for women who are not pregnant. 0-2 drinks a day for men. Be aware of how much alcohol is in your drink. In the [place] one drink equals one 12 oz bottle of beer (355 mL), one 5 oz glass of wine (148 mL), or one 1 oz glass of hard liquor (44 mL). General information Avoid eating more than 2,300 mg of salt a day. If you have hypertension, you may need to reduce your sodium intake to 1,500 mg a day. Work with your health  care provider to maintain a healthy body weight or to lose weight. Ask what an ideal weight is for you. Get at least 30 minutes of exercise that causes your heart to beat faster (aerobic exercise) most days of the week. Activities may include walking, swimming, or biking. Work with your health care provider or dietitian to adjust your eating plan to your individual calorie needs. What foods should I eat? Fruits All fresh, dried, or frozen fruit. Canned fruit in natural juice (without added sugar). Vegetables Fresh or frozen vegetables (raw, steamed, roasted, or grilled). Low-sodium or reduced-sodium tomato and vegetable juice. Low-sodium or reduced-sodium tomato sauce and tomato paste. Low-sodium or reduced-sodium canned vegetables. Grains Whole-grain or whole-wheat bread. Whole-grain or whole-wheat pasta. Brown rice. [place]. Bulgur. Whole-grain and low-sodium cereals. Pita bread. Low-fat, low-sodium crackers. Whole-wheat flour tortillas. Meats and other proteins Skinless chicken or [place]. Ground chicken or [place]. Pork with fat trimmed off. Fish and seafood. Egg whites. Dried beans, peas, or lentils. Unsalted nuts, nut butters, and seeds. Unsalted canned beans. Lean cuts of beef with fat trimmed off. Low-sodium, lean precooked or cured meat, such as sausages or meat loaves.  Dairy Low-fat (1%) or fat-free (skim) milk. Reduced-fat, low-fat, or fat-free cheeses. Nonfat, low-sodium ricotta or cottage cheese. Low-fat or nonfat yogurt. Low-fat, low-sodium cheese. Fats and oils Soft margarine without trans fats. Vegetable oil. Reduced-fat, low-fat, or light mayonnaise and salad dressings (reduced-sodium). Canola, safflower, olive, avocado, soybean, and sunflower oils. Avocado. Seasonings and condiments Herbs. Spices. Seasoning mixes without salt. Other foods Unsalted popcorn and pretzels. Fat-free sweets. The items listed above may not be a complete list of foods and beverages you can eat.  Contact a dietitian for more information. What foods should I avoid? Fruits Canned fruit in a light or heavy syrup. Fried fruit. Fruit in cream or butter sauce. Vegetables Creamed or fried vegetables. Vegetables in a cheese sauce. Regular canned vegetables (not low-sodium or reduced-sodium). Regular canned tomato sauce and paste (not low-sodium or reduced-sodium). Regular tomato and vegetable juice (not low-sodium or reduced-sodium). Angie Fava. Olives. Grains Baked goods made with fat, such as croissants, muffins, or some breads. Dry pasta or rice meal packs. Meats and other proteins Fatty cuts of meat. Ribs. Fried meat. Berniece Salines. Bologna, salami, and other precooked or cured meats, such as sausages or meat loaves. Fat from the back of a pig (fatback). Bratwurst. Salted nuts and seeds. Canned beans with added salt. Canned or smoked fish. Whole eggs or egg yolks. Chicken or [place] with skin. Dairy Whole or 2% milk, cream, and half-and-half. Whole or full-fat cream cheese. Whole-fat or sweetened yogurt. Full-fat cheese. Nondairy creamers. Whipped toppings. Processed cheese and cheese spreads. Fats and oils Butter. Stick margarine. Lard. Shortening. Ghee. Bacon fat. Tropical oils, such as coconut, palm kernel, or palm oil. Seasonings and condiments Onion salt, garlic salt, seasoned salt, table salt, and sea salt. Worcestershire sauce. Tartar sauce. Barbecue sauce. Teriyaki sauce. Soy sauce, including reduced-sodium. Steak sauce. Canned and packaged gravies. Fish sauce. Oyster sauce. Cocktail sauce. Store-bought horseradish. Ketchup. Mustard. Meat flavorings and tenderizers. Bouillon cubes. Hot sauces. Pre-made or packaged marinades. Pre-made or packaged taco seasonings. Relishes. Regular salad dressings. Other foods Salted popcorn and pretzels. The items listed above may not be a complete list of foods and beverages you should avoid. Contact a dietitian for more information. Where to find more  information National Heart, Lung, and Blood Institute: https://wilson-eaton.com/ American Heart Association: www.heart.org Academy of Nutrition and Dietetics: www.eatright.[place]: www.kidney.org Summary The DASH eating plan is a healthy eating plan that has been shown to reduce high blood pressure (hypertension). It may also reduce your risk for type 2 diabetes, heart disease, and stroke. When on the DASH eating plan, aim to eat more fresh fruits and vegetables, whole grains, lean proteins, low-fat dairy, and heart-healthy fats. With the DASH eating plan, you should limit salt (sodium) intake to 2,300 mg a day. If you have hypertension, you may need to reduce your sodium intake to 1,500 mg a day. Work with your health care provider or dietitian to adjust your eating plan to your individual calorie needs. This information is not intended to replace advice given to you by your health care provider. Make sure you discuss any questions you have with your health care provider. Document Revised: 07/20/2019 Document Reviewed: 07/20/2019 Elsevier Patient Education  [place].

## 2022-10-21 DIAGNOSIS — E782 Mixed hyperlipidemia: Secondary | ICD-10-CM | POA: Diagnosis not present

## 2022-10-21 DIAGNOSIS — E039 Hypothyroidism, unspecified: Secondary | ICD-10-CM | POA: Diagnosis not present

## 2022-10-21 DIAGNOSIS — R7301 Impaired fasting glucose: Secondary | ICD-10-CM | POA: Diagnosis not present

## 2022-10-22 ENCOUNTER — Ambulatory Visit: Payer: Medicare HMO | Admitting: Allergy & Immunology

## 2022-10-27 DIAGNOSIS — M179 Osteoarthritis of knee, unspecified: Secondary | ICD-10-CM | POA: Diagnosis not present

## 2022-10-27 DIAGNOSIS — M5136 Other intervertebral disc degeneration, lumbar region: Secondary | ICD-10-CM | POA: Diagnosis not present

## 2022-10-27 DIAGNOSIS — L608 Other nail disorders: Secondary | ICD-10-CM | POA: Diagnosis not present

## 2022-10-27 DIAGNOSIS — E039 Hypothyroidism, unspecified: Secondary | ICD-10-CM | POA: Diagnosis not present

## 2022-10-27 DIAGNOSIS — E782 Mixed hyperlipidemia: Secondary | ICD-10-CM | POA: Diagnosis not present

## 2022-10-27 DIAGNOSIS — Z853 Personal history of malignant neoplasm of breast: Secondary | ICD-10-CM | POA: Diagnosis not present

## 2022-10-27 DIAGNOSIS — M25551 Pain in right hip: Secondary | ICD-10-CM | POA: Diagnosis not present

## 2022-10-27 DIAGNOSIS — I119 Hypertensive heart disease without heart failure: Secondary | ICD-10-CM | POA: Diagnosis not present

## 2022-10-27 DIAGNOSIS — K7689 Other specified diseases of liver: Secondary | ICD-10-CM | POA: Diagnosis not present

## 2022-10-27 DIAGNOSIS — R7303 Prediabetes: Secondary | ICD-10-CM | POA: Diagnosis not present

## 2022-10-27 DIAGNOSIS — I2510 Atherosclerotic heart disease of native coronary artery without angina pectoris: Secondary | ICD-10-CM | POA: Diagnosis not present

## 2022-10-27 DIAGNOSIS — I10 Essential (primary) hypertension: Secondary | ICD-10-CM | POA: Diagnosis not present

## 2022-10-29 DIAGNOSIS — I739 Peripheral vascular disease, unspecified: Secondary | ICD-10-CM | POA: Diagnosis not present

## 2022-11-05 DIAGNOSIS — J069 Acute upper respiratory infection, unspecified: Secondary | ICD-10-CM | POA: Diagnosis not present

## 2022-11-05 DIAGNOSIS — R059 Cough, unspecified: Secondary | ICD-10-CM | POA: Diagnosis not present

## 2022-11-10 ENCOUNTER — Other Ambulatory Visit: Payer: Self-pay

## 2022-11-10 ENCOUNTER — Encounter: Payer: Self-pay | Admitting: Family Medicine

## 2022-11-10 ENCOUNTER — Ambulatory Visit: Payer: Medicare HMO | Admitting: Family Medicine

## 2022-11-10 VITALS — BP 132/74 | HR 78 | Temp 98.0°F | Resp 20

## 2022-11-10 DIAGNOSIS — L299 Pruritus, unspecified: Secondary | ICD-10-CM | POA: Diagnosis not present

## 2022-11-10 DIAGNOSIS — L2089 Other atopic dermatitis: Secondary | ICD-10-CM | POA: Diagnosis not present

## 2022-11-10 DIAGNOSIS — J3089 Other allergic rhinitis: Secondary | ICD-10-CM

## 2022-11-10 DIAGNOSIS — J302 Other seasonal allergic rhinitis: Secondary | ICD-10-CM

## 2022-11-10 MED ORDER — PREDNISONE 10 MG PO TABS: ORAL_TABLET | ORAL | 0 refills | Status: DC

## 2022-11-10 NOTE — Progress Notes (Signed)
[place], [place] [address] Dept: [phone]  FOLLOW UP NOTE  Patient ID: Maria Ritter, female    DOB: July 07, 1946  Age: 77 y.o. MRN: [id number] Date of Office Visit: 11/10/2022  Assessment  Chief Complaint: Cough (Went to her pcp last week for a cough. Has been having runny eyes, runny nose, coughing, coughing up yellow phlegm. Wakes up during the night with the cough and drainage. )  HPI Maria Ritter is a 77 year old female who presents to the clinic for follow-up visit.  She was last seen in this clinic on 02/04/2022 by Dr. Ernst Bowler for evaluation of itch, allergic rhinitis, atopic dermatitis, elevated eosinophil, and candidiasis.  At today's visit, she reports that, for the last week, she has been experiencing symptoms including cough which is occurring in the day and night, thick clear rhinorrhea, nasal congestion, and thick post nasal drainage. She recently saw her primary care provider who started her on montelukast 10 mg once a day and levocetirizine. She is not currently using any nasal saline spray or any steroid nasal spray. Her last environmental allergy testing was on 11/01/2017 and was positive to grass pollen, mold, and dust mites. Her last environmental allgery testing via lab was on 12/22/2017 and was positive to mold and dust mites.   Pruritus is reported as well controlled with a daily moisturizing routine as well as occasional use of triamcinolone and clobetasol as needed.   Her current medications are listed in the chart.   Drug Allergies:  Allergies  Allergen Reactions   Mango Flavor Hives   Diclofenac Sodium Other (See Comments)    Too strong, bloating, didn't agree with stomach   Erythromycin Nausea Only   Ivp Dye [Iodinated Contrast Media]    Penicillamine Other (See Comments)   Penicillins Itching    Did it involve swelling of the face/tongue/throat, SOB, or low BP? No Did it involve sudden or severe rash/hives, skin peeling, or  any reaction on the inside of your mouth or nose? No Did you need to seek medical attention at a hospital or doctor's office? No When did it last happen?   10 years ago   If all above answers are "NO", may proceed with cephalosporin use.    Red Dye Other (See Comments)   Sulfonamide Derivatives Itching   Neosporin [Bacitracin-Polymyxin B] Itching   Tape Hives and Rash    Tape or gel    Physical Exam: BP 132/74   Pulse 78   Temp 98 F (36.7 C)   Resp 20   SpO2 92%    Physical Exam Vitals reviewed.  Constitutional:      Appearance: Normal appearance.  HENT:     Head: Normocephalic and atraumatic.     Right Ear: Tympanic membrane normal.     Left Ear: Tympanic membrane normal.     Nose:     Comments: Bilateral nares slightly erythematous with thin clear nasal drainage noted. Pharynx normal. Ears normal. Eyes normal.    Mouth/Throat:     Pharynx: Oropharynx is clear.  Eyes:     Conjunctiva/sclera: Conjunctivae normal.  Cardiovascular:     Rate and Rhythm: Normal rate and regular rhythm.     Heart sounds: Normal heart sounds. No murmur heard. Pulmonary:     Effort: Pulmonary effort is normal.     Breath sounds: Normal breath sounds.     Comments: Lungs clear to auscultation Musculoskeletal:        General: Normal range of  motion.     Cervical back: Normal range of motion and neck supple.  Skin:    General: Skin is warm and dry.  Neurological:     Mental Status: She is alert and oriented to person, place, and time.  Psychiatric:        Mood and Affect: Mood normal.        Behavior: Behavior normal.        Thought Content: Thought content normal.        Judgment: Judgment normal.     Assessment and Plan: 1. Itching   2. Seasonal and perennial allergic rhinitis   3. Flexural atopic dermatitis     Meds ordered this encounter  Medications   predniSONE (DELTASONE) 10 MG tablet    Sig: Take 20 mg twice a day for 5 days, then stop    Dispense:  20 tablet     Refill:  0    Patient Instructions  Allergic rhinitis Begin prednisone 20 mg twice a day for the next 5 days, then stop Continue allergen avoidance measures directed toward grass pollen, mold, and dust mites as listed below Continue montelukast 10 mg once a day to control allergy symptoms and cough Continue Xyzal 5 mg once a day as needed for a runny nose or itch Begin Flonase 2 sprays in each nostril once a day as needed for a stuffy nose Consider saline nasal rinses as needed for nasal symptoms. Use this before any medicated nasal sprays for best result For thick post nasal drainage begin Mucinex [phone] mg twice a day  Atopic dermatitis Continue with moisturizing twice daily as as you are doing. Continue with triamcinolone 0.1% ointment (LARGE TUB) for the lesions on your thighs and elbows.  CONTINUE WITH clobetasol 0.05% ointment twice daily for TWO WEEKS at a time (can use with thick skinned areas, avoiding the face, fingers, axilla, and groin).  Call the clinic if this treatment plan is not working well for you  Follow up in 2 months or sooner if needed.     No follow-ups on file.    Thank you for the opportunity to care for this patient.  Please do not hesitate to contact me with questions.  Gareth Morgan, FNP [facility] [place] [facility] [place]

## 2022-11-10 NOTE — Patient Instructions (Addendum)
Allergic rhinitis Begin prednisone 20 mg twice a day for the next 5 days, then stop Continue allergen avoidance measures directed toward grass pollen, mold, and dust mites as listed below Continue montelukast 10 mg once a day to control allergy symptoms and cough Continue Xyzal 5 mg once a day as needed for a runny nose or itch Begin Flonase 2 sprays in each nostril once a day as needed for a stuffy nose Consider saline nasal rinses as needed for nasal symptoms. Use this before any medicated nasal sprays for best result For thick post nasal drainage begin Mucinex [phone] mg twice a day  Atopic dermatitis Continue with moisturizing twice daily as as you are doing. Continue with triamcinolone 0.1% ointment (LARGE TUB) for the lesions on your thighs and elbows.  CONTINUE WITH clobetasol 0.05% ointment twice daily for TWO WEEKS at a time (can use with thick skinned areas, avoiding the face, fingers, axilla, and groin).  Call the clinic if this treatment plan is not working well for you  Follow up in 2 months or sooner if needed.

## 2022-11-11 ENCOUNTER — Encounter (INDEPENDENT_AMBULATORY_CARE_PROVIDER_SITE_OTHER): Payer: Self-pay | Admitting: Gastroenterology

## 2022-11-11 ENCOUNTER — Ambulatory Visit (INDEPENDENT_AMBULATORY_CARE_PROVIDER_SITE_OTHER): Payer: Medicare HMO | Admitting: Gastroenterology

## 2022-11-11 VITALS — BP 130/76 | HR 72 | Temp 97.3°F | Ht 64.0 in | Wt 183.8 lb

## 2022-11-11 DIAGNOSIS — R1011 Right upper quadrant pain: Secondary | ICD-10-CM | POA: Diagnosis not present

## 2022-11-11 DIAGNOSIS — K8020 Calculus of gallbladder without cholecystitis without obstruction: Secondary | ICD-10-CM | POA: Diagnosis not present

## 2022-11-11 NOTE — Patient Instructions (Signed)
Call back to our office if presenting recurrent pain in the abdomen, will need to refer you to the general surgeon If presenting severe abdominal pain in the abdomen with nausea and vomiting, please go to the ER for further evaluation

## 2022-11-11 NOTE — Progress Notes (Signed)
[name], M.D. Gastroenterology & Hepatology [facility] [address] [place], [place] [address]  Primary Care Physician: Celene Squibb, MD [address] [id number]  I will communicate my assessment and recommendations to the referring MD via EMR.  Problems: Right upper quadrant abdominal pain, resolved Possible symptomatic gallstones NASH  History of Present Illness: Maria Ritter is a 77 y.o. female with past medical history of right breast cancer, carotid artery disease, GERD, hypertension, hyperlipidemia, hypothyroidism, PMR, osteoarthritis and diverticular bleeding, who presents for evaluation of abdominal pain.  The patient was last seen on 02/02/2022. At that time, the patient was advised to start taking MiraLAX every other day for management of constipation.  She was presenting symptoms likely related to diverticular bleeding that was self-limited.  Patient reports that in late January 2024 she presented recurrent episodes of pain in her RUQ, which she described as a stabbing pain. She reports the pain was present when she was standing but resolved when she sat down. Did not identify any specific type of foods that triggered the pain. After she underwent an [place], she has avoided eating fried foods or fatty foods. States that shortly after she changed her diet she felt the pain went away.  The patient denies having any dysphagia, heartburn, nausea, vomiting, fever, chills, hematochezia, melena, hematemesis, abdominal distention, abdominal pain, diarrhea, jaundice, pruritus or weight loss.  Patient underwent a right upper quadrant ultrasound on 10/12/2022 which showed presence of cholelithiasis without cholecystitis, stone measured 2 cm.  Also had some fatty liver changes.Received results of most recent labs from 07/06/2022.  CMP with creatinine 0.76, BUN 15, sodium 140, potassium 4.6, albumin 4.1, total bilirubin 0.5,  alkaline phosphatase 73, AST 35, ALT 24, CBC with WBC 7.2, hemoglobin 13.3, platelets 269.  Last EGD: never Last Colonoscopy: 09/18/2021, 5 mm polyp in the transverse colon (tubular adenoma), diverticulosis in sigmoid colon, nonbleeding internal hemorrhoids. Recommended repeat colonoscopy in 7 years  Past Medical History: Past Medical History:  Diagnosis Date   Breast cancer ([place]) 2010   Right Lumpectomy and XRT   Carotid artery disease ([facility])    Mild to moderate bilateral ICA disease, left greater than right 07/2015   Essential hypertension    GERD (gastroesophageal reflux disease)    Glaucoma    Hyperlipidemia    Hypothyroidism    Osteoarthritis    Polymyalgia rheumatica ([place])     Past Surgical History: Past Surgical History:  Procedure Laterality Date   AXILLARY LYMPH NODE BIOPSY Right 05/21/2019   Procedure: RIGHT AXILLARY LYMPH NODE BIOPSY;  Surgeon: Aviva Signs, MD;  Location: [facility];  Service: General;  Laterality: Right;   BASAL CELL CARCINOMA EXCISION  2010   Right breast   BREAST LUMPECTOMY Right 2010   COLONOSCOPY WITH PROPOFOL N/A 09/18/2021   Procedure: COLONOSCOPY WITH PROPOFOL;  Surgeon: Harvel Quale, MD;  Location: [facility];  Service: Gastroenterology;  Laterality: N/A;  850   ENDOMETRIAL ABLATION  2002   FINGER SURGERY     Minor surgical intervention following a crush injury to the left fourth finger   POLYPECTOMY  09/18/2021   Procedure: POLYPECTOMY;  Surgeon: Harvel Quale, MD;  Location: [facility];  Service: Gastroenterology;;   TOE SURGERY Right 07/30/2020   2nd toe on right foot   TOOTH EXTRACTION  01/2019    Family History: Family History  Problem Relation Age of Onset   Pneumonia Mother    Parkinsonism  Mother    Heart failure Father     Social History: Social History   Tobacco Use  Smoking Status Never   Passive exposure: Past  Smokeless Tobacco Never   Social History   Substance and Sexual Activity   Alcohol Use No   Alcohol/week: 0.0 standard drinks of alcohol   Social History   Substance and Sexual Activity  Drug Use No    Allergies: Allergies  Allergen Reactions   Mango Flavor Hives   Diclofenac Sodium Other (See Comments)    Too strong, bloating, didn't agree with stomach   Erythromycin Nausea Only   Ivp Dye [Iodinated Contrast Media]    Penicillamine Other (See Comments)   Penicillins Itching    Did it involve swelling of the face/tongue/throat, SOB, or low BP? No Did it involve sudden or severe rash/hives, skin peeling, or any reaction on the inside of your mouth or nose? No Did you need to seek medical attention at a hospital or doctor's office? No When did it last happen?   10 years ago   If all above answers are "NO", may proceed with cephalosporin use.    Red Dye Other (See Comments)   Sulfonamide Derivatives Itching   Neosporin [Bacitracin-Polymyxin B] Itching   Tape Hives and Rash    Tape or gel    Medications: Current Outpatient Medications  Medication Sig Dispense Refill   acetaminophen (TYLENOL) 650 MG CR tablet Take 1,300 mg by mouth every 8 (eight) hours as needed for pain.     amLODipine (NORVASC) 5 MG tablet Take 5 mg by mouth daily.     Ascorbic Acid (VITAMIN C) 1000 MG tablet Take 1,000 mg by mouth daily.     aspirin EC 81 MG tablet Take 81 mg by mouth daily. Swallow whole.     bacitracin-polymyxin b (POLYSPORIN) ointment Apply topically 2 (two) times daily.     CALCIUM CITRATE-VITAMIN D PO Take 1 tablet by mouth daily.      Cholecalciferol (VITAMIN D3) 50 MCG (2000 UT) capsule Take 2,000 Units by mouth 2 (two) times daily.     fexofenadine (ALLEGRA ALLERGY) 180 MG tablet 1 tablet Swallow whole with water; do not take with fruit juices. Orally Once a day     fluticasone (FLONASE) 50 MCG/ACT nasal spray Place 2 sprays into both nostrils at bedtime as needed for allergies.     folic acid (FOLVITE) 1 MG tablet Take 1 mg by mouth daily.      GLUCOSAMINE-CHONDROITIN PO Take 1 tablet by mouth 2 (two) times daily.      hydrOXYzine (ATARAX) 25 MG tablet Take 1 tablet (25 mg total) by mouth at bedtime as needed. 30 tablet 5   ketoconazole (NIZORAL) 2 % cream Apply 1 application topically daily as needed for irritation.     levocetirizine (XYZAL) 5 MG tablet Take 1 tablet every day by oral route.     levothyroxine (SYNTHROID) 150 MCG tablet Take 150 mcg by mouth daily.     lisinopril (ZESTRIL) 40 MG tablet Take 40 mg by mouth daily.     Magnesium 250 MG TABS Take 250 mg by mouth daily.      meloxicam (MOBIC) 7.5 MG tablet Take 7.5 mg by mouth daily as needed for pain.     methocarbamol (ROBAXIN) 500 MG tablet Take 500 mg by mouth at bedtime as needed for muscle spasms.     methotrexate (RHEUMATREX) 2.5 MG tablet Take 10 mg by mouth every Monday. Caution:Chemotherapy. Protect from  light.     montelukast (SINGULAIR) 10 MG tablet Take 10 mg by mouth daily.     Omega-3 1000 MG CAPS Take 1,000 mg by mouth 2 (two) times daily.     polyethylene glycol (MIRALAX / GLYCOLAX) 17 g packet Take 8.5 g by mouth every other day.     predniSONE (DELTASONE) 10 MG tablet Take 20 mg twice a day for 5 days, then stop 20 tablet 0   psyllium (HYDROCIL/METAMUCIL) 95 % PACK Take 1 packet by mouth daily.     raloxifene (EVISTA) 60 MG tablet Take 1 tablet by mouth daily.     rosuvastatin (CRESTOR) 10 MG tablet Take 10 mg by mouth daily.     silver sulfADIAZINE (SILVADENE) 1 % cream Apply 1 application topically daily. 25 g 1   triamcinolone ointment (KENALOG) 0.1 % Apply 1 Application topically 2 (two) times daily. 454 g 1   Turmeric 500 MG CAPS Take 500 mg by mouth daily.     vitamin B-12 (CYANOCOBALAMIN) 500 MCG tablet Take 500 mcg by mouth daily.     zinc gluconate 50 MG tablet Take 50 mg by mouth daily.     No current facility-administered medications for this visit.    Review of Systems: GENERAL: negative for malaise, night sweats HEENT: No changes in  hearing or vision, no nose bleeds or other nasal problems. NECK: Negative for lumps, goiter, pain and significant neck swelling RESPIRATORY: Negative for cough, wheezing CARDIOVASCULAR: Negative for chest pain, leg swelling, palpitations, orthopnea GI: SEE HPI MUSCULOSKELETAL: Negative for joint pain or swelling, back pain, and muscle pain. SKIN: Negative for lesions, rash PSYCH: Negative for sleep disturbance, mood disorder and recent psychosocial stressors. HEMATOLOGY Negative for prolonged bleeding, bruising easily, and swollen nodes. ENDOCRINE: Negative for cold or heat intolerance, polyuria, polydipsia and goiter. NEURO: negative for tremor, gait imbalance, syncope and seizures. The remainder of the review of systems is noncontributory.   Physical Exam: BP 130/76 (BP Location: Right Arm, Patient Position: Sitting, Cuff Size: Large)   Pulse 72   Temp (!) 97.3 F (36.3 C) (Temporal)   Ht '5\' 4"'$  (1.626 m)   Wt 183 lb 12.8 oz (83.4 kg)   BMI 31.55 kg/m  GENERAL: The patient is AO x3, in no acute distress. HEENT: Head is normocephalic and atraumatic. EOMI are intact. Mouth is well hydrated and without lesions. NECK: Supple. No masses LUNGS: Clear to auscultation. No presence of rhonchi/wheezing/rales. Adequate chest expansion HEART: RRR, normal s1 and s2. ABDOMEN: Soft, nontender, no guarding, no peritoneal signs, and nondistended. BS +. No masses. EXTREMITIES: Without any cyanosis, clubbing, rash, lesions or edema. NEUROLOGIC: AOx3, no focal motor deficit. SKIN: no jaundice, no rashes  Imaging/Labs: as above  I personally reviewed and interpreted the available labs, imaging and endoscopic files.  Impression and Plan: Maria Ritter is a 77 y.o. female with past medical history of right breast cancer, carotid artery disease, GERD, hypertension, hyperlipidemia, hypothyroidism, PMR, osteoarthritis and diverticular bleeding, who presents for evaluation of abdominal pain.  The  patient presented transient abdominal pain that improved with dietary changes, but was found to have gallstones on recent imaging..  Clinical symptoms are not typical for biliary colic, although I cannot rule out completely this etiology as she has improved with dietary modifications.  I offered her to be evaluated by a general surgeon to discuss her symptoms but she would like to hold off on it for now.  I gave her instructions on when to attend the  ER for emergent evaluation.  We also discussed that if her symptoms were to recur, she would benefit from being seen by a surgeon, which she agreed with.  - Call back to our office if presenting recurrent pain in the abdomen, will need to refer you to the general surgeon - If presenting severe abdominal pain in the abdomen with nausea and vomiting, please go to the ER for further evaluation  All questions were answered.      [name], MD Gastroenterology and Hepatology [facility]

## 2023-01-11 DIAGNOSIS — I739 Peripheral vascular disease, unspecified: Secondary | ICD-10-CM | POA: Diagnosis not present

## 2023-01-17 DIAGNOSIS — D485 Neoplasm of uncertain behavior of skin: Secondary | ICD-10-CM | POA: Diagnosis not present

## 2023-01-17 DIAGNOSIS — L719 Rosacea, unspecified: Secondary | ICD-10-CM | POA: Diagnosis not present

## 2023-01-17 DIAGNOSIS — L570 Actinic keratosis: Secondary | ICD-10-CM | POA: Diagnosis not present

## 2023-01-17 DIAGNOSIS — Z85828 Personal history of other malignant neoplasm of skin: Secondary | ICD-10-CM | POA: Diagnosis not present

## 2023-01-17 DIAGNOSIS — L304 Erythema intertrigo: Secondary | ICD-10-CM | POA: Diagnosis not present

## 2023-01-19 ENCOUNTER — Ambulatory Visit: Payer: Medicare HMO | Admitting: Family Medicine

## 2023-01-19 DIAGNOSIS — L4059 Other psoriatic arthropathy: Secondary | ICD-10-CM | POA: Diagnosis not present

## 2023-03-19 DIAGNOSIS — I872 Venous insufficiency (chronic) (peripheral): Secondary | ICD-10-CM | POA: Diagnosis not present

## 2023-03-19 DIAGNOSIS — I10 Essential (primary) hypertension: Secondary | ICD-10-CM | POA: Diagnosis not present

## 2023-03-19 DIAGNOSIS — R600 Localized edema: Secondary | ICD-10-CM | POA: Diagnosis not present

## 2023-03-21 DIAGNOSIS — R600 Localized edema: Secondary | ICD-10-CM | POA: Diagnosis not present

## 2023-03-22 DIAGNOSIS — I739 Peripheral vascular disease, unspecified: Secondary | ICD-10-CM | POA: Diagnosis not present

## 2023-04-20 DIAGNOSIS — M5441 Lumbago with sciatica, right side: Secondary | ICD-10-CM | POA: Diagnosis not present

## 2023-04-20 DIAGNOSIS — M810 Age-related osteoporosis without current pathological fracture: Secondary | ICD-10-CM | POA: Diagnosis not present

## 2023-04-20 DIAGNOSIS — Z6831 Body mass index (BMI) 31.0-31.9, adult: Secondary | ICD-10-CM | POA: Diagnosis not present

## 2023-04-20 DIAGNOSIS — M25551 Pain in right hip: Secondary | ICD-10-CM | POA: Diagnosis not present

## 2023-04-20 DIAGNOSIS — R220 Localized swelling, mass and lump, head: Secondary | ICD-10-CM | POA: Diagnosis not present

## 2023-04-20 DIAGNOSIS — E669 Obesity, unspecified: Secondary | ICD-10-CM | POA: Diagnosis not present

## 2023-04-20 DIAGNOSIS — L4059 Other psoriatic arthropathy: Secondary | ICD-10-CM | POA: Diagnosis not present

## 2023-04-20 DIAGNOSIS — M25561 Pain in right knee: Secondary | ICD-10-CM | POA: Diagnosis not present

## 2023-04-20 DIAGNOSIS — M5030 Other cervical disc degeneration, unspecified cervical region: Secondary | ICD-10-CM | POA: Diagnosis not present

## 2023-04-20 DIAGNOSIS — R1011 Right upper quadrant pain: Secondary | ICD-10-CM | POA: Diagnosis not present

## 2023-04-21 ENCOUNTER — Encounter (HOSPITAL_COMMUNITY): Payer: Self-pay

## 2023-04-21 ENCOUNTER — Ambulatory Visit (HOSPITAL_COMMUNITY)
Admission: RE | Admit: 2023-04-21 | Discharge: 2023-04-21 | Disposition: A | Discharge status: Home / Self Care | Payer: Medicare HMO | Source: Ambulatory Visit | Attending: Internal Medicine | Admitting: Internal Medicine

## 2023-04-21 DIAGNOSIS — Z1231 Encounter for screening mammogram for malignant neoplasm of breast: Secondary | ICD-10-CM | POA: Diagnosis not present

## 2023-04-22 DIAGNOSIS — E782 Mixed hyperlipidemia: Secondary | ICD-10-CM | POA: Diagnosis not present

## 2023-04-22 DIAGNOSIS — E039 Hypothyroidism, unspecified: Secondary | ICD-10-CM | POA: Diagnosis not present

## 2023-04-22 DIAGNOSIS — R7303 Prediabetes: Secondary | ICD-10-CM | POA: Diagnosis not present

## 2023-04-27 DIAGNOSIS — K7581 Nonalcoholic steatohepatitis (NASH): Secondary | ICD-10-CM | POA: Diagnosis not present

## 2023-04-27 DIAGNOSIS — Z853 Personal history of malignant neoplasm of breast: Secondary | ICD-10-CM | POA: Diagnosis not present

## 2023-04-27 DIAGNOSIS — M5136 Other intervertebral disc degeneration, lumbar region: Secondary | ICD-10-CM | POA: Diagnosis not present

## 2023-04-27 DIAGNOSIS — I2510 Atherosclerotic heart disease of native coronary artery without angina pectoris: Secondary | ICD-10-CM | POA: Diagnosis not present

## 2023-04-27 DIAGNOSIS — E039 Hypothyroidism, unspecified: Secondary | ICD-10-CM | POA: Diagnosis not present

## 2023-04-27 DIAGNOSIS — M179 Osteoarthritis of knee, unspecified: Secondary | ICD-10-CM | POA: Diagnosis not present

## 2023-04-27 DIAGNOSIS — K8020 Calculus of gallbladder without cholecystitis without obstruction: Secondary | ICD-10-CM | POA: Diagnosis not present

## 2023-04-27 DIAGNOSIS — I10 Essential (primary) hypertension: Secondary | ICD-10-CM | POA: Diagnosis not present

## 2023-04-27 DIAGNOSIS — M25551 Pain in right hip: Secondary | ICD-10-CM | POA: Diagnosis not present

## 2023-04-27 DIAGNOSIS — R7303 Prediabetes: Secondary | ICD-10-CM | POA: Diagnosis not present

## 2023-04-27 DIAGNOSIS — L608 Other nail disorders: Secondary | ICD-10-CM | POA: Diagnosis not present

## 2023-04-27 DIAGNOSIS — E782 Mixed hyperlipidemia: Secondary | ICD-10-CM | POA: Diagnosis not present

## 2023-05-03 ENCOUNTER — Other Ambulatory Visit (HOSPITAL_COMMUNITY): Payer: Self-pay | Admitting: Internal Medicine

## 2023-05-03 DIAGNOSIS — Z1231 Encounter for screening mammogram for malignant neoplasm of breast: Secondary | ICD-10-CM

## 2023-05-04 ENCOUNTER — Encounter: Payer: Self-pay | Admitting: Allergy & Immunology

## 2023-05-04 ENCOUNTER — Ambulatory Visit: Payer: Medicare HMO | Admitting: Allergy & Immunology

## 2023-05-04 VITALS — BP 142/82 | HR 64 | Temp 97.6°F | Resp 16 | Wt 188.1 lb

## 2023-05-04 DIAGNOSIS — J302 Other seasonal allergic rhinitis: Secondary | ICD-10-CM | POA: Diagnosis not present

## 2023-05-04 DIAGNOSIS — J4520 Mild intermittent asthma, uncomplicated: Secondary | ICD-10-CM | POA: Diagnosis not present

## 2023-05-04 DIAGNOSIS — L2089 Other atopic dermatitis: Secondary | ICD-10-CM | POA: Diagnosis not present

## 2023-05-04 DIAGNOSIS — J3089 Other allergic rhinitis: Secondary | ICD-10-CM | POA: Diagnosis not present

## 2023-05-04 DIAGNOSIS — L299 Pruritus, unspecified: Secondary | ICD-10-CM | POA: Diagnosis not present

## 2023-05-04 DIAGNOSIS — J45998 Other asthma: Secondary | ICD-10-CM | POA: Diagnosis not present

## 2023-05-04 NOTE — Progress Notes (Unsigned)
FOLLOW UP  Date of Service/Encounter:  05/04/23   Assessment:   Itching - with some clear environmental allergic triggers    Eczema - on triamcinolone ointment BID PRN (once again declined Dupixent today)   Perennial and seasonal allergic rhinitis (grasses, indoor molds and dust mites)   Elevated absolute eosinophil count of 700 (April 2019)   Mild transaminitisTurtle lover  Plan/Recommendations:   There are no Patient Instructions on file for this visit.   Subjective:   Maria Ritter is a 77 y.o. female presenting today for follow up of No chief complaint on file.   Maria Ritter has a history of the following: Patient Active Problem List   Diagnosis Date Noted   RUQ pain 11/11/2022   Gallstone 11/11/2022   Diverticulosis of colon with hemorrhage 02/02/2022   Hemorrhoids 09/04/2021   Constipation 09/04/2021   Rectal bleeding 09/04/2021   Encounter for screening fecal occult blood testing 06/17/2021   Encounter for well woman exam with routine gynecological exam 06/17/2021   Abnormal stress test 08/02/2019   Postinflammatory skin changes 06/14/2019   Encounter for colorectal cancer screening 06/14/2019   Encounter for gynecological examination with Papanicolaou smear of cervix 06/14/2019   Lymph node enlargement    Flexural atopic dermatitis 01/31/2018   Itching 11/01/2017   Seasonal and perennial allergic rhinitis 11/01/2017   History of breast cancer in female 04/21/2016   Routine cervical smear 04/21/2016   Vaginal atrophy 04/21/2016   OVERWEIGHT 01/28/2010   LEG CRAMPS, NOCTURNAL 01/28/2010   ELECTROCARDIOGRAM, ABNORMAL 01/28/2010   CARCINOMA IN SITU, RIGHT BREAST 11/27/2009   HYPERLIPIDEMIA 11/27/2009   ARM PAIN, LEFT 11/27/2009   HYPOTHYROIDISM 11/26/2009   Essential hypertension 11/26/2009    History obtained from: chart review and {Persons; PED relatives w/patient:19415::"patient"}.  Maria Ritter is a 77 y.o. female presenting for {Blank  single:19197::"a food challenge","a drug challenge","skin testing","a sick visit","an evaluation of ***","a follow up visit"}.  She was last seen in March 2024.  At that time, she was started on a prednisone burst to help get her itching under control.  She was continued on the montelukast as well as the cetirizine, Flonase, and nasal saline rinses.  For her atopic dermatitis, she was continued on triamcinolone as well as clobetasol.  Since last visit,  She did bring some blood work.  It showed an elevated eosinophil count of 500.  Otherwise, metabolic panel was largely normal aside from an increased AST of 58.  Lipids were all within normal limits.   Evidently she has gallstones that were noted in February 2024. This was done due to some stomach pain and noted cholelithiasis without signs of inflammation.  She was married in her mid 43s. She was in nursing school at the time.   Today, she is concerned   She took her neighbor to the doctor and it ended up being something else. Anyway he has full blown COVID19 and her neighbor is on Paxlovid. She is wondering about when she can hang out with him again.    Asthma/Respiratory Symptom History: She reports that she has a "faint wheezing" when she lays on her side. She will lay on her side and breathes out and she hears a faint wheezing when she breathes out. She did snore at one point years ago but she is not sure if she has this typically.   {Blank single:19197::"Allergic Rhinitis Symptom History: ***"," "}  {Blank single:19197::"Food Allergy Symptom History: ***"," "}  She continues to see Dr. Dierdre Forth and  he thought that this might be psoriasis. She is more than willing to be a biopsy. She is being treated for arthritis, including MTX as well. The MTX was discontinued due to elevated liver enzymes.   Skin Symptom History: She did see Dermatology recently. She went to see Dr. Orvan Falconer but he did not do a biopsy.   {Blank single:19197::"GERD  Symptom History: ***"," "}  Otherwise, there have been no changes to her past medical history, surgical history, family history, or social history.    Review of systems otherwise negative other than that mentioned in the HPI.    Objective:   There were no vitals taken for this visit. There is no height or weight on file to calculate BMI.    Physical Exam   Diagnostic studies: {Blank single:19197::"none","deferred due to recent antihistamine use","labs sent instead"," "}  Spirometry: results normal (FEV1: 1.49/78%, FVC: 1.87/75%, FEV1/FVC: 80%).    {Blank single:19197::"Spirometry consistent with mild obstructive disease","Spirometry consistent with moderate obstructive disease","Spirometry consistent with severe obstructive disease","Spirometry consistent with possible restrictive disease","Spirometry consistent with mixed obstructive and restrictive disease","Spirometry uninterpretable due to technique","Spirometry consistent with normal pattern"}. {Blank single:19197::"Albuterol/Atrovent nebulizer","Xopenex/Atrovent nebulizer","Albuterol nebulizer","Albuterol four puffs via MDI","Xopenex four puffs via MDI"} treatment given in clinic with {Blank single:19197::"significant improvement in FEV1 per ATS criteria","significant improvement in FVC per ATS criteria","significant improvement in FEV1 and FVC per ATS criteria","improvement in FEV1, but not significant per ATS criteria","improvement in FVC, but not significant per ATS criteria","improvement in FEV1 and FVC, but not significant per ATS criteria","no improvement"}.  Allergy Studies: {Blank single:19197::"none","labs sent instead"," "}    {Blank single:19197::"Allergy testing results were read and interpreted by myself, documented by clinical staff."," "}      Malachi Bonds, MD  [facility]

## 2023-05-04 NOTE — Patient Instructions (Addendum)
1. Itching with sensitizations to grasses, indoor molds, dust mites - Continue with  Xyzal (levocetirizine) 5 mg at night. - Continue with montelukast 10mg  at night.   2. Eczema - Continue with moisturizing twice daily as as you are doing. - Continue with triamcinolone 0.1% ointment (LARGE TUB) for the lesions on your thighs and elbows.  - Continue with clobetasol 0.05% ointment twice daily for TWO WEEKS at a time (can use with thick skinned areas, avoiding the face, fingers, axilla, and groin). - I would recommend that you get a SKIN BIOPSY to look at whether this is psoriasis or not.  - I would strongly consider Dupixent (at least 3 injections) to see if this helps at all.  - It could improve your quality of life tremendously.   3. Questions about vaccinations - There are studies showing equal efficacy with COVID + flu vaccine versus each separately.  - I would wait 2-4 weeks to get the RSV vaccine after the COVID and flu separately.  4. Concern for wheezing - Lung testing looks amazing today.  - I do not think that you need albuterol, but we could send one in just in case to use when you have the wheezing. - I think a sleep study would be more useful to evaluate for a floppy airway (ie when you fall asleep, your muscle tone in your throat decreases and can obstruct air movent). - But if you are not going to use a CPAP machine, this might be a moot point.   5. Return in about 3 months (around 08/03/2023).    Please inform us of any Emergency Department visits, hospitalizations, or changes in symptoms. Call us before going to the ED for breathing or allergy symptoms since we might be able to fit you in for a sick visit. Feel free to contact us anytime with any questions, problems, or concerns.  It was a pleasure to see you again today!  Websites that have reliable patient information: 1. American Academy of Asthma, Allergy, and Immunology: www.aaaai.org 2. Food Allergy Research and  Education (FARE): foodallergy.org 3. Mothers of Asthmatics: http://www.asthmacommunitynetwork.org 4. American College of Allergy, Asthma, and Immunology: www.acaai.org   COVID-19 Vaccine Information can be found at: [email or web address].[email or web address] For questions related to vaccine distribution or appointments, please email [email or web address] or call [phone].   We realize that you might be concerned about having an allergic reaction to the COVID19 vaccines. To help with that concern, WE ARE OFFERING THE COVID19 VACCINES IN OUR OFFICE! Ask the front desk for dates!     "Like" [place] on Facebook and Instagram for our latest updates!      A healthy democracy works best when [employer] participate! Make sure you are registered to vote! If you have moved or changed any of your contact information, you will need to get this updated before voting!  In some cases, you MAY be able to register to vote online: [email or web address]

## 2023-05-05 MED ORDER — ALBUTEROL SULFATE HFA 108 (90 BASE) MCG/ACT IN AERS: 2.0000 | INHALATION_SPRAY | Freq: Four times a day (QID) | RESPIRATORY_TRACT | 2 refills | Status: DC | PRN

## 2023-05-12 ENCOUNTER — Telehealth: Payer: Self-pay | Admitting: *Deleted

## 2023-05-12 DIAGNOSIS — Z23 Encounter for immunization: Secondary | ICD-10-CM | POA: Diagnosis not present

## 2023-05-12 NOTE — Telephone Encounter (Signed)
-----   Message from Alfonse Spruce sent at 05/05/2023  6:06 AM EDT ----- I think she might be open to Dupixent... or at least a few doses to see if it helps with the itching.

## 2023-05-12 NOTE — Telephone Encounter (Signed)
Called patient and discussed Dupixent and PAP. She is wanting to wait until she sees dermatology before proceeding to Dupixent. I left my number for her to reach back out if she decides to proceed with therapy

## 2023-05-13 DIAGNOSIS — Z961 Presence of intraocular lens: Secondary | ICD-10-CM | POA: Diagnosis not present

## 2023-05-13 DIAGNOSIS — I10 Essential (primary) hypertension: Secondary | ICD-10-CM | POA: Diagnosis not present

## 2023-05-13 DIAGNOSIS — H02052 Trichiasis without entropian right lower eyelid: Secondary | ICD-10-CM | POA: Diagnosis not present

## 2023-05-13 DIAGNOSIS — H35371 Puckering of macula, right eye: Secondary | ICD-10-CM | POA: Diagnosis not present

## 2023-05-13 DIAGNOSIS — H43813 Vitreous degeneration, bilateral: Secondary | ICD-10-CM | POA: Diagnosis not present

## 2023-05-13 DIAGNOSIS — H40013 Open angle with borderline findings, low risk, bilateral: Secondary | ICD-10-CM | POA: Diagnosis not present

## 2023-05-13 DIAGNOSIS — Z79899 Other long term (current) drug therapy: Secondary | ICD-10-CM | POA: Diagnosis not present

## 2023-05-13 NOTE — Telephone Encounter (Signed)
I anticipate that she will drag this out.

## 2023-05-23 DIAGNOSIS — L280 Lichen simplex chronicus: Secondary | ICD-10-CM | POA: Diagnosis not present

## 2023-05-23 DIAGNOSIS — D485 Neoplasm of uncertain behavior of skin: Secondary | ICD-10-CM | POA: Diagnosis not present

## 2023-05-24 DIAGNOSIS — Z79899 Other long term (current) drug therapy: Secondary | ICD-10-CM | POA: Diagnosis not present

## 2023-05-24 DIAGNOSIS — I10 Essential (primary) hypertension: Secondary | ICD-10-CM | POA: Diagnosis not present

## 2023-05-31 DIAGNOSIS — I739 Peripheral vascular disease, unspecified: Secondary | ICD-10-CM | POA: Diagnosis not present

## 2023-07-25 DIAGNOSIS — Z85828 Personal history of other malignant neoplasm of skin: Secondary | ICD-10-CM | POA: Diagnosis not present

## 2023-07-25 DIAGNOSIS — L309 Dermatitis, unspecified: Secondary | ICD-10-CM | POA: Diagnosis not present

## 2023-07-25 DIAGNOSIS — L570 Actinic keratosis: Secondary | ICD-10-CM | POA: Diagnosis not present

## 2023-07-29 ENCOUNTER — Other Ambulatory Visit (HOSPITAL_COMMUNITY): Payer: Self-pay | Admitting: Internal Medicine

## 2023-07-29 DIAGNOSIS — M8580 Other specified disorders of bone density and structure, unspecified site: Secondary | ICD-10-CM

## 2023-08-01 DIAGNOSIS — L4059 Other psoriatic arthropathy: Secondary | ICD-10-CM | POA: Diagnosis not present

## 2023-08-02 NOTE — Patient Instructions (Incomplete)
Allergic rhinitis Continue allergen avoidance measures directed toward grass pollen, mold, and dust mites as listed below Continue montelukast 10 mg once a day to control allergy symptoms and cough Continue Xyzal 5 mg once a day as needed for a runny nose or itch Continue Flonase 2 sprays in each nostril once a day as needed for a stuffy nose Consider saline nasal rinses as needed for nasal symptoms. Use this before any medicated nasal sprays for best result For thick post nasal drainage begin Mucinex [phone] mg twice a day  Atopic dermatitis/pruritus Continue with moisturizing twice daily as as you are doing. Continue with triamcinolone 0.5% ointment (LARGE TUB) for the lesions on your thighs and elbows.   Wheeze Continue albuterol 2 puffs once every 4 hours if needed for cough or wheeze You may use albuterol 2 puffs 5 to 15 minutes before activity to decrease cough or wheeze  Chest tightness/angina Follow up with your primary care provider or cardiologist for evaluation and treatment of chest tightness with activity which resolves with rest  Call the clinic if this treatment plan is not working well for you  Follow up in 2 months or sooner if needed.

## 2023-08-02 NOTE — Progress Notes (Signed)
[address] [place] [address] Dept: [phone]  FOLLOW UP NOTE  Patient ID: Maria Ritter, female    DOB: February 04, 1946  Age: 77 y.o. MRN: [id number] Date of Office Visit: 08/03/2023  Assessment  Chief Complaint: Pruritus (Says she is doing okay. Still has issues in her legs and on her arms near the elbow at times. ) and Asthma (Says she is unsure if she has asthma. Moderate chest pains while waking. Not everyday but sporadic. )  HPI Maria Ritter is a 77 year old female who presents to the clinic for follow-up visit.  She was last seen in this clinic on 05/04/2023 by Dr. Dellis Anes for evaluation of allergic rhinitis, itch, atopic dermatitis, wheeze, and transaminitis.    At today's visit, she reports that she occasionally experiences chest tightness and bilateral arm pain, more pain in the left arm, with activity, specifically walking.  She reports that she stops to rest when she experiences the symptoms with relief and 30 seconds to 1 minute.  She denies chest pain, palpitations, sweating, or confusion during these episodes.  She reports that she has not used her albuterol during these episodes.  She currently reports occasional shortness of breath with activity, occasional dry cough and infrequent wheezing.  She continues montelukast 10 mg once a day and rarely uses albuterol.  We had a detailed discussion regarding asthma symptoms and symptoms of angina resolving with rest.  She has an appointment with her primary care provider in the morning and will ask them to evaluate the possibility of angina resolving with rest in more detail.  Allergic rhinitis is reported as moderately well-controlled with occasional sneezing and occasional postnasal drainage as the main symptoms.  She continues Xyzol and montelukast daily with relief of symptoms. Her last environmental allergy skin testing was on 11/01/2017 and was positive to grass pollen, indoor mold, and dust mites.  Atopic  dermatitis is reported as moderately well-controlled with red and itchy areas occurring mainly on her thighs.  She has recently seen her dermatologist, Dr. Marcelino Duster at [facility] and skin surgery center in [facility] who performed a skin shave.  The result indicated the differential diagnosis includes contact dermatitis, nummular dermatitis, id reaction, and spongiotic drug eruption.  Her dermatologist recommended triamcinolone 0.5% for red and itchy areas.  She reports a significant decrease in her symptoms of pruritus since her last visit to this clinic.  Her current medications are listed in the chart.  Drug Allergies:  Allergies  Allergen Reactions   Mango Flavor Hives   Diclofenac Sodium Other (See Comments)    Too strong, bloating, didn't agree with stomach   Erythromycin Nausea Only   Ivp Dye [Iodinated Contrast Media]    Penicillamine Other (See Comments)   Penicillins Itching    Did it involve swelling of the face/tongue/throat, SOB, or low BP? No Did it involve sudden or severe rash/hives, skin peeling, or any reaction on the inside of your mouth or nose? No Did you need to seek medical attention at a hospital or doctor's office? No When did it last happen?   10 years ago   If all above answers are "NO", may proceed with cephalosporin use.    Red Dye #40 (Allura Red) Other (See Comments)   Sulfonamide Derivatives Itching   Neosporin [Bacitracin-Polymyxin B] Itching   Tape Hives and Rash    Tape or gel    Physical Exam: BP (!) 156/68   Pulse 73   Temp  98.2 F (36.8 C) (Temporal)   Resp 16   Ht 5\' 3"  (1.6 m)   Wt 187 lb 9.6 oz (85.1 kg)   SpO2 96%   BMI 33.23 kg/m    Physical Exam Vitals reviewed.  Constitutional:      Appearance: Normal appearance.  HENT:     Head: Normocephalic and atraumatic.     Right Ear: Tympanic membrane normal.     Left Ear: Tympanic membrane normal.     Nose:     Comments: Bilateral nares slightly erythematous with  thin clear nasal drainage noted.  Pharynx normal.  Ears normal.  Eyes normal.    Mouth/Throat:     Pharynx: Oropharynx is clear.  Eyes:     Conjunctiva/sclera: Conjunctivae normal.  Cardiovascular:     Rate and Rhythm: Normal rate and regular rhythm.     Heart sounds: Normal heart sounds. No murmur heard. Pulmonary:     Effort: Pulmonary effort is normal.     Breath sounds: Normal breath sounds.     Comments: Lungs clear to auscultation Musculoskeletal:        General: Normal range of motion.     Cervical back: Normal range of motion and neck supple.  Skin:    General: Skin is warm and dry.     Comments: Circular red areas noted on her thighs.  No open areas or drainage noted.  Neurological:     Mental Status: She is alert and oriented to person, place, and time.  Psychiatric:        Mood and Affect: Mood normal.        Behavior: Behavior normal.        Thought Content: Thought content normal.        Judgment: Judgment normal.    Assessment and Plan: 1. Mild intermittent asthma, uncomplicated   2. Seasonal and perennial allergic rhinitis   3. Itching   4. Flexural atopic dermatitis   5. Chest tightness     Patient Instructions  Allergic rhinitis Continue allergen avoidance measures directed toward grass pollen, mold, and dust mites as listed below Continue montelukast 10 mg once a day to control allergy symptoms and cough Continue Xyzal 5 mg once a day as needed for a runny nose or itch Continue Flonase 2 sprays in each nostril once a day as needed for a stuffy nose Consider saline nasal rinses as needed for nasal symptoms. Use this before any medicated nasal sprays for best result For thick post nasal drainage begin Mucinex [phone] mg twice a day  Atopic dermatitis/pruritus Continue with moisturizing twice daily as as you are doing. Continue with triamcinolone 0.5% ointment (LARGE TUB) for the lesions on your thighs and elbows.   Wheeze Continue albuterol 2 puffs  once every 4 hours if needed for cough or wheeze You may use albuterol 2 puffs 5 to 15 minutes before activity to decrease cough or wheeze  Chest tightness/angina Follow up with your primary care provider or cardiologist for evaluation and treatment of chest tightness with activity which resolves with rest  Call the clinic if this treatment plan is not working well for you  Follow up in 2 months or sooner if needed.   Return in about 2 months (around 10/04/2023), or if symptoms worsen or fail to improve.    Thank you for the opportunity to care for this patient.  Please do not hesitate to contact me with questions.  Thermon Leyland, FNP [facility]

## 2023-08-03 ENCOUNTER — Ambulatory Visit: Payer: Medicare HMO | Admitting: Family Medicine

## 2023-08-03 VITALS — BP 156/68 | HR 73 | Temp 98.2°F | Resp 16 | Ht 63.0 in | Wt 187.6 lb

## 2023-08-03 DIAGNOSIS — J302 Other seasonal allergic rhinitis: Secondary | ICD-10-CM | POA: Diagnosis not present

## 2023-08-03 DIAGNOSIS — L299 Pruritus, unspecified: Secondary | ICD-10-CM

## 2023-08-03 DIAGNOSIS — L2089 Other atopic dermatitis: Secondary | ICD-10-CM

## 2023-08-03 DIAGNOSIS — J4520 Mild intermittent asthma, uncomplicated: Secondary | ICD-10-CM

## 2023-08-03 DIAGNOSIS — J3089 Other allergic rhinitis: Secondary | ICD-10-CM | POA: Diagnosis not present

## 2023-08-03 DIAGNOSIS — R0789 Other chest pain: Secondary | ICD-10-CM

## 2023-08-04 ENCOUNTER — Telehealth: Payer: Self-pay | Admitting: Cardiology

## 2023-08-04 ENCOUNTER — Encounter: Payer: Self-pay | Admitting: Family Medicine

## 2023-08-04 DIAGNOSIS — J4520 Mild intermittent asthma, uncomplicated: Secondary | ICD-10-CM | POA: Diagnosis not present

## 2023-08-04 DIAGNOSIS — R0789 Other chest pain: Secondary | ICD-10-CM | POA: Diagnosis not present

## 2023-08-04 DIAGNOSIS — R0602 Shortness of breath: Secondary | ICD-10-CM

## 2023-08-04 DIAGNOSIS — R079 Chest pain, unspecified: Secondary | ICD-10-CM

## 2023-08-04 NOTE — Telephone Encounter (Signed)
Pt notified that Dr. Diona Browner would like to do a Lexi scan stress test. And have a follow up visit. Msg sent to schedulers.

## 2023-08-04 NOTE — Telephone Encounter (Signed)
Pt c/o of Chest Pain: STAT if CP now or developed within 24 hours  1. Are you having CP right now? No, last felt on Tuesday  2. Are you experiencing any other symptoms (ex. SOB, nausea, vomiting, sweating)? No but when it happens her chest gets tight up towards her throat and then it goes into her left or right arm getting tight as well. It'll continue until she stops to rest or sit down and then it'll fade away.   3. How long have you been experiencing CP? About a month or so   4. Is your CP continuous or coming and going? Coming and going   5. Have you taken Nitroglycerin? No    ?

## 2023-08-04 NOTE — Telephone Encounter (Signed)
Spoke with pt who states that she has chest pain with activity for a month now. She states that when she relaxes the pain goes away. She denies pain at this time. Pt states that when it does hurt it is 5/10. She does c/o SOB with chest pain. Has to stop and rest when getting mail from the mailbox. Pt called PCP and was told to call cardiology. Please advise.

## 2023-08-09 DIAGNOSIS — I739 Peripheral vascular disease, unspecified: Secondary | ICD-10-CM | POA: Diagnosis not present

## 2023-08-12 ENCOUNTER — Ambulatory Visit (HOSPITAL_COMMUNITY)
Admission: RE | Admit: 2023-08-12 | Discharge: 2023-08-12 | Disposition: A | Discharge status: Home / Self Care | Payer: Medicare HMO | Source: Ambulatory Visit | Attending: Cardiology | Admitting: Cardiology

## 2023-08-12 DIAGNOSIS — I2510 Atherosclerotic heart disease of native coronary artery without angina pectoris: Secondary | ICD-10-CM | POA: Diagnosis not present

## 2023-08-12 DIAGNOSIS — R079 Chest pain, unspecified: Secondary | ICD-10-CM | POA: Diagnosis not present

## 2023-08-12 DIAGNOSIS — R0602 Shortness of breath: Secondary | ICD-10-CM | POA: Diagnosis not present

## 2023-08-12 LAB — NM MYOCAR MULTI W/SPECT W/WALL MOTION / EF
Base ST Depression (mm): 0 mm
LV dias vol: 79 mL (ref 46–106)
LV sys vol: 26 mL
Nuc Stress EF: 67 %
Peak HR: 93 {beats}/min
RATE: 0.4
Rest HR: 67 {beats}/min
Rest Nuclear Isotope Dose: 10.5 mCi
SDS: 15
SRS: 1
SSS: 16
ST Depression (mm): 0 mm
Stress Nuclear Isotope Dose: 30.2 mCi
TID: 1.22

## 2023-08-12 MED ORDER — SODIUM CHLORIDE FLUSH 0.9 % IV SOLN
INTRAVENOUS | Status: AC
  Administered 2023-08-12: 10 mL via INTRAVENOUS
  Filled 2023-08-12: qty 10

## 2023-08-12 MED ORDER — TECHNETIUM TC 99M TETROFOSMIN IV KIT
30.2000 | PACK | Freq: Once | INTRAVENOUS | Status: AC | PRN
  Administered 2023-08-12: 30.2 via INTRAVENOUS

## 2023-08-12 MED ORDER — REGADENOSON 0.4 MG/5ML IV SOLN
INTRAVENOUS | Status: AC
  Administered 2023-08-12: 0.4 mg via INTRAVENOUS
  Filled 2023-08-12: qty 5

## 2023-08-12 MED ORDER — TECHNETIUM TC 99M TETROFOSMIN IV KIT
10.5000 | PACK | Freq: Once | INTRAVENOUS | Status: AC | PRN
  Administered 2023-08-12: 10.5 via INTRAVENOUS

## 2023-08-18 ENCOUNTER — Ambulatory Visit: Payer: Medicare HMO | Attending: Cardiology | Admitting: Cardiology

## 2023-08-18 ENCOUNTER — Telehealth: Payer: Self-pay | Admitting: Cardiology

## 2023-08-18 ENCOUNTER — Encounter: Payer: Self-pay | Admitting: Cardiology

## 2023-08-18 VITALS — BP 136/78 | HR 88 | Ht 63.0 in | Wt 186.4 lb

## 2023-08-18 DIAGNOSIS — R9439 Abnormal result of other cardiovascular function study: Secondary | ICD-10-CM

## 2023-08-18 DIAGNOSIS — E782 Mixed hyperlipidemia: Secondary | ICD-10-CM | POA: Diagnosis not present

## 2023-08-18 DIAGNOSIS — I10 Essential (primary) hypertension: Secondary | ICD-10-CM | POA: Diagnosis not present

## 2023-08-18 DIAGNOSIS — I2089 Other forms of angina pectoris: Secondary | ICD-10-CM

## 2023-08-18 DIAGNOSIS — I6523 Occlusion and stenosis of bilateral carotid arteries: Secondary | ICD-10-CM

## 2023-08-18 MED ORDER — ISOSORBIDE MONONITRATE ER 30 MG PO TB24: 15.0000 mg | ORAL_TABLET | Freq: Every evening | ORAL | 1 refills | Status: DC

## 2023-08-18 MED ORDER — NITROGLYCERIN 0.4 MG SL SUBL: 0.4000 mg | SUBLINGUAL_TABLET | SUBLINGUAL | 3 refills | Status: AC | PRN

## 2023-08-18 NOTE — Progress Notes (Signed)
Cardiology Office Note  Date: 08/18/2023   ID: KATI SHAFER, DOB 1946/02/10, MRN [id number]  History of Present Illness: Maria Ritter is a 77 y.o. female last seen in February.  She is here for a follow-up visit.  We discussed her symptoms, she states that she has had recurring exertional chest discomfort for the last several months.  Activity such as walking uphill or walking a longer distance than normal bring on symptoms.  She has not had any resting chest pain.  She did see an allergist earlier this month thinking that symptoms could have been respiratory, was given albuterol MDI but also told to follow-up with cardiology.  I set her up for a [employer] with results outlined below concerning for development of obstructive CAD.  Today we had an extensive discussion regarding the study results and I recommended proceeding with a diagnostic cardiac catheterization to assess for revascularization options.  At this point she is hesitant to pursue further invasive cardiac testing and prefers medical therapy at least in the short-term.  I reviewed her medications.  Current regimen includes aspirin, Lasix as needed, HCTZ, lisinopril, omega-3 supplements, and Crestor.  We discussed addition of low-dose Imdur and also prescription for as needed nitroglycerin.  I reviewed her ECG today which shows normal sinus rhythm with increased voltage.  Physical Exam: VS:  BP 136/78   Pulse 88   Ht 5\' 3"  (1.6 m)   Wt 186 lb 6.4 oz (84.6 kg)   SpO2 98%   BMI 33.02 kg/m , BMI Body mass index is 33.02 kg/m.  Wt Readings from Last 3 Encounters:  08/18/23 186 lb 6.4 oz (84.6 kg)  08/03/23 187 lb 9.6 oz (85.1 kg)  05/04/23 188 lb 2 oz (85.3 kg)    General: Patient appears comfortable at rest. HEENT: Conjunctiva and lids normal. Neck: Supple, no elevated JVP or carotid bruits. Lungs: Clear to auscultation, nonlabored breathing at rest. Cardiac: Regular rate and rhythm, no S3 or significant  systolic murmur, no pericardial rub.  ECG:  An ECG dated 10/19/2022 was personally reviewed today and demonstrated:  Sinus rhythm with low voltage and intermittent PACs.  Labwork:  August 2024: Cholesterol 103, triglycerides 100, HDL 41, LDL 43, TSH 1.15 August 2023: Hemoglobin 13, platelets 228, BUN 10, creatinine 0.74, potassium 4.8, AST 55, ALT 31  Other Studies Reviewed Today:  Lexiscan Myoview 08/12/2023:   Baseline EKG showed normal sinus rhythm, Q waves in V1 and V2, non-specific TWI in leads I and aVL. Stress ECG is negative for ischemia and arrhythmias.   LV perfusion is abnormal. There is evidence of ischemia. There is no evidence of infarction. Defect 1: There is a large defect with mild reduction in uptake present in the apical to basal anterolateral, inferolateral and lateral location(s) that is reversible. There is normal wall motion in the defect area. Consistent with ischemia. Defect 2: There is a medium defect with moderate reduction in uptake present in the apical anterior and apical anteroseptal location(s) that is reversible. There is normal wall motion in the defect area. Consistent with ischemia. Defect 3: There is a large defect with severe reduction in uptake present in the apex location(s) that is reversible. There is normal wall motion in the defect area. Consistent with ischemia.   Left ventricular function is normal. Nuclear stress EF: 67%.   Findings are consistent with ischemia in the LAD territory. The study is high risk.  Assessment and Plan:  1.  Recurrent exertional chest discomfort  concerning for angina.  Patient underwent a Lexiscan Myoview recently as an outpatient suggesting LAD distribution ischemia, LVEF 67%.  She has no previously documented history of obstructive CAD (low risk Myoview in 2017).  Today we discussed these results in detail and I recommended a diagnostic cardiac catheterization to assess for revascularization options.  We discussed the risks  and benefits.  At this point she is hesitant to proceed with testing.  For now we will continue aspirin, lisinopril, and Crestor.  Adding Imdur starting at 15 mg daily and titrating as tolerated.  Also prescription for as needed supplement nitroglycerin.  We will schedule a 6-week visit for further discussion, sooner if needed.  Also discussed ER precautions.  2.  Mild carotid atherosclerosis, asymptomatic and without significant progression by carotid Dopplers in July 2023.  She remains on low-dose aspirin and also Crestor.  Last LDL was 43 in August.   3.  Premature atrial contractions, asymptomatic and documented by ECG.  No history of atrial fibrillation.   4.  Primary hypertension.  Continue with current treatment including lisinopril and HCTZ.  Disposition:  Follow up  6 weeks.  Signed, Jonelle Sidle, M.D., F.A.C.C. [facility] at [facility]

## 2023-08-18 NOTE — Patient Instructions (Addendum)
Medication Instructions:  Your physician has recommended you make the following change in your medication:  Start isosorbide mononitrate 15 mg daily after dinner Nitroglycerin 0.4 mg tablets. Dissolve one under tongue for chest pain every 5 minutes up to 3 doses. If no relief after 2nd dose, proceed to ED. Continue all other medications as prescribed  Labwork:  none  Testing/Procedures: none  Follow-Up: Your physician recommends that you schedule a follow-up appointment in: 6 weeks  Any Other Special Instructions Will Be Listed Below (If Applicable).  If you need a refill on your cardiac medications before your next appointment, please call your pharmacy.

## 2023-08-18 NOTE — Telephone Encounter (Signed)
Per Dr. Diona Browner:  Please see chart and recent telephone communications regarding patient's symptoms and subsequently ordered [facility] for ischemic surveillance.  No prior history of obstructive CAD, although the current study is significantly abnormal and suggests interval development of obstructive CAD as potential cause of her symptoms.  Would go ahead and get her a follow-up arranged ASAP (can see APP) for discussion of diagnostic cardiac catheterization.

## 2023-08-18 NOTE — Telephone Encounter (Signed)
Patient notified of testing results. Patient scheduled to see provider today in [place] d/t APP schedules booked until late Jan.   Pt had no questions at this time.

## 2023-08-18 NOTE — Telephone Encounter (Signed)
Pt is in office today wanting to know if Dr. Diona Browner has read her stress test. She would like to get the results. [phone] is the best number to reach the pt.

## 2023-09-07 ENCOUNTER — Ambulatory Visit: Payer: Medicare HMO | Admitting: Cardiology

## 2023-09-20 DIAGNOSIS — R600 Localized edema: Secondary | ICD-10-CM | POA: Diagnosis not present

## 2023-09-26 ENCOUNTER — Encounter: Payer: Self-pay | Admitting: Internal Medicine

## 2023-09-26 DIAGNOSIS — L608 Other nail disorders: Secondary | ICD-10-CM | POA: Diagnosis not present

## 2023-09-26 DIAGNOSIS — M51369 Other intervertebral disc degeneration, lumbar region without mention of lumbar back pain or lower extremity pain: Secondary | ICD-10-CM | POA: Diagnosis not present

## 2023-09-26 DIAGNOSIS — M25551 Pain in right hip: Secondary | ICD-10-CM | POA: Diagnosis not present

## 2023-09-26 DIAGNOSIS — K7581 Nonalcoholic steatohepatitis (NASH): Secondary | ICD-10-CM | POA: Diagnosis not present

## 2023-09-26 DIAGNOSIS — E039 Hypothyroidism, unspecified: Secondary | ICD-10-CM | POA: Diagnosis not present

## 2023-09-26 DIAGNOSIS — E782 Mixed hyperlipidemia: Secondary | ICD-10-CM | POA: Diagnosis not present

## 2023-09-26 DIAGNOSIS — K8020 Calculus of gallbladder without cholecystitis without obstruction: Secondary | ICD-10-CM | POA: Diagnosis not present

## 2023-09-26 DIAGNOSIS — Z853 Personal history of malignant neoplasm of breast: Secondary | ICD-10-CM | POA: Diagnosis not present

## 2023-09-26 DIAGNOSIS — I2510 Atherosclerotic heart disease of native coronary artery without angina pectoris: Secondary | ICD-10-CM | POA: Diagnosis not present

## 2023-09-26 DIAGNOSIS — M179 Osteoarthritis of knee, unspecified: Secondary | ICD-10-CM | POA: Diagnosis not present

## 2023-09-26 DIAGNOSIS — R7303 Prediabetes: Secondary | ICD-10-CM | POA: Diagnosis not present

## 2023-09-26 DIAGNOSIS — I10 Essential (primary) hypertension: Secondary | ICD-10-CM | POA: Diagnosis not present

## 2023-10-10 ENCOUNTER — Ambulatory Visit: Payer: Medicare HMO | Attending: Cardiology | Admitting: Cardiology

## 2023-10-10 ENCOUNTER — Encounter: Payer: Self-pay | Admitting: Cardiology

## 2023-10-10 VITALS — BP 126/70 | HR 69 | Ht 63.0 in | Wt 182.6 lb

## 2023-10-10 DIAGNOSIS — E782 Mixed hyperlipidemia: Secondary | ICD-10-CM | POA: Diagnosis not present

## 2023-10-10 DIAGNOSIS — I10 Essential (primary) hypertension: Secondary | ICD-10-CM | POA: Diagnosis not present

## 2023-10-10 DIAGNOSIS — I259 Chronic ischemic heart disease, unspecified: Secondary | ICD-10-CM | POA: Diagnosis not present

## 2023-10-10 DIAGNOSIS — I6523 Occlusion and stenosis of bilateral carotid arteries: Secondary | ICD-10-CM | POA: Diagnosis not present

## 2023-10-10 MED ORDER — ISOSORBIDE MONONITRATE ER 30 MG PO TB24: 30.0000 mg | ORAL_TABLET | Freq: Every evening | ORAL | 1 refills | Status: DC

## 2023-10-10 NOTE — Progress Notes (Signed)
 Cardiology Office Note  Date: 10/10/2023   ID: Euphemia, [place] Nov 28, 1945, MRN [id number]  History of Present Illness: Maria Ritter is a 78 y.o. female last seen in December 2024.  She is here for a follow-up visit.  States that she has had less chest discomfort since we started low-dose Imdur  at last visit.  She is doing maintenance cardiac rehabilitation.  I reviewed her medications.  Current regimen includes aspirin, Lasix, Imdur , lisinopril , potassium supplement, Crestor, and as needed nitroglycerin .  We discussed uptitrating Imdur  to 30 mg daily.  I went over her recent lab work which is noted below.  LDL 38.  Physical Exam: VS:  BP 126/70   Pulse 69   Ht 5\' 3"  (1.6 m)   Wt 182 lb 9.6 oz (82.8 kg)   SpO2 96%   BMI 32.35 kg/m , BMI Body mass index is 32.35 kg/m.  Wt Readings from Last 3 Encounters:  10/10/23 182 lb 9.6 oz (82.8 kg)  08/18/23 186 lb 6.4 oz (84.6 kg)  08/03/23 187 lb 9.6 oz (85.1 kg)    General: Patient appears comfortable at rest. HEENT: Conjunctiva and lids normal. Neck: Supple, no elevated JVP or carotid bruits. Lungs: Clear to auscultation, nonlabored breathing at rest. Cardiac: Regular rate and rhythm, no S3 or significant systolic murmur.  ECG:  An ECG dated 08/18/2023 was personally reviewed today and demonstrated:  Normal sinus rhythm with increased voltage.  Labwork:  January 2025 TSH 0.43, free T4 1.64, hemoglobin 13.4, platelets 250, BUN 18, creatinine 0.81, potassium 4.2, AST 48, ALT 30, cholesterol 100, triglycerides 156, HDL 36, LDL 38, hemoglobin A1c 6.1%  Other Studies Reviewed Today:  Lexiscan  Myoview  08/12/2023:   Baseline EKG showed normal sinus rhythm, Q waves in V1 and V2, non-specific TWI in leads I and aVL. Stress ECG is negative for ischemia and arrhythmias.   LV perfusion is abnormal. There is evidence of ischemia. There is no evidence of infarction. Defect 1: There is a large defect with mild reduction in uptake  present in the apical to basal anterolateral, inferolateral and lateral location(s) that is reversible. There is normal wall motion in the defect area. Consistent with ischemia. Defect 2: There is a medium defect with moderate reduction in uptake present in the apical anterior and apical anteroseptal location(s) that is reversible. There is normal wall motion in the defect area. Consistent with ischemia. Defect 3: There is a large defect with severe reduction in uptake present in the apex location(s) that is reversible. There is normal wall motion in the defect area. Consistent with ischemia.   Left ventricular function is normal. Nuclear stress EF: 67%.   Findings are consistent with ischemia in the LAD territory. The study is high risk.  Assessment and Plan:  1.  Ischemic heart disease based on abnormal Lexiscan  Myoview  in December 2024 indicating LAD distribution ischemia.  Cardiac catheterization discussed at last visit and she preferred to hold off with adjustments in medical therapy.  This remains the case today, she is symptomatically improved since addition of Imdur , plan to increase dose to 30 mg daily.  Otherwise continue aspirin, Crestor, and as needed nitroglycerin .  We have discussed ER precautions, otherwise continue exercise as tolerated.   2.  Mild carotid atherosclerosis, asymptomatic and without significant progression by carotid Dopplers in July 2023.  She remains on low-dose aspirin and also Crestor.  LDL 38 in January.   3.  Premature atrial contractions, asymptomatic and documented by ECG.  No  history of atrial fibrillation.   4.  Primary hypertension.  Blood pressure is well-controlled today.  Disposition:  Follow up  3 months.  Signed, Gerard Knight, M.D., F.A.C.C. [facility] at [facility]

## 2023-10-10 NOTE — Patient Instructions (Addendum)
 Medication Instructions:  Your physician has recommended you make the following change in your medication:  Increase isosorbide  mononitrate to 30 mg daily Continue all other medications as prescribed  Labwork: none  Testing/Procedures: none  Follow-Up: Your physician recommends that you schedule a follow-up appointment in: 3 months  Any Other Special Instructions Will Be Listed Below (If Applicable).  If you need a refill on your cardiac medications before your next appointment, please call your pharmacy.

## 2023-10-18 DIAGNOSIS — I739 Peripheral vascular disease, unspecified: Secondary | ICD-10-CM | POA: Diagnosis not present

## 2023-10-21 ENCOUNTER — Ambulatory Visit: Payer: Medicare HMO | Admitting: Family Medicine

## 2023-10-24 DIAGNOSIS — R600 Localized edema: Secondary | ICD-10-CM | POA: Diagnosis not present

## 2023-10-24 DIAGNOSIS — I10 Essential (primary) hypertension: Secondary | ICD-10-CM | POA: Diagnosis not present

## 2023-10-24 DIAGNOSIS — Z7182 Exercise counseling: Secondary | ICD-10-CM | POA: Diagnosis not present

## 2023-10-24 DIAGNOSIS — I259 Chronic ischemic heart disease, unspecified: Secondary | ICD-10-CM | POA: Diagnosis not present

## 2023-10-24 DIAGNOSIS — Z79890 Hormone replacement therapy (postmenopausal): Secondary | ICD-10-CM | POA: Diagnosis not present

## 2023-10-24 DIAGNOSIS — E039 Hypothyroidism, unspecified: Secondary | ICD-10-CM | POA: Diagnosis not present

## 2023-10-24 DIAGNOSIS — I2510 Atherosclerotic heart disease of native coronary artery without angina pectoris: Secondary | ICD-10-CM | POA: Diagnosis not present

## 2023-10-24 DIAGNOSIS — Z713 Dietary counseling and surveillance: Secondary | ICD-10-CM | POA: Diagnosis not present

## 2023-10-24 DIAGNOSIS — E669 Obesity, unspecified: Secondary | ICD-10-CM | POA: Diagnosis not present

## 2023-10-24 DIAGNOSIS — Z79899 Other long term (current) drug therapy: Secondary | ICD-10-CM | POA: Diagnosis not present

## 2023-10-24 DIAGNOSIS — Z6833 Body mass index (BMI) 33.0-33.9, adult: Secondary | ICD-10-CM | POA: Diagnosis not present

## 2023-11-08 ENCOUNTER — Telehealth (HOSPITAL_COMMUNITY): Payer: Self-pay

## 2023-11-08 NOTE — Telephone Encounter (Signed)
 Opened in error

## 2023-11-22 ENCOUNTER — Ambulatory Visit: Payer: Medicare HMO | Admitting: Cardiology

## 2023-11-23 ENCOUNTER — Ambulatory Visit: Payer: Medicare HMO | Admitting: Cardiology

## 2023-12-13 NOTE — Progress Notes (Unsigned)
   [address] [place] [address] Dept: [phone]  FOLLOW UP NOTE  Patient ID: Maria Ritter, female    DOB: 04/26/1946  Age: 78 y.o. MRN: [id number] Date of Office Visit: 12/14/2023  Assessment  Chief Complaint: No chief complaint on file.  HPI [name] is a 78 year old female who presents to the clinic for follow-up visit.  She was last seen in this clinic on 08/03/2023 by Thermon Leyland, FNP, for evaluation of allergic rhinitis, atopic dermatitis, wheeze, and chest tightness.  In the interim, she has followed up with her cardiology specialist as recommended.  Discussed the use of AI scribe software for clinical note transcription with the patient, who gave verbal consent to proceed.  History of Present Illness    Lexiscan Myoview 08/12/2023:   Baseline EKG showed normal sinus rhythm, Q waves in V1 and V2, non-specific TWI in leads I and aVL. Stress ECG is negative for ischemia and arrhythmias.   LV perfusion is abnormal. There is evidence of ischemia. There is no evidence of infarction. Defect 1: There is a large defect with mild reduction in uptake present in the apical to basal anterolateral, inferolateral and lateral location(s) that is reversible. There is normal wall motion in the defect area. Consistent with ischemia. Defect 2: There is a medium defect with moderate reduction in uptake present in the apical anterior and apical anteroseptal location(s) that is reversible. There is normal wall motion in the defect area. Consistent with ischemia. Defect 3: There is a large defect with severe reduction in uptake present in the apex location(s) that is reversible. There is normal wall motion in the defect area. Consistent with ischemia.   Left ventricular function is normal. Nuclear stress EF: 67%.   Findings are consistent with ischemia in the LAD territory. The study is high risk.  Drug Allergies:  Allergies  Allergen Reactions   Mango Flavoring Agent  (Non-Screening) Hives   Diclofenac Sodium Other (See Comments)    Too strong, bloating, didn't agree with stomach   Erythromycin Nausea Only   Ivp Dye [Iodinated Contrast Media]    Penicillamine Other (See Comments)   Penicillins Itching    Did it involve swelling of the face/tongue/throat, SOB, or low BP? No Did it involve sudden or severe rash/hives, skin peeling, or any reaction on the inside of your mouth or nose? No Did you need to seek medical attention at a hospital or doctor's office? No When did it last happen?   10 years ago   If all above answers are "NO", may proceed with cephalosporin use.    Red Dye #40 (Allura Red) Other (See Comments)   Sulfonamide Derivatives Itching   Neosporin [Bacitracin-Polymyxin B] Itching   Tape Hives and Rash    Tape or gel    Physical Exam: There were no vitals taken for this visit.   Physical Exam  Diagnostics:    Assessment and Plan: No diagnosis found.  No orders of the defined types were placed in this encounter.   There are no Patient Instructions on file for this visit.  No follow-ups on file.    Thank you for the opportunity to care for this patient.  Please do not hesitate to contact me with questions.  Thermon Leyland, FNP [facility]

## 2023-12-13 NOTE — Patient Instructions (Incomplete)
 Allergic rhinitis Continue allergen avoidance measures directed toward grass pollen, mold, and dust mites as listed below Continue montelukast 10 mg once a day to control allergy symptoms and cough Continue Xyzal 5 mg once a day as needed for a runny nose or itch Continue Flonase 2 sprays in each nostril once a day as needed for a stuffy nose Consider saline nasal rinses as needed for nasal symptoms. Use this before any medicated nasal sprays for best result For thick post nasal drainage begin Mucinex [id number] mg twice a day  Wheeze Continue albuterol 2 puffs once every 4 hours if needed for cough or wheeze You may use albuterol 2 puffs 5 to 15 minutes before activity to decrease cough or wheeze  Atopic dermatitis/pruritus Continue with moisturizing twice daily as as you are doing. Continue with triamcinolone 0.5% ointment (LARGE TUB) for the lesions on your thighs and elbows.   Allergic contact dermatitis Consider patch testing. Patches are placed on Monday, removed on Wednesday with the first reading also on Wednesday, and the final reading is on Friday.     Follow up in 6 months or sooner if needed.  Reducing Pollen Exposure The American Academy of Allergy, Asthma and Immunology suggests the following steps to reduce your exposure to pollen during allergy seasons. Do not hang sheets or clothing out to dry; pollen may collect on these items. Do not mow lawns or spend time around freshly cut grass; mowing stirs up pollen. Keep windows closed at night.  Keep car windows closed while driving. Minimize morning activities outdoors, a time when pollen counts are usually at their highest. Stay indoors as much as possible when pollen counts or humidity is high and on windy days when pollen tends to remain in the air longer. Use air conditioning when possible.  Many air conditioners have filters that trap the pollen spores. Use a HEPA room air filter to remove pollen form the indoor air you  breathe.  Control of Mold Allergen Mold and fungi can grow on a variety of surfaces provided certain temperature and moisture conditions exist.  Outdoor molds grow on plants, decaying vegetation and soil.  The major outdoor mold, Alternaria and Cladosporium, are found in very high numbers during hot and dry conditions.  Generally, a late Summer - Fall peak is seen for common outdoor fungal spores.  Rain will temporarily lower outdoor mold spore count, but counts rise rapidly when the rainy period ends.  The most important indoor molds are Aspergillus and Penicillium.  Dark, humid and poorly ventilated basements are ideal sites for mold growth.  The next most common sites of mold growth are the bathroom and the kitchen.  Outdoor [employer] Use air conditioning and keep windows closed Avoid exposure to decaying vegetation. Avoid leaf raking. Avoid grain handling. Consider wearing a face mask if working in moldy areas.  Indoor Mold Control Maintain humidity below 50%. Clean washable surfaces with 5% bleach solution. Remove sources e.g. Contaminated carpets.   Control of Dust Mite Allergen Dust mites play a major role in allergic asthma and rhinitis. They occur in environments with high humidity wherever human skin is found. Dust mites absorb humidity from the atmosphere (ie, they do not drink) and feed on organic matter (including shed human and animal skin). Dust mites are a microscopic type of insect that you cannot see with the naked eye. High levels of dust mites have been detected from mattresses, pillows, carpets, upholstered furniture, bed covers, clothes, soft toys and any woven  material. The principal allergen of the dust mite is found in its feces. A gram of dust may contain 1,000 mites and 250,000 fecal particles. Mite antigen is easily measured in the air during house cleaning activities. Dust mites do not bite and do not cause harm to humans, other than by triggering  allergies/asthma.  Ways to decrease your exposure to dust mites in your home:  1. Encase mattresses, box springs and pillows with a mite-impermeable barrier or cover  2. Wash sheets, blankets and drapes weekly in hot water (130 F) with detergent and dry them in a dryer on the hot setting.  3. Have the room cleaned frequently with a vacuum cleaner and a damp dust-mop. For carpeting or rugs, vacuuming with a vacuum cleaner equipped with a high-efficiency particulate air (HEPA) filter. The dust mite allergic individual should not be in a room which is being cleaned and should wait 1 hour after cleaning before going into the room.  4. Do not sleep on upholstered furniture (eg, couches).  5. If possible removing carpeting, upholstered furniture and drapery from the home is ideal. Horizontal blinds should be eliminated in the rooms where the person spends the most time (bedroom, study, television room). Washable vinyl, roller-type shades are optimal.  6. Remove all non-washable stuffed toys from the bedroom. Wash stuffed toys weekly like sheets and blankets above.  7. Reduce indoor humidity to less than 50%. Inexpensive humidity monitors can be purchased at most hardware stores. Do not use a humidifier as can make the problem worse and are not recommended.

## 2023-12-14 ENCOUNTER — Ambulatory Visit: Payer: Medicare HMO | Admitting: Family Medicine

## 2023-12-14 ENCOUNTER — Encounter: Payer: Self-pay | Admitting: Family Medicine

## 2023-12-14 VITALS — BP 118/60 | HR 94 | Temp 97.8°F | Resp 14

## 2023-12-14 DIAGNOSIS — J4520 Mild intermittent asthma, uncomplicated: Secondary | ICD-10-CM

## 2023-12-14 DIAGNOSIS — J3089 Other allergic rhinitis: Secondary | ICD-10-CM | POA: Diagnosis not present

## 2023-12-14 DIAGNOSIS — L299 Pruritus, unspecified: Secondary | ICD-10-CM | POA: Diagnosis not present

## 2023-12-14 DIAGNOSIS — J302 Other seasonal allergic rhinitis: Secondary | ICD-10-CM

## 2023-12-14 DIAGNOSIS — L2089 Other atopic dermatitis: Secondary | ICD-10-CM | POA: Diagnosis not present

## 2023-12-14 MED ORDER — FLUTICASONE PROPIONATE 50 MCG/ACT NA SUSP: 2.0000 | Freq: Every day | NASAL | 5 refills | Status: AC | PRN

## 2023-12-27 DIAGNOSIS — I739 Peripheral vascular disease, unspecified: Secondary | ICD-10-CM | POA: Diagnosis not present

## 2024-01-09 ENCOUNTER — Encounter: Payer: Self-pay | Admitting: Cardiology

## 2024-01-09 ENCOUNTER — Ambulatory Visit: Payer: Medicare HMO | Attending: Cardiology | Admitting: Cardiology

## 2024-01-09 VITALS — BP 128/72 | HR 79 | Ht 63.0 in | Wt 183.0 lb

## 2024-01-09 DIAGNOSIS — I259 Chronic ischemic heart disease, unspecified: Secondary | ICD-10-CM | POA: Diagnosis not present

## 2024-01-09 DIAGNOSIS — I6523 Occlusion and stenosis of bilateral carotid arteries: Secondary | ICD-10-CM

## 2024-01-09 DIAGNOSIS — I10 Essential (primary) hypertension: Secondary | ICD-10-CM

## 2024-01-09 DIAGNOSIS — E782 Mixed hyperlipidemia: Secondary | ICD-10-CM | POA: Diagnosis not present

## 2024-01-09 NOTE — Patient Instructions (Addendum)
 Medication Instructions:   Your physician recommends that you continue on your current medications as directed. Please refer to the Current Medication list given to you today.  Labwork:  none  Testing/Procedures:  none  Follow-Up:  Your physician recommends that you schedule a follow-up appointment in: 3 months.  Any Other Special Instructions Will Be Listed Below (If Applicable).  If you need a refill on your cardiac medications before your next appointment, please call your pharmacy.

## 2024-01-09 NOTE — Progress Notes (Signed)
    Cardiology Office Note  Date: 01/09/2024   ID: JLA FABRIS, DOB 1946-05-02, MRN [id number]  History of Present Illness: Maria Ritter is a 77 y.o. female last seen in February.  She is here for a routine visit.  In the interim she does not report any increasing angina over baseline on current regimen, she has not had to take any sublingual nitroglycerin .  She remains comfortable treating her heart disease with medications at this point.  We went over her medications.  We did discuss potential up titration of Imdur  if necessary.  Blood pressure is reasonable today and her lipids have also been well-controlled with last LDL 38 in January.  She still exercises twice a week in the maintenance rehabilitation program.  Physical Exam: VS:  BP 128/72 (BP Location: Left Arm)   Pulse 79   Ht 5\' 3"  (1.6 m)   Wt 183 lb (83 kg)   SpO2 94%   BMI 32.42 kg/m , BMI Body mass index is 32.42 kg/m.  Wt Readings from Last 3 Encounters:  01/09/24 183 lb (83 kg)  10/10/23 182 lb 9.6 oz (82.8 kg)  08/18/23 186 lb 6.4 oz (84.6 kg)    General: Patient appears comfortable at rest. HEENT: Conjunctiva and lids normal. Neck: Supple, no elevated JVP or carotid bruits. Lungs: Clear to auscultation, nonlabored breathing at rest. Cardiac: Regular rate and rhythm, no S3 or significant systolic murmur. Extremities: No pitting edema.  ECG:  An ECG dated 08/18/2023 was personally reviewed today and demonstrated:  Sinus rhythm with increased voltage.  Labwork:  January 2025: Cholesterol 100, triglycerides 156, HDL 36, LDL 38, hemoglobin 13.4, platelets 250 February 2025: TSH 0.669, BUN 16, creatinine 0.77, potassium 4.6, GFR 79  Other Studies Reviewed Today:  No interval cardiac testing for review today.  Assessment and Plan:  1.  Ischemic heart disease based on abnormal Lexiscan  Myoview  in December 2024 indicating LAD distribution ischemia.  She prefers to hold off on invasive cardiac testing and  continue with medical therapy.  We have discussed warning signs and symptoms, she does not report any accelerating angina.  Currently on aspirin 81 mg daily, Imdur  30 mg daily, Crestor 10 mg daily, and as needed nitroglycerin .   2.  Mild carotid atherosclerosis, asymptomatic and without significant progression by carotid Dopplers in July 2023.  She remains on low-dose aspirin and also Crestor.  LDL 38 in January.   3.  Premature atrial contractions, asymptomatic and documented by ECG.  No history of atrial fibrillation.   4.  Primary hypertension.  Blood pressure is reasonably controlled today.  Continue lisinopril  40 mg daily and HCTZ 12.5 mg daily with potassium supplement.  Disposition:  Follow up 3 months.  Signed, Gerard Knight, M.D., F.A.C.C. [facility] at [facility]

## 2024-02-02 DIAGNOSIS — L821 Other seborrheic keratosis: Secondary | ICD-10-CM | POA: Diagnosis not present

## 2024-02-02 DIAGNOSIS — D225 Melanocytic nevi of trunk: Secondary | ICD-10-CM | POA: Diagnosis not present

## 2024-02-02 DIAGNOSIS — Z1283 Encounter for screening for malignant neoplasm of skin: Secondary | ICD-10-CM | POA: Diagnosis not present

## 2024-02-02 DIAGNOSIS — L258 Unspecified contact dermatitis due to other agents: Secondary | ICD-10-CM | POA: Diagnosis not present

## 2024-02-06 DIAGNOSIS — E663 Overweight: Secondary | ICD-10-CM | POA: Diagnosis not present

## 2024-02-06 DIAGNOSIS — R1011 Right upper quadrant pain: Secondary | ICD-10-CM | POA: Diagnosis not present

## 2024-02-06 DIAGNOSIS — Z6829 Body mass index (BMI) 29.0-29.9, adult: Secondary | ICD-10-CM | POA: Diagnosis not present

## 2024-02-06 DIAGNOSIS — L4059 Other psoriatic arthropathy: Secondary | ICD-10-CM | POA: Diagnosis not present

## 2024-02-06 DIAGNOSIS — M1991 Primary osteoarthritis, unspecified site: Secondary | ICD-10-CM | POA: Diagnosis not present

## 2024-02-06 DIAGNOSIS — R220 Localized swelling, mass and lump, head: Secondary | ICD-10-CM | POA: Diagnosis not present

## 2024-02-06 DIAGNOSIS — M25561 Pain in right knee: Secondary | ICD-10-CM | POA: Diagnosis not present

## 2024-02-06 DIAGNOSIS — M5030 Other cervical disc degeneration, unspecified cervical region: Secondary | ICD-10-CM | POA: Diagnosis not present

## 2024-02-06 DIAGNOSIS — M5441 Lumbago with sciatica, right side: Secondary | ICD-10-CM | POA: Diagnosis not present

## 2024-02-06 DIAGNOSIS — M25551 Pain in right hip: Secondary | ICD-10-CM | POA: Diagnosis not present

## 2024-02-06 DIAGNOSIS — M810 Age-related osteoporosis without current pathological fracture: Secondary | ICD-10-CM | POA: Diagnosis not present

## 2024-02-10 ENCOUNTER — Ambulatory Visit: Admitting: Adult Health

## 2024-02-10 ENCOUNTER — Encounter: Payer: Self-pay | Admitting: Adult Health

## 2024-02-10 VITALS — BP 149/89 | HR 99 | Ht 62.25 in | Wt 180.0 lb

## 2024-02-10 DIAGNOSIS — N6315 Unspecified lump in the right breast, overlapping quadrants: Secondary | ICD-10-CM

## 2024-02-10 DIAGNOSIS — Z853 Personal history of malignant neoplasm of breast: Secondary | ICD-10-CM

## 2024-02-10 DIAGNOSIS — I10 Essential (primary) hypertension: Secondary | ICD-10-CM

## 2024-02-10 NOTE — Progress Notes (Signed)
  Subjective:     Patient ID: Maria Ritter, female   DOB: 02-Aug-1946, 78 y.o.   MRN: [id number]  HPI Maria Ritter is a 78 year old white female, widowed, PM in complaining of thickness right breast, she had breast cancer in 2010 and had radiation too and then about 5 years or more ago had lymph node removed. Denies pain  PCP is Dr Del Favia  Review of Systems Thickness right breast    Hx breast cancer Reviewed past medical,surgical, social and family history. Reviewed medications and allergies.  Objective:   Physical Exam BP (!) 149/89 (BP Location: Left Arm, Patient Position: Sitting, Cuff Size: Normal)   Pulse 99   Ht 5' 2.25 (1.581 m)   Wt 180 lb (81.6 kg)   BMI 32.66 kg/m      Skin warm and dry,  Breasts:no dominate palpable mass, retraction or nipple discharge on the left, has regular irregularities, on the right, no nipple discharge, has thickness around scar and has pea sized mass in thickness at about 9 o'clock, near under arm Fall risk is moderate, using a cane  Assessment:     1. Mass overlapping multiple quadrants of right breast (Primary) has thickness around scar and has pea sized mass in thickness at about 9 o'clock, near under arm Will get schedule bilateral diagnostic mammogram and US  at [facility] and let her know date and time, they were not there today in mammo - US  LIMITED ULTRASOUND INCLUDING AXILLA RIGHT BREAST; Future - MM 3D DIAGNOSTIC MAMMOGRAM BILATERAL BREAST; Future - US  LIMITED ULTRASOUND INCLUDING AXILLA LEFT BREAST ; Future  2. History of breast cancer in female Will get schedule bilateral diagnostic mammogram and US  at [facility] and let her know date and time, they were not there today in mammo - US  LIMITED ULTRASOUND INCLUDING AXILLA RIGHT BREAST; Future - MM 3D DIAGNOSTIC MAMMOGRAM BILATERAL BREAST; Future - US  LIMITED ULTRASOUND INCLUDING AXILLA LEFT BREAST ; Future  3. Essential hypertension Take BP meds and follow up with PCP    Plan:     Follow up prn

## 2024-02-13 ENCOUNTER — Telehealth: Payer: Self-pay | Admitting: Adult Health

## 2024-02-13 NOTE — Telephone Encounter (Signed)
 Maria Ritter is aware that diagnostic mammogram and US  scheduled for 03/27/24 at 10 am at [facility]

## 2024-03-09 DIAGNOSIS — G4700 Insomnia, unspecified: Secondary | ICD-10-CM | POA: Diagnosis not present

## 2024-03-09 DIAGNOSIS — I10 Essential (primary) hypertension: Secondary | ICD-10-CM | POA: Diagnosis not present

## 2024-03-09 DIAGNOSIS — Z79899 Other long term (current) drug therapy: Secondary | ICD-10-CM | POA: Diagnosis not present

## 2024-03-20 DIAGNOSIS — I739 Peripheral vascular disease, unspecified: Secondary | ICD-10-CM | POA: Diagnosis not present

## 2024-03-22 DIAGNOSIS — E039 Hypothyroidism, unspecified: Secondary | ICD-10-CM | POA: Diagnosis not present

## 2024-03-22 DIAGNOSIS — E559 Vitamin D deficiency, unspecified: Secondary | ICD-10-CM | POA: Diagnosis not present

## 2024-03-22 DIAGNOSIS — E782 Mixed hyperlipidemia: Secondary | ICD-10-CM | POA: Diagnosis not present

## 2024-03-22 DIAGNOSIS — R7303 Prediabetes: Secondary | ICD-10-CM | POA: Diagnosis not present

## 2024-03-26 DIAGNOSIS — I2510 Atherosclerotic heart disease of native coronary artery without angina pectoris: Secondary | ICD-10-CM | POA: Diagnosis not present

## 2024-03-26 DIAGNOSIS — I6529 Occlusion and stenosis of unspecified carotid artery: Secondary | ICD-10-CM | POA: Diagnosis not present

## 2024-03-26 DIAGNOSIS — M51369 Other intervertebral disc degeneration, lumbar region without mention of lumbar back pain or lower extremity pain: Secondary | ICD-10-CM | POA: Diagnosis not present

## 2024-03-26 DIAGNOSIS — K7581 Nonalcoholic steatohepatitis (NASH): Secondary | ICD-10-CM | POA: Diagnosis not present

## 2024-03-26 DIAGNOSIS — R7303 Prediabetes: Secondary | ICD-10-CM | POA: Diagnosis not present

## 2024-03-26 DIAGNOSIS — I259 Chronic ischemic heart disease, unspecified: Secondary | ICD-10-CM | POA: Diagnosis not present

## 2024-03-26 DIAGNOSIS — R600 Localized edema: Secondary | ICD-10-CM | POA: Diagnosis not present

## 2024-03-26 DIAGNOSIS — I10 Essential (primary) hypertension: Secondary | ICD-10-CM | POA: Diagnosis not present

## 2024-03-26 DIAGNOSIS — E782 Mixed hyperlipidemia: Secondary | ICD-10-CM | POA: Diagnosis not present

## 2024-03-26 DIAGNOSIS — M179 Osteoarthritis of knee, unspecified: Secondary | ICD-10-CM | POA: Diagnosis not present

## 2024-03-26 DIAGNOSIS — M25551 Pain in right hip: Secondary | ICD-10-CM | POA: Diagnosis not present

## 2024-03-26 DIAGNOSIS — E039 Hypothyroidism, unspecified: Secondary | ICD-10-CM | POA: Diagnosis not present

## 2024-03-27 ENCOUNTER — Ambulatory Visit (HOSPITAL_COMMUNITY)
Admission: RE | Admit: 2024-03-27 | Discharge: 2024-03-27 | Disposition: A | Discharge status: Home / Self Care | Source: Ambulatory Visit | Attending: Adult Health | Admitting: Adult Health

## 2024-03-27 ENCOUNTER — Inpatient Hospital Stay (HOSPITAL_COMMUNITY)
Admission: RE | Admit: 2024-03-27 | Discharge: 2024-03-27 | Discharge status: Home / Self Care | Source: Ambulatory Visit | Attending: Internal Medicine | Admitting: Internal Medicine

## 2024-03-27 ENCOUNTER — Ambulatory Visit: Payer: Self-pay | Admitting: Adult Health

## 2024-03-27 DIAGNOSIS — M810 Age-related osteoporosis without current pathological fracture: Secondary | ICD-10-CM | POA: Diagnosis not present

## 2024-03-27 DIAGNOSIS — Z853 Personal history of malignant neoplasm of breast: Secondary | ICD-10-CM

## 2024-03-27 DIAGNOSIS — N6315 Unspecified lump in the right breast, overlapping quadrants: Secondary | ICD-10-CM

## 2024-03-27 DIAGNOSIS — Z13820 Encounter for screening for osteoporosis: Secondary | ICD-10-CM | POA: Diagnosis not present

## 2024-03-27 DIAGNOSIS — M8580 Other specified disorders of bone density and structure, unspecified site: Secondary | ICD-10-CM

## 2024-03-27 DIAGNOSIS — Z923 Personal history of irradiation: Secondary | ICD-10-CM | POA: Diagnosis not present

## 2024-03-27 DIAGNOSIS — R928 Other abnormal and inconclusive findings on diagnostic imaging of breast: Secondary | ICD-10-CM | POA: Diagnosis not present

## 2024-03-27 DIAGNOSIS — R92323 Mammographic fibroglandular density, bilateral breasts: Secondary | ICD-10-CM | POA: Diagnosis not present

## 2024-03-27 NOTE — Telephone Encounter (Signed)
 Pt aware of mammogram and US , screening mammogram in 1 year

## 2024-04-05 DIAGNOSIS — R2689 Other abnormalities of gait and mobility: Secondary | ICD-10-CM | POA: Diagnosis not present

## 2024-04-09 DIAGNOSIS — R2689 Other abnormalities of gait and mobility: Secondary | ICD-10-CM | POA: Diagnosis not present

## 2024-04-12 DIAGNOSIS — R2689 Other abnormalities of gait and mobility: Secondary | ICD-10-CM | POA: Diagnosis not present

## 2024-04-16 DIAGNOSIS — R2689 Other abnormalities of gait and mobility: Secondary | ICD-10-CM | POA: Diagnosis not present

## 2024-04-23 DIAGNOSIS — R2689 Other abnormalities of gait and mobility: Secondary | ICD-10-CM | POA: Diagnosis not present

## 2024-04-25 ENCOUNTER — Other Ambulatory Visit (HOSPITAL_BASED_OUTPATIENT_CLINIC_OR_DEPARTMENT_OTHER): Payer: Self-pay

## 2024-04-25 ENCOUNTER — Encounter: Payer: Self-pay | Admitting: Cardiology

## 2024-04-25 ENCOUNTER — Ambulatory Visit: Attending: Cardiology | Admitting: Cardiology

## 2024-04-25 ENCOUNTER — Encounter: Payer: Self-pay | Admitting: *Deleted

## 2024-04-25 VITALS — BP 126/72 | HR 78 | Ht 65.0 in | Wt 180.6 lb

## 2024-04-25 DIAGNOSIS — I259 Chronic ischemic heart disease, unspecified: Secondary | ICD-10-CM | POA: Diagnosis not present

## 2024-04-25 DIAGNOSIS — I10 Essential (primary) hypertension: Secondary | ICD-10-CM

## 2024-04-25 DIAGNOSIS — E782 Mixed hyperlipidemia: Secondary | ICD-10-CM

## 2024-04-25 MED ORDER — ISOSORBIDE MONONITRATE ER 30 MG PO TB24: 30.0000 mg | ORAL_TABLET | Freq: Every evening | ORAL | 2 refills | Status: DC

## 2024-04-25 MED ORDER — ISOSORBIDE MONONITRATE ER 30 MG PO TB24
30.0000 mg | ORAL_TABLET | Freq: Every evening | ORAL | 2 refills | Status: DC
  Filled 2024-04-25: qty 120, 60d supply, fill #0

## 2024-04-25 MED ORDER — RALOXIFENE HCL 60 MG PO TABS
60.0000 mg | ORAL_TABLET | Freq: Every day | ORAL | 0 refills | Status: DC
  Filled 2024-04-25: qty 30, 30d supply, fill #0

## 2024-04-25 NOTE — Addendum Note (Signed)
 Addended by: Kimla Furth M on: 04/25/2024 10:21 AM   Modules accepted: Orders

## 2024-04-25 NOTE — Progress Notes (Signed)
    Cardiology Office Note  Date: 04/25/2024   ID: Melvin, Marmo 03/23/1946, MRN [id number]  History of Present Illness: Maria Ritter is a 78 y.o. female last seen in May.  She is here for a routine visit.  She reports recurring exertional angina as before, no increasing pattern.  She still prefers medical therapy if possible, we have discussed cardiac catheterization and revascularization as well.  We discussed her medications.  No specific changes noted in cardiac regimen.  Blood pressure control is adequate today.  Her LDL was 47 by lab work in July.  She would like to have some extra Imdur  tablets to take on an as-needed basis during the daytime, this will be refilled.  Physical Exam: VS:  BP 126/72 (BP Location: Left Arm)   Pulse 78   Ht 5' 5 (1.651 m)   Wt 180 lb 9.6 oz (81.9 kg)   SpO2 96%   BMI 30.05 kg/m , BMI Body mass index is 30.05 kg/m.  Wt Readings from Last 3 Encounters:  04/25/24 180 lb 9.6 oz (81.9 kg)  02/10/24 180 lb (81.6 kg)  01/09/24 183 lb (83 kg)    General: Patient appears comfortable at rest. HEENT: Conjunctiva and lids normal. Neck: Supple, no elevated JVP or carotid bruits. Lungs: Clear to auscultation, nonlabored breathing at rest. Cardiac: Regular rate and rhythm, no S3 or significant systolic murmur.  ECG:  An ECG dated 08/18/2023 was personally reviewed today and demonstrated:  Sinus rhythm with increased voltage.  Labwork:  July 2025: Hemoglobin A1c 6.1%, cholesterol 109, triglycerides 157, HDL 35, LDL 47, BUN 19, creatinine 0.71, GFR 87, potassium 4.4, AST 42, ALT 28, hemoglobin 12.9, platelets 216, TSH 0.594  Other Studies Reviewed Today:  No interval cardiac testing for review today.  Assessment and Plan:  1.  Ischemic heart disease based on abnormal Lexiscan  Myoview  in December 2024 indicating LAD distribution ischemia.  She prefers to hold off on invasive cardiac testing and continue with medical therapy if possible.   Exertional angina remains stable in terms of intensity and pattern.  Plan to refill Imdur  30 mg daily with additional tablets to take as needed.  We did discuss uptitrating to twice daily as well.  Otherwise continue aspirin 81 mg daily, Crestor 10 mg daily, and as needed nitroglycerin .  LDL 47 in July.   2.  Mild carotid atherosclerosis, asymptomatic and without significant progression by carotid Dopplers in July 2023.   3.  Premature atrial contractions, asymptomatic and documented by ECG.  No history of atrial fibrillation.   4.  Primary hypertension.  Blood pressure is adequately controlled today.  Disposition:  Follow up 4 months.  Signed, Jayson JUDITHANN Sierras, M.D., F.A.C.C. [facility] at [facility]

## 2024-04-25 NOTE — Patient Instructions (Addendum)
 Medication Instructions:  Your physician recommends that you continue on your current medications as directed. Please refer to the Current Medication list given to you today. You may take an additional imdur  30 mg daily as needed.  Labwork: none  Testing/Procedures: none  Follow-Up: Your physician recommends that you schedule a follow-up appointment in: 4 months in [place].  Any Other Special Instructions Will Be Listed Below (If Applicable).  If you need a refill on your cardiac medications before your next appointment, please call your pharmacy.`

## 2024-04-25 NOTE — Telephone Encounter (Signed)
 This encounter was created in error - please disregard.

## 2024-04-26 ENCOUNTER — Ambulatory Visit (HOSPITAL_COMMUNITY): Payer: Medicare HMO

## 2024-04-26 DIAGNOSIS — R2689 Other abnormalities of gait and mobility: Secondary | ICD-10-CM | POA: Diagnosis not present

## 2024-05-01 DIAGNOSIS — R2689 Other abnormalities of gait and mobility: Secondary | ICD-10-CM | POA: Diagnosis not present

## 2024-05-04 DIAGNOSIS — R2689 Other abnormalities of gait and mobility: Secondary | ICD-10-CM | POA: Diagnosis not present

## 2024-05-04 DIAGNOSIS — Z23 Encounter for immunization: Secondary | ICD-10-CM | POA: Diagnosis not present

## 2024-05-07 DIAGNOSIS — R2689 Other abnormalities of gait and mobility: Secondary | ICD-10-CM | POA: Diagnosis not present

## 2024-05-10 DIAGNOSIS — R2689 Other abnormalities of gait and mobility: Secondary | ICD-10-CM | POA: Diagnosis not present

## 2024-05-14 DIAGNOSIS — R2689 Other abnormalities of gait and mobility: Secondary | ICD-10-CM | POA: Diagnosis not present

## 2024-05-16 DIAGNOSIS — H35371 Puckering of macula, right eye: Secondary | ICD-10-CM | POA: Diagnosis not present

## 2024-05-16 DIAGNOSIS — H43813 Vitreous degeneration, bilateral: Secondary | ICD-10-CM | POA: Diagnosis not present

## 2024-05-16 DIAGNOSIS — H40013 Open angle with borderline findings, low risk, bilateral: Secondary | ICD-10-CM | POA: Diagnosis not present

## 2024-05-21 DIAGNOSIS — R2689 Other abnormalities of gait and mobility: Secondary | ICD-10-CM | POA: Diagnosis not present

## 2024-05-25 DIAGNOSIS — R2689 Other abnormalities of gait and mobility: Secondary | ICD-10-CM | POA: Diagnosis not present

## 2024-05-29 ENCOUNTER — Encounter (HOSPITAL_COMMUNITY): Payer: Self-pay | Admitting: *Deleted

## 2024-05-31 DIAGNOSIS — I739 Peripheral vascular disease, unspecified: Secondary | ICD-10-CM | POA: Diagnosis not present

## 2024-06-14 DIAGNOSIS — D485 Neoplasm of uncertain behavior of skin: Secondary | ICD-10-CM | POA: Diagnosis not present

## 2024-06-14 DIAGNOSIS — L570 Actinic keratosis: Secondary | ICD-10-CM | POA: Diagnosis not present

## 2024-06-14 DIAGNOSIS — X32XXXD Exposure to sunlight, subsequent encounter: Secondary | ICD-10-CM | POA: Diagnosis not present

## 2024-06-14 DIAGNOSIS — L821 Other seborrheic keratosis: Secondary | ICD-10-CM | POA: Diagnosis not present

## 2024-06-14 DIAGNOSIS — L814 Other melanin hyperpigmentation: Secondary | ICD-10-CM | POA: Diagnosis not present

## 2024-06-15 ENCOUNTER — Ambulatory Visit: Admitting: Family Medicine

## 2024-07-04 ENCOUNTER — Encounter (INDEPENDENT_AMBULATORY_CARE_PROVIDER_SITE_OTHER): Payer: Self-pay | Admitting: Gastroenterology

## 2024-07-11 DIAGNOSIS — J069 Acute upper respiratory infection, unspecified: Secondary | ICD-10-CM | POA: Diagnosis not present

## 2024-08-01 DIAGNOSIS — E782 Mixed hyperlipidemia: Secondary | ICD-10-CM | POA: Diagnosis not present

## 2024-08-01 DIAGNOSIS — E559 Vitamin D deficiency, unspecified: Secondary | ICD-10-CM | POA: Diagnosis not present

## 2024-08-01 DIAGNOSIS — R7303 Prediabetes: Secondary | ICD-10-CM | POA: Diagnosis not present

## 2024-08-01 DIAGNOSIS — E039 Hypothyroidism, unspecified: Secondary | ICD-10-CM | POA: Diagnosis not present

## 2024-08-08 ENCOUNTER — Encounter: Payer: Self-pay | Admitting: Cardiology

## 2024-08-08 ENCOUNTER — Ambulatory Visit: Attending: Cardiology | Admitting: Cardiology

## 2024-08-08 VITALS — BP 138/82 | HR 72 | Ht 66.0 in | Wt 184.0 lb

## 2024-08-08 DIAGNOSIS — I259 Chronic ischemic heart disease, unspecified: Secondary | ICD-10-CM | POA: Diagnosis not present

## 2024-08-08 DIAGNOSIS — E782 Mixed hyperlipidemia: Secondary | ICD-10-CM | POA: Diagnosis not present

## 2024-08-08 DIAGNOSIS — I10 Essential (primary) hypertension: Secondary | ICD-10-CM

## 2024-08-08 MED ORDER — ISOSORBIDE MONONITRATE ER 30 MG PO TB24: 30.0000 mg | ORAL_TABLET | Freq: Two times a day (BID) | ORAL | 2 refills | Status: AC

## 2024-08-08 NOTE — Patient Instructions (Addendum)
 Medication Instructions:  Your physician has recommended you make the following change in your medication:  Increase isosorbide  mononitrate to 30 mg twice daily Continue all other medications as prescribed  Labwork: none  Testing/Procedures: none  Follow-Up: Your physician recommends that you schedule a follow-up appointment in: 4 months  Any Other Special Instructions Will Be Listed Below (If Applicable).  If you need a refill on your cardiac medications before your next appointment, please call your pharmacy.

## 2024-08-08 NOTE — Progress Notes (Signed)
° ° °  Cardiology Office Note  Date: 08/08/2024   ID: Neira, Bentsen February 22, 1946, MRN [id number]  History of Present Illness: Maria Ritter is a 78 y.o. female last seen in August.  She is here for a routine visit.  Reports stable exertional angina and fatigue on current regimen.  We again discussed up titration of Imdur  and she is in agreement today.  She continues to exercise in the maintenance cardiac rehabilitation program twice a week at [facility].  We went over her medications today.  She did have recent lab work which showed LDL 46 on Crestor 10 mg daily.  I reviewed her ECG today which shows normal sinus rhythm with leftward axis and lead motion artifact.  Physical Exam: VS:  BP 138/82 (BP Location: Left Arm)   Pulse 72   Ht 5' 6 (1.676 m)   Wt 184 lb (83.5 kg)   SpO2 93%   BMI 29.70 kg/m , BMI Body mass index is 29.7 kg/m.  Wt Readings from Last 3 Encounters:  08/08/24 184 lb (83.5 kg)  04/25/24 180 lb 9.6 oz (81.9 kg)  02/10/24 180 lb (81.6 kg)    General: Patient appears comfortable at rest. HEENT: Conjunctiva and lids normal. Neck: Supple, no elevated JVP or carotid bruits. Lungs: Clear to auscultation, nonlabored breathing at rest. Cardiac: Regular rate and rhythm, no S3 or significant systolic murmur.  ECG:  An ECG dated 08/18/2023 was personally reviewed today and demonstrated:  Sinus rhythm with increased voltage.  Labwork:  December 2025: TSH 2.48, hemoglobin 13.1, platelets 225, BUN 15, creatinine 0.7, GFR 88, potassium 4.1, AST 49, ALT 31, cholesterol 113, triglycerides 190, HDL 36, LDL 46, hemoglobin A1c 6.3%  Other Studies Reviewed Today:  No interval cardiac testing for review today.  Assessment and Plan:  1.  Ischemic heart disease based on abnormal Lexiscan  Myoview  in December 2024 indicating LAD distribution ischemia.  She does not want to pursue a cardiac catheterization, we have discussed general indications for revascularization  depending on how she does clinically.  At this time trying to focus on basic exercise plan and medical therapy as tolerated.  Continue aspirin 81 mg daily, Crestor 10 mg daily, increase Imdur  to 30 mg twice daily.  She also has as needed nitroglycerin  available.  Recent LDL 46.   2.  Mild carotid atherosclerosis, asymptomatic and without significant progression by carotid Dopplers in July 2023.  Continue aspirin and statin.   3.  Premature atrial contractions, asymptomatic and documented by ECG.  No history of atrial fibrillation.   4.  Primary hypertension.  She continues on lisinopril  40 mg daily and HCTZ 12.5 mg daily with potassium supplement..  No changes made in current regimen.  Disposition:  Follow up 4 months.  Signed, Jayson JUDITHANN Sierras, M.D., F.A.C.C. [facility] at [facility]

## 2024-08-10 ENCOUNTER — Encounter: Payer: Self-pay | Admitting: Internal Medicine

## 2024-08-10 DIAGNOSIS — M51369 Other intervertebral disc degeneration, lumbar region without mention of lumbar back pain or lower extremity pain: Secondary | ICD-10-CM | POA: Diagnosis not present

## 2024-08-10 DIAGNOSIS — E039 Hypothyroidism, unspecified: Secondary | ICD-10-CM | POA: Diagnosis not present

## 2024-08-10 DIAGNOSIS — I10 Essential (primary) hypertension: Secondary | ICD-10-CM | POA: Diagnosis not present

## 2024-08-10 DIAGNOSIS — I2510 Atherosclerotic heart disease of native coronary artery without angina pectoris: Secondary | ICD-10-CM | POA: Diagnosis not present

## 2024-08-10 DIAGNOSIS — R7303 Prediabetes: Secondary | ICD-10-CM | POA: Diagnosis not present

## 2024-08-10 DIAGNOSIS — M25551 Pain in right hip: Secondary | ICD-10-CM | POA: Diagnosis not present

## 2024-08-10 DIAGNOSIS — Z853 Personal history of malignant neoplasm of breast: Secondary | ICD-10-CM | POA: Diagnosis not present

## 2024-08-10 DIAGNOSIS — K7581 Nonalcoholic steatohepatitis (NASH): Secondary | ICD-10-CM | POA: Diagnosis not present

## 2024-08-10 DIAGNOSIS — I6529 Occlusion and stenosis of unspecified carotid artery: Secondary | ICD-10-CM | POA: Diagnosis not present

## 2024-08-10 DIAGNOSIS — M179 Osteoarthritis of knee, unspecified: Secondary | ICD-10-CM | POA: Diagnosis not present

## 2024-08-10 DIAGNOSIS — R600 Localized edema: Secondary | ICD-10-CM | POA: Diagnosis not present

## 2024-08-10 DIAGNOSIS — I259 Chronic ischemic heart disease, unspecified: Secondary | ICD-10-CM | POA: Diagnosis not present

## 2024-08-14 DIAGNOSIS — I739 Peripheral vascular disease, unspecified: Secondary | ICD-10-CM | POA: Diagnosis not present

## 2024-09-06 ENCOUNTER — Emergency Department (HOSPITAL_COMMUNITY)

## 2024-09-06 ENCOUNTER — Other Ambulatory Visit: Payer: Self-pay

## 2024-09-06 ENCOUNTER — Encounter (HOSPITAL_COMMUNITY): Payer: Self-pay

## 2024-09-06 ENCOUNTER — Emergency Department (HOSPITAL_COMMUNITY)
Admission: EM | Admit: 2024-09-06 | Discharge: 2024-09-06 | Disposition: A | Discharge status: Home / Self Care | Attending: Emergency Medicine | Admitting: Emergency Medicine

## 2024-09-06 DIAGNOSIS — Z7982 Long term (current) use of aspirin: Secondary | ICD-10-CM | POA: Insufficient documentation

## 2024-09-06 DIAGNOSIS — W19XXXA Unspecified fall, initial encounter: Secondary | ICD-10-CM

## 2024-09-06 DIAGNOSIS — S5001XA Contusion of right elbow, initial encounter: Secondary | ICD-10-CM | POA: Insufficient documentation

## 2024-09-06 DIAGNOSIS — Z23 Encounter for immunization: Secondary | ICD-10-CM | POA: Diagnosis not present

## 2024-09-06 DIAGNOSIS — M25521 Pain in right elbow: Secondary | ICD-10-CM | POA: Diagnosis present

## 2024-09-06 DIAGNOSIS — S59909A Unspecified injury of unspecified elbow, initial encounter: Secondary | ICD-10-CM

## 2024-09-06 DIAGNOSIS — W0110XA Fall on same level from slipping, tripping and stumbling with subsequent striking against unspecified object, initial encounter: Secondary | ICD-10-CM | POA: Diagnosis not present

## 2024-09-06 LAB — CBC WITH DIFFERENTIAL/PLATELET
Abs Immature Granulocytes: 0.03 K/uL (ref 0.00–0.07)
Basophils Absolute: 0.1 K/uL (ref 0.0–0.1)
Basophils Relative: 1 %
Eosinophils Absolute: 0.2 K/uL (ref 0.0–0.5)
Eosinophils Relative: 2 %
HCT: 37.9 % (ref 36.0–46.0)
Hemoglobin: 12.3 g/dL (ref 12.0–15.0)
Immature Granulocytes: 0 %
Lymphocytes Relative: 22 %
Lymphs Abs: 2 K/uL (ref 0.7–4.0)
MCH: 30.7 pg (ref 26.0–34.0)
MCHC: 32.5 g/dL (ref 30.0–36.0)
MCV: 94.5 fL (ref 80.0–100.0)
Monocytes Absolute: 1 K/uL (ref 0.1–1.0)
Monocytes Relative: 11 %
Neutro Abs: 5.9 K/uL (ref 1.7–7.7)
Neutrophils Relative %: 64 %
Platelets: 256 K/uL (ref 150–400)
RBC: 4.01 MIL/uL (ref 3.87–5.11)
RDW: 13.5 % (ref 11.5–15.5)
WBC: 9.3 K/uL (ref 4.0–10.5)
nRBC: 0 % (ref 0.0–0.2)

## 2024-09-06 LAB — COMPREHENSIVE METABOLIC PANEL WITH GFR
ALT: 38 U/L (ref 0–44)
AST: 60 U/L — ABNORMAL HIGH (ref 15–41)
Albumin: 4.2 g/dL (ref 3.5–5.0)
Alkaline Phosphatase: 56 U/L (ref 38–126)
Anion gap: 9 (ref 5–15)
BUN: 18 mg/dL (ref 8–23)
CO2: 31 mmol/L (ref 22–32)
Calcium: 9.7 mg/dL (ref 8.9–10.3)
Chloride: 99 mmol/L (ref 98–111)
Creatinine, Ser: 0.92 mg/dL (ref 0.44–1.00)
GFR, Estimated: >60 mL/min
Glucose, Bld: 169 mg/dL — ABNORMAL HIGH (ref 70–99)
Potassium: 3.6 mmol/L (ref 3.5–5.1)
Sodium: 139 mmol/L (ref 135–145)
Total Bilirubin: 0.5 mg/dL (ref 0.0–1.2)
Total Protein: 7.6 g/dL (ref 6.5–8.1)

## 2024-09-06 LAB — CBG MONITORING, ED: Glucose-Capillary: 168 mg/dL — ABNORMAL HIGH (ref 70–99)

## 2024-09-06 MED ORDER — TETANUS-DIPHTH-ACELL PERTUSSIS 5-2-15.5 LF-MCG/0.5 IM SUSP
0.5000 mL | Freq: Once | INTRAMUSCULAR | Status: AC
  Administered 2024-09-06: 0.5 mL via INTRAMUSCULAR
  Filled 2024-09-06: qty 0.5

## 2024-09-06 MED ORDER — CEPHALEXIN 500 MG PO CAPS: 500.0000 mg | ORAL_CAPSULE | Freq: Two times a day (BID) | ORAL | 0 refills | Status: AC

## 2024-09-06 MED ORDER — CEPHALEXIN 500 MG PO CAPS
500.0000 mg | ORAL_CAPSULE | Freq: Once | ORAL | Status: AC
  Administered 2024-09-06: 500 mg via ORAL
  Filled 2024-09-06: qty 1

## 2024-09-06 MED ORDER — SODIUM CHLORIDE 0.9 % IV BOLUS
1000.0000 mL | Freq: Once | INTRAVENOUS | Status: AC
  Administered 2024-09-06: 1000 mL via INTRAVENOUS

## 2024-09-06 NOTE — ED Notes (Signed)
 RN applied pressure to active bleed from laceration on right arm. Pt denies dizziness at this time.

## 2024-09-06 NOTE — ED Notes (Signed)
 Patient was being escorted to restroom via wheelchair. Tech was having some difficulty with patient understanding of moving her arm as to not hit it on the doorway. I went to assist tech with patient. At that time patient eyes rolled back, patient made a gurgling sound and passed out in wheelchair. Attempted to arouse patient with sternal rub, patient did not respond. Called [employer] for assistance. Patient taken back to room still not responsive. Gave another sternal rub and  staff attempting to get patient back in bed patient came around.

## 2024-09-06 NOTE — Discharge Instructions (Signed)
 Follow-up with your doctor on Monday for recheck.  If you have any problems prior to that just come here.

## 2024-09-06 NOTE — ED Notes (Signed)
 Wound cleaned and new dressing applied to right arm laceration.

## 2024-09-06 NOTE — ED Triage Notes (Addendum)
 Pt states that she tripped and fell over boards on her porch. Laceration to R arm. Bleeding not controlled. Coband dressing present. No thinners. No LOC.

## 2024-09-09 NOTE — ED Provider Notes (Signed)
 " [place] EMERGENCY DEPARTMENT AT [facility] Provider Note   CSN: [id number] Arrival date & time: 09/06/24  2015     Patient presents with: Maria Ritter is a 79 y.o. female.   Patient had a fall and complains of pain to her right elbow.  The history is provided by the patient and medical records.  Fall This is a new problem. The problem occurs constantly. The problem has not changed since onset.Pertinent negatives include no chest pain, no abdominal pain and no headaches. Nothing aggravates the symptoms. Nothing relieves the symptoms. She has tried nothing for the symptoms.       Prior to Admission medications  Medication Sig Start Date End Date Taking? Authorizing Provider  cephALEXin  (KEFLEX ) 500 MG capsule Take 1 capsule (500 mg total) by mouth 2 (two) times daily. 09/06/24  Yes Quinnlyn Hearns, MD  acetaminophen  (TYLENOL ) 650 MG CR tablet Take 1,300 mg by mouth every 8 (eight) hours as needed for pain.    [provider]  aspirin EC 81 MG tablet Take 81 mg by mouth daily. Swallow whole.    [provider]  Cholecalciferol (VITAMIN D3) 50 MCG (2000 UT) capsule Take 2,000 Units by mouth 2 (two) times daily. Patient not taking: Reported on 08/08/2024    [provider]  fexofenadine  (ALLEGRA  ALLERGY) 180 MG tablet 1 tablet Swallow whole with water ; do not take with fruit juices. Orally Once a day    [provider]  fluticasone  (FLONASE ) 50 MCG/ACT nasal spray Place 2 sprays into both nostrils daily as needed for allergies. 12/14/23   Cari Arlean HERO, FNP  folic acid (FOLVITE) 1 MG tablet Take 1 mg by mouth daily. Patient not taking: Reported on 08/08/2024    [provider]  furosemide (LASIX) 20 MG tablet Take 1 tablet by mouth daily as needed.    [provider]  GLUCOSAMINE-CHONDROITIN PO Take 1 tablet by mouth 2 (two) times daily.     [provider]  hydrochlorothiazide (MICROZIDE) 12.5 MG capsule  Take 12.5 mg by mouth daily.    [provider]  hydrocortisone  2.5 % cream APPLY A THIN LAYER TO THE AFFECTED AREA(S) BY TOPICAL ROUTE 2 TIMES PER DAY Patient not taking: Reported on 08/08/2024    [provider]  isosorbide  mononitrate (IMDUR ) 30 MG 24 hr tablet Take 1 tablet (30 mg total) by mouth in the morning and at bedtime. 08/08/24   Debera Jayson MATSU, MD  ketoconazole (NIZORAL) 2 % cream Apply 1 application topically daily as needed for irritation.    [provider]  levocetirizine (XYZAL) 5 MG tablet Take 1 tablet every day by oral route. 10/27/22   [provider]  levothyroxine  (SYNTHROID ) 137 MCG tablet Take 137 mcg by mouth daily. 02/07/24   [provider]  lisinopril (ZESTRIL) 40 MG tablet Take 40 mg by mouth daily.    [provider]  methocarbamol (ROBAXIN) 500 MG tablet Take 500 mg by mouth at bedtime as needed for muscle spasms. 04/04/19   [provider]  montelukast  (SINGULAIR ) 10 MG tablet Take 10 mg by mouth daily.    [provider]  nitroGLYCERIN  (NITROSTAT ) 0.4 MG SL tablet Place 1 tablet (0.4 mg total) under the tongue every 5 (five) minutes x 3 doses as needed (if no relief after 2nd dose, proceed to ED or call 911). 08/18/23   Debera Jayson MATSU, MD  potassium chloride (KLOR-CON) 10 MEQ tablet Take 10  mEq by mouth daily. 10/03/23   [provider]  psyllium (HYDROCIL/METAMUCIL) 95 % PACK Take 1 packet by mouth daily.    [provider]  raloxifene  (EVISTA ) 60 MG tablet Take 60 mg by mouth daily. 06/08/24   [provider]  rosuvastatin (CRESTOR) 10 MG tablet Take 10 mg by mouth daily. 04/24/20   [provider]  silver  sulfADIAZINE  (SILVADENE ) 1 % cream Apply 1 application topically daily. 06/17/21   Signa Delon LABOR, NP  triamcinolone  ointment (KENALOG ) 0.1 % Apply 1 Application topically 2 (two) times daily. 04/22/22   Iva Marty Saltness, MD  Turmeric 500 MG  CAPS Take 500 mg by mouth daily.    [provider]  vitamin B-12 (CYANOCOBALAMIN) 500 MCG tablet Take 500 mcg by mouth daily.    [provider]  zinc gluconate 50 MG tablet Take 50 mg by mouth daily. Patient not taking: Reported on 08/08/2024    [provider]    Allergies: Mango flavoring agent (non-screening), Diclofenac sodium, Erythromycin, Ivp dye [iodinated contrast media], Penicillamine, Penicillins, Red dye #40 (allura red), Sulfonamide derivatives, Neosporin [bacitracin-polymyxin b], and Tape    Review of Systems  Constitutional:  Negative for appetite change and fatigue.  HENT:  Negative for congestion, ear discharge and sinus pressure.   Eyes:  Negative for discharge.  Respiratory:  Negative for cough.   Cardiovascular:  Negative for chest pain.  Gastrointestinal:  Negative for abdominal pain and diarrhea.  Genitourinary:  Negative for frequency and hematuria.  Musculoskeletal:  Negative for back pain.       Right elbow pain  Skin:  Negative for rash.  Neurological:  Negative for seizures and headaches.  Psychiatric/Behavioral:  Negative for hallucinations.     Updated Vital Signs BP (!) 150/62   Pulse 68   Temp 98.7 F (37.1 C) (Oral)   Resp 18   Ht 5' 6 (1.676 m)   Wt 83.5 kg   SpO2 98%   BMI 29.71 kg/m   Physical Exam Vitals and nursing note reviewed.  Constitutional:      Appearance: She is well-developed.  HENT:     Head: Normocephalic.     Nose: Nose normal.  Eyes:     General: No scleral icterus.    Conjunctiva/sclera: Conjunctivae normal.  Neck:     Thyroid : No thyromegaly.  Cardiovascular:     Rate and Rhythm: Normal rate and regular rhythm.     Heart sounds: No murmur heard.    No friction rub. No gallop.  Pulmonary:     Breath sounds: No stridor. No wheezing or rales.  Chest:     Chest wall: No tenderness.  Abdominal:     General: There is no distension.     Tenderness: There is no abdominal tenderness.  There is no rebound.  Musculoskeletal:        General: Normal range of motion.     Cervical back: Neck supple.     Comments: Tenderness to right elbow with small abrasion  Lymphadenopathy:     Cervical: No cervical adenopathy.  Skin:    Findings: No erythema or rash.  Neurological:     Mental Status: She is oriented to person, place, and time.     Motor: No abnormal muscle tone.     Coordination: Coordination normal.  Psychiatric:        Behavior: Behavior normal.     (all labs ordered are listed, but only abnormal results are displayed) Labs Reviewed  COMPREHENSIVE  METABOLIC PANEL WITH GFR - Abnormal; Notable for the following components:      Result Value   Glucose, Bld 169 (*)    AST 60 (*)    All other components within normal limits  CBG MONITORING, ED - Abnormal; Notable for the following components:   Glucose-Capillary 168 (*)    All other components within normal limits  CBC WITH DIFFERENTIAL/PLATELET    EKG: EKG Interpretation Date/Time:  Thursday September 06 2024 21:07:59 EST Ventricular Rate:  67 PR Interval:  155 QRS Duration:  93 QT Interval:  372 QTC Calculation: 393 R Axis:   20  Text Interpretation: Sinus rhythm Borderline repolarization abnormality Baseline wander in lead(s) II No significant change since last tracing Confirmed by Dasie Faden ([id number]) on 09/07/2024 10:11:02 AM  Radiology: No results found.   Procedures   Medications Ordered in the ED  Tdap (ADACEL ) injection 0.5 mL (0.5 mLs Intramuscular Given 09/06/24 2129)  sodium chloride  0.9 % bolus 1,000 mL (0 mLs Intravenous Stopped 09/06/24 2240)  cephALEXin  (KEFLEX ) capsule 500 mg (500 mg Oral Given 09/06/24 2335)                                    Medical Decision Making Amount and/or Complexity of Data Reviewed Labs: ordered. Radiology: ordered. ECG/medicine tests: ordered.  Risk Prescription drug management.   Contusion and abrasion to right elbow.  Patient will take Keflex  to  prevent infection and will follow-up with PCP     Final diagnoses:  Fall, initial encounter  Elbow injury, initial encounter    ED Discharge Orders          Ordered    cephALEXin  (KEFLEX ) 500 MG capsule  2 times daily        09/06/24 2311               Suzette Pac, MD 09/09/24 1041  "

## 2024-11-20 ENCOUNTER — Ambulatory Visit: Payer: Self-pay | Admitting: Cardiology
